# Patient Record
Sex: Female | Born: 1980 | Race: White | Hispanic: No | Smoking: Current every day smoker
Health system: Southern US, Community
[De-identification: ages and names within clinical notes are randomized; demographics above are authoritative.]

## PROBLEM LIST (undated history)

## (undated) DIAGNOSIS — IMO0002 Reserved for concepts with insufficient information to code with codable children: Secondary | ICD-10-CM

## (undated) DIAGNOSIS — F410 Panic disorder [episodic paroxysmal anxiety] without agoraphobia: Secondary | ICD-10-CM

## (undated) DIAGNOSIS — N200 Calculus of kidney: Secondary | ICD-10-CM

## (undated) DIAGNOSIS — G8929 Other chronic pain: Secondary | ICD-10-CM

## (undated) DIAGNOSIS — R102 Pelvic and perineal pain unspecified side: Secondary | ICD-10-CM

## (undated) DIAGNOSIS — S0990XA Unspecified injury of head, initial encounter: Secondary | ICD-10-CM

## (undated) DIAGNOSIS — N289 Disorder of kidney and ureter, unspecified: Secondary | ICD-10-CM

## (undated) DIAGNOSIS — F32A Depression, unspecified: Secondary | ICD-10-CM

## (undated) DIAGNOSIS — F419 Anxiety disorder, unspecified: Secondary | ICD-10-CM

## (undated) DIAGNOSIS — F319 Bipolar disorder, unspecified: Secondary | ICD-10-CM

## (undated) DIAGNOSIS — F4310 Post-traumatic stress disorder, unspecified: Secondary | ICD-10-CM

## (undated) DIAGNOSIS — F1910 Other psychoactive substance abuse, uncomplicated: Secondary | ICD-10-CM

## (undated) DIAGNOSIS — F329 Major depressive disorder, single episode, unspecified: Secondary | ICD-10-CM

## (undated) HISTORY — PX: KIDNEY STONE SURGERY: SHX686

## (undated) HISTORY — PX: LEEP: SHX91

---

## 2001-01-08 ENCOUNTER — Emergency Department (HOSPITAL_COMMUNITY): Admission: EM | Admit: 2001-01-08 | Discharge: 2001-01-08 | Payer: Self-pay | Admitting: Internal Medicine

## 2001-07-11 ENCOUNTER — Emergency Department (HOSPITAL_COMMUNITY): Admission: EM | Admit: 2001-07-11 | Discharge: 2001-07-11 | Payer: Self-pay | Admitting: Emergency Medicine

## 2001-08-25 ENCOUNTER — Emergency Department (HOSPITAL_COMMUNITY): Admission: EM | Admit: 2001-08-25 | Discharge: 2001-08-25 | Payer: Self-pay | Admitting: *Deleted

## 2001-09-06 ENCOUNTER — Emergency Department (HOSPITAL_COMMUNITY): Admission: EM | Admit: 2001-09-06 | Discharge: 2001-09-07 | Payer: Self-pay | Admitting: Emergency Medicine

## 2001-09-07 ENCOUNTER — Emergency Department (HOSPITAL_COMMUNITY): Admission: EM | Admit: 2001-09-07 | Discharge: 2001-09-07 | Payer: Self-pay | Admitting: Emergency Medicine

## 2001-10-10 ENCOUNTER — Emergency Department (HOSPITAL_COMMUNITY): Admission: EM | Admit: 2001-10-10 | Discharge: 2001-10-10 | Payer: Self-pay | Admitting: Emergency Medicine

## 2001-12-26 ENCOUNTER — Emergency Department (HOSPITAL_COMMUNITY): Admission: EM | Admit: 2001-12-26 | Discharge: 2001-12-26 | Payer: Self-pay | Admitting: *Deleted

## 2002-01-02 ENCOUNTER — Emergency Department (HOSPITAL_COMMUNITY): Admission: EM | Admit: 2002-01-02 | Discharge: 2002-01-02 | Payer: Self-pay | Admitting: Emergency Medicine

## 2002-03-11 ENCOUNTER — Emergency Department (HOSPITAL_COMMUNITY): Admission: EM | Admit: 2002-03-11 | Discharge: 2002-03-11 | Payer: Self-pay | Admitting: Emergency Medicine

## 2002-09-30 ENCOUNTER — Emergency Department (HOSPITAL_COMMUNITY): Admission: EM | Admit: 2002-09-30 | Discharge: 2002-10-01 | Payer: Self-pay | Admitting: Emergency Medicine

## 2003-01-05 ENCOUNTER — Encounter: Payer: Self-pay | Admitting: Emergency Medicine

## 2003-01-05 ENCOUNTER — Emergency Department (HOSPITAL_COMMUNITY): Admission: EM | Admit: 2003-01-05 | Discharge: 2003-01-05 | Payer: Self-pay | Admitting: Emergency Medicine

## 2003-03-13 ENCOUNTER — Encounter: Payer: Self-pay | Admitting: Emergency Medicine

## 2003-03-13 ENCOUNTER — Emergency Department (HOSPITAL_COMMUNITY): Admission: EM | Admit: 2003-03-13 | Discharge: 2003-03-13 | Payer: Self-pay | Admitting: Emergency Medicine

## 2003-06-30 ENCOUNTER — Emergency Department (HOSPITAL_COMMUNITY): Admission: EM | Admit: 2003-06-30 | Discharge: 2003-06-30 | Payer: Self-pay | Admitting: *Deleted

## 2003-12-18 ENCOUNTER — Emergency Department (HOSPITAL_COMMUNITY): Admission: EM | Admit: 2003-12-18 | Discharge: 2003-12-18 | Payer: Self-pay | Admitting: Emergency Medicine

## 2004-02-21 ENCOUNTER — Emergency Department (HOSPITAL_COMMUNITY): Admission: EM | Admit: 2004-02-21 | Discharge: 2004-02-22 | Payer: Self-pay | Admitting: Emergency Medicine

## 2004-02-27 ENCOUNTER — Emergency Department (HOSPITAL_COMMUNITY): Admission: EM | Admit: 2004-02-27 | Discharge: 2004-02-27 | Payer: Self-pay | Admitting: *Deleted

## 2004-03-05 ENCOUNTER — Emergency Department (HOSPITAL_COMMUNITY): Admission: EM | Admit: 2004-03-05 | Discharge: 2004-03-05 | Payer: Self-pay | Admitting: *Deleted

## 2005-06-26 ENCOUNTER — Emergency Department (HOSPITAL_COMMUNITY): Admission: EM | Admit: 2005-06-26 | Discharge: 2005-06-26 | Payer: Self-pay | Admitting: Emergency Medicine

## 2005-11-20 ENCOUNTER — Ambulatory Visit: Payer: Self-pay | Admitting: Orthopedic Surgery

## 2006-04-25 ENCOUNTER — Emergency Department (HOSPITAL_COMMUNITY): Admission: EM | Admit: 2006-04-25 | Discharge: 2006-04-25 | Payer: Self-pay | Admitting: Emergency Medicine

## 2006-06-23 ENCOUNTER — Emergency Department (HOSPITAL_COMMUNITY): Admission: EM | Admit: 2006-06-23 | Discharge: 2006-06-23 | Payer: Self-pay | Admitting: Emergency Medicine

## 2006-09-14 ENCOUNTER — Emergency Department (HOSPITAL_COMMUNITY): Admission: EM | Admit: 2006-09-14 | Discharge: 2006-09-14 | Payer: Self-pay | Admitting: Emergency Medicine

## 2006-11-06 ENCOUNTER — Emergency Department (HOSPITAL_COMMUNITY): Admission: EM | Admit: 2006-11-06 | Discharge: 2006-11-06 | Payer: Self-pay | Admitting: Emergency Medicine

## 2006-11-08 ENCOUNTER — Emergency Department (HOSPITAL_COMMUNITY): Admission: EM | Admit: 2006-11-08 | Discharge: 2006-11-08 | Payer: Self-pay | Admitting: Emergency Medicine

## 2006-11-10 ENCOUNTER — Other Ambulatory Visit: Admission: RE | Admit: 2006-11-10 | Discharge: 2006-11-10 | Payer: Self-pay | Admitting: Unknown Physician Specialty

## 2006-11-10 ENCOUNTER — Encounter (INDEPENDENT_AMBULATORY_CARE_PROVIDER_SITE_OTHER): Payer: Self-pay | Admitting: Specialist

## 2007-01-19 ENCOUNTER — Emergency Department (HOSPITAL_COMMUNITY): Admission: EM | Admit: 2007-01-19 | Discharge: 2007-01-19 | Payer: Self-pay | Admitting: Emergency Medicine

## 2007-03-10 ENCOUNTER — Emergency Department (HOSPITAL_COMMUNITY): Admission: EM | Admit: 2007-03-10 | Discharge: 2007-03-10 | Payer: Self-pay | Admitting: Emergency Medicine

## 2007-04-17 ENCOUNTER — Emergency Department (HOSPITAL_COMMUNITY): Admission: EM | Admit: 2007-04-17 | Discharge: 2007-04-17 | Payer: Self-pay | Admitting: Emergency Medicine

## 2007-07-12 ENCOUNTER — Emergency Department (HOSPITAL_COMMUNITY): Admission: EM | Admit: 2007-07-12 | Discharge: 2007-07-13 | Payer: Self-pay | Admitting: Emergency Medicine

## 2007-07-12 ENCOUNTER — Emergency Department (HOSPITAL_COMMUNITY): Admission: EM | Admit: 2007-07-12 | Discharge: 2007-07-12 | Payer: Self-pay | Admitting: Emergency Medicine

## 2007-08-02 ENCOUNTER — Emergency Department (HOSPITAL_COMMUNITY): Admission: EM | Admit: 2007-08-02 | Discharge: 2007-08-02 | Payer: Self-pay | Admitting: Emergency Medicine

## 2007-10-03 ENCOUNTER — Emergency Department (HOSPITAL_COMMUNITY): Admission: EM | Admit: 2007-10-03 | Discharge: 2007-10-03 | Payer: Self-pay | Admitting: Emergency Medicine

## 2007-10-19 ENCOUNTER — Emergency Department (HOSPITAL_COMMUNITY): Admission: EM | Admit: 2007-10-19 | Discharge: 2007-10-19 | Payer: Self-pay | Admitting: Emergency Medicine

## 2007-10-21 ENCOUNTER — Emergency Department (HOSPITAL_COMMUNITY): Admission: EM | Admit: 2007-10-21 | Discharge: 2007-10-21 | Payer: Self-pay | Admitting: Emergency Medicine

## 2008-01-03 ENCOUNTER — Emergency Department (HOSPITAL_COMMUNITY): Admission: EM | Admit: 2008-01-03 | Discharge: 2008-01-04 | Payer: Self-pay | Admitting: Emergency Medicine

## 2008-05-01 ENCOUNTER — Emergency Department (HOSPITAL_COMMUNITY): Admission: EM | Admit: 2008-05-01 | Discharge: 2008-05-01 | Payer: Self-pay | Admitting: Emergency Medicine

## 2008-05-31 ENCOUNTER — Emergency Department (HOSPITAL_COMMUNITY): Admission: EM | Admit: 2008-05-31 | Discharge: 2008-05-31 | Payer: Self-pay | Admitting: Emergency Medicine

## 2008-06-15 ENCOUNTER — Emergency Department (HOSPITAL_COMMUNITY): Admission: EM | Admit: 2008-06-15 | Discharge: 2008-06-15 | Payer: Self-pay | Admitting: Emergency Medicine

## 2008-06-20 ENCOUNTER — Emergency Department (HOSPITAL_COMMUNITY): Admission: EM | Admit: 2008-06-20 | Discharge: 2008-06-20 | Payer: Self-pay | Admitting: Emergency Medicine

## 2008-07-10 ENCOUNTER — Emergency Department (HOSPITAL_COMMUNITY): Admission: EM | Admit: 2008-07-10 | Discharge: 2008-07-10 | Payer: Self-pay | Admitting: Emergency Medicine

## 2008-09-25 ENCOUNTER — Emergency Department (HOSPITAL_COMMUNITY): Admission: EM | Admit: 2008-09-25 | Discharge: 2008-09-26 | Payer: Self-pay | Admitting: Emergency Medicine

## 2009-04-30 ENCOUNTER — Emergency Department (HOSPITAL_COMMUNITY): Admission: EM | Admit: 2009-04-30 | Discharge: 2009-04-30 | Payer: Self-pay | Admitting: Emergency Medicine

## 2009-05-28 ENCOUNTER — Emergency Department (HOSPITAL_COMMUNITY): Admission: EM | Admit: 2009-05-28 | Discharge: 2009-05-28 | Payer: Self-pay | Admitting: Emergency Medicine

## 2009-08-20 ENCOUNTER — Emergency Department (HOSPITAL_COMMUNITY): Admission: EM | Admit: 2009-08-20 | Discharge: 2009-08-20 | Payer: Self-pay | Admitting: Emergency Medicine

## 2009-09-03 ENCOUNTER — Emergency Department (HOSPITAL_COMMUNITY): Admission: EM | Admit: 2009-09-03 | Discharge: 2009-09-03 | Payer: Self-pay | Admitting: Emergency Medicine

## 2009-09-19 ENCOUNTER — Emergency Department (HOSPITAL_COMMUNITY): Admission: EM | Admit: 2009-09-19 | Discharge: 2009-09-20 | Payer: Self-pay | Admitting: Emergency Medicine

## 2009-10-11 ENCOUNTER — Emergency Department (HOSPITAL_COMMUNITY): Admission: EM | Admit: 2009-10-11 | Discharge: 2009-10-11 | Payer: Self-pay | Admitting: Emergency Medicine

## 2009-11-15 ENCOUNTER — Emergency Department (HOSPITAL_COMMUNITY): Admission: EM | Admit: 2009-11-15 | Discharge: 2009-11-15 | Payer: Self-pay | Admitting: Emergency Medicine

## 2009-11-16 ENCOUNTER — Ambulatory Visit (HOSPITAL_COMMUNITY): Admission: RE | Admit: 2009-11-16 | Discharge: 2009-11-16 | Payer: Self-pay | Admitting: Emergency Medicine

## 2010-01-07 ENCOUNTER — Emergency Department (HOSPITAL_COMMUNITY): Admission: EM | Admit: 2010-01-07 | Discharge: 2010-01-07 | Payer: Self-pay | Admitting: Emergency Medicine

## 2010-01-21 ENCOUNTER — Emergency Department (HOSPITAL_COMMUNITY): Admission: EM | Admit: 2010-01-21 | Discharge: 2010-01-22 | Payer: Self-pay | Admitting: Emergency Medicine

## 2010-02-05 ENCOUNTER — Other Ambulatory Visit: Admission: RE | Admit: 2010-02-05 | Discharge: 2010-02-05 | Payer: Self-pay | Admitting: Unknown Physician Specialty

## 2010-05-09 ENCOUNTER — Emergency Department (HOSPITAL_COMMUNITY): Admission: EM | Admit: 2010-05-09 | Discharge: 2010-05-09 | Payer: Self-pay | Admitting: Internal Medicine

## 2010-05-12 ENCOUNTER — Emergency Department (HOSPITAL_COMMUNITY): Admission: EM | Admit: 2010-05-12 | Discharge: 2010-05-13 | Payer: Self-pay | Admitting: Emergency Medicine

## 2010-05-28 ENCOUNTER — Emergency Department (HOSPITAL_COMMUNITY): Admission: EM | Admit: 2010-05-28 | Discharge: 2010-05-28 | Payer: Self-pay | Admitting: Emergency Medicine

## 2010-09-24 LAB — URINALYSIS, ROUTINE W REFLEX MICROSCOPIC
Bilirubin Urine: NEGATIVE
Nitrite: NEGATIVE
Specific Gravity, Urine: 1.015 (ref 1.005–1.030)
Urobilinogen, UA: 0.2 mg/dL (ref 0.0–1.0)

## 2010-09-25 LAB — WET PREP, GENITAL
Trich, Wet Prep: NONE SEEN
WBC, Wet Prep HPF POC: NONE SEEN
Yeast Wet Prep HPF POC: NONE SEEN

## 2010-09-25 LAB — URINALYSIS, ROUTINE W REFLEX MICROSCOPIC
Bilirubin Urine: NEGATIVE
Glucose, UA: NEGATIVE mg/dL
Hgb urine dipstick: NEGATIVE
Ketones, ur: NEGATIVE mg/dL
Ketones, ur: NEGATIVE mg/dL
Nitrite: NEGATIVE
Nitrite: NEGATIVE
Protein, ur: NEGATIVE mg/dL
Protein, ur: NEGATIVE mg/dL
Specific Gravity, Urine: >1.03 — ABNORMAL HIGH (ref 1.005–1.030)
Urobilinogen, UA: 0.2 mg/dL (ref 0.0–1.0)
pH: 6 (ref 5.0–8.0)

## 2010-09-25 LAB — GC/CHLAMYDIA PROBE AMP, GENITAL
Chlamydia, DNA Probe: NEGATIVE
GC Probe Amp, Genital: NEGATIVE

## 2010-09-29 LAB — WET PREP, GENITAL: Trich, Wet Prep: NONE SEEN

## 2010-09-29 LAB — URINALYSIS, ROUTINE W REFLEX MICROSCOPIC
Bilirubin Urine: NEGATIVE
Glucose, UA: NEGATIVE mg/dL
Hgb urine dipstick: NEGATIVE
Ketones, ur: NEGATIVE mg/dL
Protein, ur: NEGATIVE mg/dL

## 2010-10-01 LAB — URINALYSIS, ROUTINE W REFLEX MICROSCOPIC
Glucose, UA: NEGATIVE mg/dL
Ketones, ur: NEGATIVE mg/dL
Nitrite: NEGATIVE
Protein, ur: NEGATIVE mg/dL

## 2010-10-01 LAB — WET PREP, GENITAL

## 2010-10-01 LAB — POCT PREGNANCY, URINE: Preg Test, Ur: NEGATIVE

## 2010-10-02 LAB — URINALYSIS, ROUTINE W REFLEX MICROSCOPIC
Bilirubin Urine: NEGATIVE
Hgb urine dipstick: NEGATIVE
Specific Gravity, Urine: 1.02 (ref 1.005–1.030)
Urobilinogen, UA: 1 mg/dL (ref 0.0–1.0)

## 2010-10-02 LAB — DIFFERENTIAL
Basophils Absolute: 0 10*3/uL (ref 0.0–0.1)
Basophils Relative: 1 % (ref 0–1)
Eosinophils Relative: 3 % (ref 0–5)
Monocytes Absolute: 0.5 10*3/uL (ref 0.1–1.0)
Monocytes Relative: 7 % (ref 3–12)

## 2010-10-02 LAB — CBC
HCT: 36 % (ref 36.0–46.0)
Hemoglobin: 12.2 g/dL (ref 12.0–15.0)
MCHC: 34 g/dL (ref 30.0–36.0)
RDW: 13 % (ref 11.5–15.5)

## 2010-10-02 LAB — BASIC METABOLIC PANEL
CO2: 27 mEq/L (ref 19–32)
Chloride: 106 mEq/L (ref 96–112)
Glucose, Bld: 87 mg/dL (ref 70–99)
Potassium: 3.5 mEq/L (ref 3.5–5.1)
Sodium: 139 mEq/L (ref 135–145)

## 2010-10-02 LAB — URINE CULTURE
Colony Count: NO GROWTH
Culture: NO GROWTH

## 2010-10-02 LAB — WET PREP, GENITAL: Yeast Wet Prep HPF POC: NONE SEEN

## 2010-10-02 LAB — PREGNANCY, URINE: Preg Test, Ur: NEGATIVE

## 2010-10-04 LAB — CBC
MCV: 94.5 fL (ref 78.0–100.0)
Platelets: 256 10*3/uL (ref 150–400)
RDW: 13.5 % (ref 11.5–15.5)
WBC: 6.4 10*3/uL (ref 4.0–10.5)

## 2010-10-04 LAB — URINALYSIS, ROUTINE W REFLEX MICROSCOPIC
Bilirubin Urine: NEGATIVE
Ketones, ur: NEGATIVE mg/dL
Leukocytes, UA: NEGATIVE
Nitrite: NEGATIVE
Protein, ur: NEGATIVE mg/dL
Urobilinogen, UA: 0.2 mg/dL (ref 0.0–1.0)
pH: 5.5 (ref 5.0–8.0)

## 2010-10-04 LAB — WET PREP, GENITAL
Trich, Wet Prep: NONE SEEN
WBC, Wet Prep HPF POC: NONE SEEN
Yeast Wet Prep HPF POC: NONE SEEN

## 2010-10-04 LAB — BASIC METABOLIC PANEL
BUN: 14 mg/dL (ref 6–23)
Chloride: 107 mEq/L (ref 96–112)
Creatinine, Ser: 0.76 mg/dL (ref 0.4–1.2)
GFR calc non Af Amer: >60 mL/min (ref >60)
Glucose, Bld: 96 mg/dL (ref 70–99)

## 2010-10-04 LAB — DIFFERENTIAL
Basophils Absolute: 0 10*3/uL (ref 0.0–0.1)
Eosinophils Absolute: 0.2 10*3/uL (ref 0.0–0.7)
Eosinophils Relative: 2 % (ref 0–5)
Lymphs Abs: 2.7 10*3/uL (ref 0.7–4.0)
Neutrophils Relative %: 48 % (ref 43–77)

## 2010-10-04 LAB — PREGNANCY, URINE: Preg Test, Ur: NEGATIVE

## 2010-10-04 LAB — URINE MICROSCOPIC-ADD ON

## 2010-10-04 LAB — RAPID STREP SCREEN (MED CTR MEBANE ONLY): Streptococcus, Group A Screen (Direct): NEGATIVE

## 2010-10-17 ENCOUNTER — Emergency Department (HOSPITAL_COMMUNITY)
Admission: EM | Admit: 2010-10-17 | Discharge: 2010-10-18 | Disposition: A | Discharge status: Home / Self Care | Payer: Self-pay | Attending: Emergency Medicine | Admitting: Emergency Medicine

## 2010-10-17 DIAGNOSIS — M549 Dorsalgia, unspecified: Secondary | ICD-10-CM | POA: Insufficient documentation

## 2010-10-17 DIAGNOSIS — R109 Unspecified abdominal pain: Secondary | ICD-10-CM | POA: Insufficient documentation

## 2010-10-17 DIAGNOSIS — N739 Female pelvic inflammatory disease, unspecified: Secondary | ICD-10-CM | POA: Insufficient documentation

## 2010-10-17 DIAGNOSIS — F17200 Nicotine dependence, unspecified, uncomplicated: Secondary | ICD-10-CM | POA: Insufficient documentation

## 2010-10-17 LAB — URINALYSIS, ROUTINE W REFLEX MICROSCOPIC
Bilirubin Urine: NEGATIVE
Glucose, UA: NEGATIVE mg/dL
Hgb urine dipstick: NEGATIVE
Hgb urine dipstick: NEGATIVE
Ketones, ur: NEGATIVE mg/dL
Ketones, ur: NEGATIVE mg/dL
Nitrite: NEGATIVE
Protein, ur: NEGATIVE mg/dL
Specific Gravity, Urine: >1.03 — ABNORMAL HIGH (ref 1.005–1.030)
Urobilinogen, UA: 1 mg/dL (ref 0.0–1.0)
Urobilinogen, UA: 0.2 mg/dL (ref 0.0–1.0)
pH: 6 (ref 5.0–8.0)

## 2010-10-17 LAB — BASIC METABOLIC PANEL
BUN: 14 mg/dL (ref 6–23)
Chloride: 104 mEq/L (ref 96–112)
Creatinine, Ser: 0.67 mg/dL (ref 0.4–1.2)
Glucose, Bld: 97 mg/dL (ref 70–99)
Potassium: 3.9 mEq/L (ref 3.5–5.1)

## 2010-10-17 LAB — CBC
HCT: 37.2 % (ref 36.0–46.0)
MCH: 32.5 pg (ref 26.0–34.0)
MCV: 96.6 fL (ref 78.0–100.0)
RBC: 3.85 MIL/uL — ABNORMAL LOW (ref 3.87–5.11)
WBC: 7.1 10*3/uL (ref 4.0–10.5)

## 2010-10-17 LAB — DIFFERENTIAL
Eosinophils Absolute: 0.2 10*3/uL (ref 0.0–0.7)
Eosinophils Relative: 3 % (ref 0–5)
Lymphocytes Relative: 45 % (ref 12–46)
Lymphs Abs: 3.2 10*3/uL (ref 0.7–4.0)
Monocytes Relative: 5 % (ref 3–12)
Neutrophils Relative %: 47 % (ref 43–77)

## 2010-10-17 LAB — PREGNANCY, URINE: Preg Test, Ur: NEGATIVE

## 2010-10-17 LAB — WET PREP, GENITAL

## 2010-10-18 ENCOUNTER — Emergency Department (HOSPITAL_COMMUNITY)
Admission: EM | Admit: 2010-10-18 | Discharge: 2010-10-19 | Disposition: A | Discharge status: Home / Self Care | Payer: Self-pay | Attending: Emergency Medicine | Admitting: Emergency Medicine

## 2010-10-18 DIAGNOSIS — F341 Dysthymic disorder: Secondary | ICD-10-CM | POA: Insufficient documentation

## 2010-10-18 DIAGNOSIS — R109 Unspecified abdominal pain: Secondary | ICD-10-CM | POA: Insufficient documentation

## 2010-10-18 DIAGNOSIS — J45909 Unspecified asthma, uncomplicated: Secondary | ICD-10-CM | POA: Insufficient documentation

## 2010-10-18 DIAGNOSIS — N739 Female pelvic inflammatory disease, unspecified: Secondary | ICD-10-CM | POA: Insufficient documentation

## 2010-10-18 DIAGNOSIS — R10819 Abdominal tenderness, unspecified site: Secondary | ICD-10-CM | POA: Insufficient documentation

## 2010-10-18 DIAGNOSIS — F319 Bipolar disorder, unspecified: Secondary | ICD-10-CM | POA: Insufficient documentation

## 2010-10-18 DIAGNOSIS — Z79899 Other long term (current) drug therapy: Secondary | ICD-10-CM | POA: Insufficient documentation

## 2010-10-21 LAB — GC/CHLAMYDIA PROBE AMP, GENITAL
Chlamydia, DNA Probe: NEGATIVE
GC Probe Amp, Genital: NEGATIVE

## 2010-10-24 LAB — URINE CULTURE
Colony Count: NO GROWTH
Culture: NO GROWTH

## 2010-10-24 LAB — URINE MICROSCOPIC-ADD ON

## 2010-10-24 LAB — WET PREP, GENITAL
Trich, Wet Prep: NONE SEEN
Yeast Wet Prep HPF POC: NONE SEEN

## 2010-10-24 LAB — URINALYSIS, ROUTINE W REFLEX MICROSCOPIC
Bilirubin Urine: NEGATIVE
Glucose, UA: NEGATIVE mg/dL
Ketones, ur: NEGATIVE mg/dL
Leukocytes, UA: NEGATIVE
Nitrite: NEGATIVE
Protein, ur: NEGATIVE mg/dL
Specific Gravity, Urine: >1.03 — ABNORMAL HIGH (ref 1.005–1.030)
Urobilinogen, UA: 0.2 mg/dL (ref 0.0–1.0)
pH: 6 (ref 5.0–8.0)

## 2010-10-24 LAB — GC/CHLAMYDIA PROBE AMP, GENITAL
Chlamydia, DNA Probe: NEGATIVE
GC Probe Amp, Genital: NEGATIVE

## 2010-10-24 LAB — HEMOGLOBIN AND HEMATOCRIT, BLOOD
HCT: 37.5 % (ref 36.0–46.0)
Hemoglobin: 13 g/dL (ref 12.0–15.0)

## 2010-10-24 LAB — PREGNANCY, URINE: Preg Test, Ur: NEGATIVE

## 2010-10-26 ENCOUNTER — Emergency Department (HOSPITAL_COMMUNITY): Payer: Self-pay

## 2010-10-26 ENCOUNTER — Emergency Department (HOSPITAL_COMMUNITY)
Admission: EM | Admit: 2010-10-26 | Discharge: 2010-10-26 | Disposition: A | Discharge status: Home / Self Care | Payer: Self-pay | Attending: Emergency Medicine | Admitting: Emergency Medicine

## 2010-10-26 DIAGNOSIS — J45909 Unspecified asthma, uncomplicated: Secondary | ICD-10-CM | POA: Insufficient documentation

## 2010-10-26 DIAGNOSIS — K644 Residual hemorrhoidal skin tags: Secondary | ICD-10-CM | POA: Insufficient documentation

## 2010-10-26 DIAGNOSIS — R109 Unspecified abdominal pain: Secondary | ICD-10-CM | POA: Insufficient documentation

## 2010-10-26 DIAGNOSIS — F341 Dysthymic disorder: Secondary | ICD-10-CM | POA: Insufficient documentation

## 2010-10-26 DIAGNOSIS — L02419 Cutaneous abscess of limb, unspecified: Secondary | ICD-10-CM | POA: Insufficient documentation

## 2010-10-26 DIAGNOSIS — F319 Bipolar disorder, unspecified: Secondary | ICD-10-CM | POA: Insufficient documentation

## 2010-10-26 DIAGNOSIS — G8929 Other chronic pain: Secondary | ICD-10-CM | POA: Insufficient documentation

## 2010-10-26 DIAGNOSIS — L03119 Cellulitis of unspecified part of limb: Secondary | ICD-10-CM | POA: Insufficient documentation

## 2010-10-26 DIAGNOSIS — Z79899 Other long term (current) drug therapy: Secondary | ICD-10-CM | POA: Insufficient documentation

## 2010-10-26 DIAGNOSIS — R110 Nausea: Secondary | ICD-10-CM | POA: Insufficient documentation

## 2010-10-26 DIAGNOSIS — N949 Unspecified condition associated with female genital organs and menstrual cycle: Secondary | ICD-10-CM | POA: Insufficient documentation

## 2010-10-26 LAB — CBC
HCT: 37.5 % (ref 36.0–46.0)
Hemoglobin: 12.4 g/dL (ref 12.0–15.0)
MCHC: 33.1 g/dL (ref 30.0–36.0)
MCV: 96.9 fL (ref 78.0–100.0)
RDW: 13.8 % (ref 11.5–15.5)

## 2010-10-26 LAB — COMPREHENSIVE METABOLIC PANEL
Alkaline Phosphatase: 65 U/L (ref 39–117)
BUN: 15 mg/dL (ref 6–23)
CO2: 28 mEq/L (ref 19–32)
GFR calc non Af Amer: >60 mL/min (ref >60)
Glucose, Bld: 84 mg/dL (ref 70–99)
Potassium: 3.9 mEq/L (ref 3.5–5.1)
Total Protein: 6.3 g/dL (ref 6.0–8.3)

## 2010-10-26 LAB — URINALYSIS, ROUTINE W REFLEX MICROSCOPIC
Bilirubin Urine: NEGATIVE
Glucose, UA: NEGATIVE mg/dL
Hgb urine dipstick: NEGATIVE
pH: 6 (ref 5.0–8.0)

## 2010-10-26 LAB — LIPASE, BLOOD: Lipase: 34 U/L (ref 11–59)

## 2010-10-26 LAB — DIFFERENTIAL
Eosinophils Relative: 2 % (ref 0–5)
Lymphocytes Relative: 33 % (ref 12–46)
Lymphs Abs: 2.7 10*3/uL (ref 0.7–4.0)
Monocytes Absolute: 0.6 10*3/uL (ref 0.1–1.0)

## 2010-10-26 MED ORDER — IOHEXOL 300 MG/ML  SOLN
100.0000 mL | Freq: Once | INTRAMUSCULAR | Status: AC | PRN
  Administered 2010-10-26: 100 mL via INTRAVENOUS

## 2010-10-29 ENCOUNTER — Emergency Department (HOSPITAL_COMMUNITY)
Admission: EM | Admit: 2010-10-29 | Discharge: 2010-10-29 | Disposition: A | Discharge status: Home / Self Care | Payer: Self-pay | Attending: Emergency Medicine | Admitting: Emergency Medicine

## 2010-10-29 DIAGNOSIS — G8929 Other chronic pain: Secondary | ICD-10-CM | POA: Insufficient documentation

## 2010-10-29 DIAGNOSIS — R109 Unspecified abdominal pain: Secondary | ICD-10-CM | POA: Insufficient documentation

## 2010-10-29 LAB — PREGNANCY, URINE: Preg Test, Ur: NEGATIVE

## 2010-10-29 LAB — URINALYSIS, ROUTINE W REFLEX MICROSCOPIC
Glucose, UA: NEGATIVE mg/dL
Ketones, ur: NEGATIVE mg/dL
Nitrite: NEGATIVE
Specific Gravity, Urine: 1.025 (ref 1.005–1.030)
pH: 6 (ref 5.0–8.0)

## 2010-10-29 LAB — RAPID URINE DRUG SCREEN, HOSP PERFORMED
Barbiturates: NOT DETECTED
Benzodiazepines: POSITIVE — AB

## 2010-11-13 ENCOUNTER — Emergency Department (HOSPITAL_COMMUNITY)
Admission: EM | Admit: 2010-11-13 | Discharge: 2010-11-13 | Disposition: A | Discharge status: Home / Self Care | Payer: Self-pay | Attending: Emergency Medicine | Admitting: Emergency Medicine

## 2010-11-13 DIAGNOSIS — X58XXXA Exposure to other specified factors, initial encounter: Secondary | ICD-10-CM | POA: Insufficient documentation

## 2010-11-13 DIAGNOSIS — F3130 Bipolar disorder, current episode depressed, mild or moderate severity, unspecified: Secondary | ICD-10-CM | POA: Insufficient documentation

## 2010-11-13 DIAGNOSIS — IMO0002 Reserved for concepts with insufficient information to code with codable children: Secondary | ICD-10-CM | POA: Insufficient documentation

## 2010-11-13 DIAGNOSIS — Z79899 Other long term (current) drug therapy: Secondary | ICD-10-CM | POA: Insufficient documentation

## 2010-11-13 DIAGNOSIS — F411 Generalized anxiety disorder: Secondary | ICD-10-CM | POA: Insufficient documentation

## 2010-11-13 DIAGNOSIS — J45909 Unspecified asthma, uncomplicated: Secondary | ICD-10-CM | POA: Insufficient documentation

## 2010-11-13 LAB — URINALYSIS, ROUTINE W REFLEX MICROSCOPIC
Bilirubin Urine: NEGATIVE
Glucose, UA: NEGATIVE mg/dL
Ketones, ur: NEGATIVE mg/dL
Protein, ur: NEGATIVE mg/dL
Urobilinogen, UA: 0.2 mg/dL (ref 0.0–1.0)

## 2010-11-13 LAB — PREGNANCY, URINE: Preg Test, Ur: NEGATIVE

## 2010-11-13 LAB — WET PREP, GENITAL
Trich, Wet Prep: NONE SEEN
Yeast Wet Prep HPF POC: NONE SEEN

## 2010-11-13 LAB — URINE MICROSCOPIC-ADD ON

## 2010-11-14 LAB — GC/CHLAMYDIA PROBE AMP, GENITAL: GC Probe Amp, Genital: NEGATIVE

## 2010-11-14 LAB — RPR: RPR Ser Ql: NONREACTIVE

## 2010-12-17 ENCOUNTER — Emergency Department (HOSPITAL_COMMUNITY)
Admission: EM | Admit: 2010-12-17 | Discharge: 2010-12-17 | Disposition: A | Discharge status: Home / Self Care | Payer: Self-pay | Attending: Emergency Medicine | Admitting: Emergency Medicine

## 2010-12-17 DIAGNOSIS — F17200 Nicotine dependence, unspecified, uncomplicated: Secondary | ICD-10-CM | POA: Insufficient documentation

## 2010-12-17 DIAGNOSIS — K089 Disorder of teeth and supporting structures, unspecified: Secondary | ICD-10-CM | POA: Insufficient documentation

## 2010-12-18 ENCOUNTER — Emergency Department (HOSPITAL_COMMUNITY)
Admission: EM | Admit: 2010-12-18 | Discharge: 2010-12-18 | Disposition: A | Discharge status: Home / Self Care | Payer: Self-pay | Attending: Emergency Medicine | Admitting: Emergency Medicine

## 2010-12-18 DIAGNOSIS — F17200 Nicotine dependence, unspecified, uncomplicated: Secondary | ICD-10-CM | POA: Insufficient documentation

## 2010-12-18 DIAGNOSIS — K089 Disorder of teeth and supporting structures, unspecified: Secondary | ICD-10-CM | POA: Insufficient documentation

## 2010-12-18 DIAGNOSIS — K029 Dental caries, unspecified: Secondary | ICD-10-CM | POA: Insufficient documentation

## 2010-12-21 ENCOUNTER — Emergency Department (HOSPITAL_COMMUNITY)
Admission: EM | Admit: 2010-12-21 | Discharge: 2010-12-21 | Disposition: A | Discharge status: Home / Self Care | Payer: Self-pay | Attending: Emergency Medicine | Admitting: Emergency Medicine

## 2010-12-21 DIAGNOSIS — R51 Headache: Secondary | ICD-10-CM | POA: Insufficient documentation

## 2010-12-21 DIAGNOSIS — F319 Bipolar disorder, unspecified: Secondary | ICD-10-CM | POA: Insufficient documentation

## 2010-12-21 DIAGNOSIS — F341 Dysthymic disorder: Secondary | ICD-10-CM | POA: Insufficient documentation

## 2010-12-21 DIAGNOSIS — K089 Disorder of teeth and supporting structures, unspecified: Secondary | ICD-10-CM | POA: Insufficient documentation

## 2011-01-01 ENCOUNTER — Emergency Department (HOSPITAL_COMMUNITY)
Admission: EM | Admit: 2011-01-01 | Discharge: 2011-01-01 | Disposition: A | Discharge status: Home / Self Care | Payer: Self-pay | Attending: Emergency Medicine | Admitting: Emergency Medicine

## 2011-01-01 DIAGNOSIS — K089 Disorder of teeth and supporting structures, unspecified: Secondary | ICD-10-CM | POA: Insufficient documentation

## 2011-01-01 DIAGNOSIS — N898 Other specified noninflammatory disorders of vagina: Secondary | ICD-10-CM | POA: Insufficient documentation

## 2011-01-01 DIAGNOSIS — R350 Frequency of micturition: Secondary | ICD-10-CM | POA: Insufficient documentation

## 2011-01-01 LAB — URINALYSIS, ROUTINE W REFLEX MICROSCOPIC
Leukocytes, UA: NEGATIVE
Nitrite: NEGATIVE
Specific Gravity, Urine: 1.025 (ref 1.005–1.030)
Urobilinogen, UA: 0.2 mg/dL (ref 0.0–1.0)

## 2011-01-01 LAB — WET PREP, GENITAL
Clue Cells Wet Prep HPF POC: NONE SEEN
Trich, Wet Prep: NONE SEEN
WBC, Wet Prep HPF POC: NONE SEEN
Yeast Wet Prep HPF POC: NONE SEEN

## 2011-01-01 LAB — URINE MICROSCOPIC-ADD ON

## 2011-01-03 LAB — URINE CULTURE
Colony Count: NO GROWTH
Culture  Setup Time: 201206212202
Culture: NO GROWTH

## 2011-01-03 LAB — GC/CHLAMYDIA PROBE AMP, GENITAL
Chlamydia, DNA Probe: NEGATIVE
GC Probe Amp, Genital: NEGATIVE

## 2011-01-16 ENCOUNTER — Emergency Department (HOSPITAL_COMMUNITY)
Admission: EM | Admit: 2011-01-16 | Discharge: 2011-01-16 | Disposition: A | Discharge status: Home / Self Care | Payer: Self-pay | Attending: Emergency Medicine | Admitting: Emergency Medicine

## 2011-01-16 DIAGNOSIS — J45909 Unspecified asthma, uncomplicated: Secondary | ICD-10-CM | POA: Insufficient documentation

## 2011-01-16 DIAGNOSIS — M549 Dorsalgia, unspecified: Secondary | ICD-10-CM | POA: Insufficient documentation

## 2011-01-16 DIAGNOSIS — F17200 Nicotine dependence, unspecified, uncomplicated: Secondary | ICD-10-CM | POA: Insufficient documentation

## 2011-01-16 DIAGNOSIS — F319 Bipolar disorder, unspecified: Secondary | ICD-10-CM | POA: Insufficient documentation

## 2011-01-16 DIAGNOSIS — F410 Panic disorder [episodic paroxysmal anxiety] without agoraphobia: Secondary | ICD-10-CM | POA: Insufficient documentation

## 2011-01-21 ENCOUNTER — Emergency Department (HOSPITAL_COMMUNITY)
Admission: EM | Admit: 2011-01-21 | Discharge: 2011-01-21 | Discharge status: Against Medical Advice | Payer: Self-pay | Attending: *Deleted | Admitting: *Deleted

## 2011-01-21 DIAGNOSIS — Z5309 Procedure and treatment not carried out because of other contraindication: Secondary | ICD-10-CM | POA: Insufficient documentation

## 2011-01-21 NOTE — ED Notes (Signed)
Informed by ED registration that pt left 9 minutes after signing into be seen; pt never presented to triage and did not notify this RN or staff prior to leaving department.

## 2011-01-22 ENCOUNTER — Emergency Department (HOSPITAL_COMMUNITY)
Admission: EM | Admit: 2011-01-22 | Discharge: 2011-01-22 | Disposition: A | Discharge status: Home / Self Care | Payer: Self-pay | Attending: Emergency Medicine | Admitting: Emergency Medicine

## 2011-01-22 ENCOUNTER — Encounter: Payer: Self-pay | Admitting: *Deleted

## 2011-01-22 DIAGNOSIS — F17200 Nicotine dependence, unspecified, uncomplicated: Secondary | ICD-10-CM | POA: Insufficient documentation

## 2011-01-22 DIAGNOSIS — F319 Bipolar disorder, unspecified: Secondary | ICD-10-CM | POA: Insufficient documentation

## 2011-01-22 DIAGNOSIS — J45909 Unspecified asthma, uncomplicated: Secondary | ICD-10-CM | POA: Insufficient documentation

## 2011-01-22 DIAGNOSIS — K029 Dental caries, unspecified: Secondary | ICD-10-CM | POA: Insufficient documentation

## 2011-01-22 DIAGNOSIS — N739 Female pelvic inflammatory disease, unspecified: Secondary | ICD-10-CM | POA: Insufficient documentation

## 2011-01-22 DIAGNOSIS — N730 Acute parametritis and pelvic cellulitis: Secondary | ICD-10-CM

## 2011-01-22 HISTORY — DX: Bipolar disorder, unspecified: F31.9

## 2011-01-22 HISTORY — DX: Depression, unspecified: F32.A

## 2011-01-22 HISTORY — DX: Anxiety disorder, unspecified: F41.9

## 2011-01-22 HISTORY — DX: Major depressive disorder, single episode, unspecified: F32.9

## 2011-01-22 HISTORY — DX: Post-traumatic stress disorder, unspecified: F43.10

## 2011-01-22 LAB — CBC
MCH: 32.5 pg (ref 26.0–34.0)
MCV: 96.1 fL (ref 78.0–100.0)
Platelets: 338 10*3/uL (ref 150–400)
RBC: 4.15 MIL/uL (ref 3.87–5.11)
RDW: 13 % (ref 11.5–15.5)
WBC: 7.7 10*3/uL (ref 4.0–10.5)

## 2011-01-22 LAB — URINALYSIS, ROUTINE W REFLEX MICROSCOPIC
Bilirubin Urine: NEGATIVE
Glucose, UA: NEGATIVE mg/dL
Hgb urine dipstick: NEGATIVE
Ketones, ur: NEGATIVE mg/dL
Specific Gravity, Urine: 1.025 (ref 1.005–1.030)
pH: 6.5 (ref 5.0–8.0)

## 2011-01-22 LAB — BASIC METABOLIC PANEL
Calcium: 9.5 mg/dL (ref 8.4–10.5)
Creatinine, Ser: 0.69 mg/dL (ref 0.50–1.10)
GFR calc non Af Amer: >60 mL/min (ref >60)
Sodium: 140 mEq/L (ref 135–145)

## 2011-01-22 LAB — WET PREP, GENITAL: Trich, Wet Prep: NONE SEEN

## 2011-01-22 LAB — PREGNANCY, URINE: Preg Test, Ur: NEGATIVE

## 2011-01-22 MED ORDER — CEFTRIAXONE SODIUM 1 G IJ SOLR
INTRAMUSCULAR | Status: AC
  Filled 2011-01-22: qty 1

## 2011-01-22 MED ORDER — HYDROMORPHONE HCL 2 MG/ML IJ SOLN
1.0000 mg | Freq: Once | INTRAMUSCULAR | Status: AC
  Administered 2011-01-22: 1 mg via INTRAVENOUS
  Filled 2011-01-22: qty 1

## 2011-01-22 MED ORDER — SODIUM CHLORIDE 0.9 % IV BOLUS (SEPSIS)
250.0000 mL | Freq: Once | INTRAVENOUS | Status: AC
  Administered 2011-01-22: 1000 mL via INTRAVENOUS

## 2011-01-22 MED ORDER — HYDROMORPHONE HCL 1 MG/ML IJ SOLN
INTRAMUSCULAR | Status: AC
  Filled 2011-01-22: qty 1

## 2011-01-22 MED ORDER — ONDANSETRON HCL 4 MG/2ML IJ SOLN
4.0000 mg | Freq: Once | INTRAMUSCULAR | Status: AC
  Administered 2011-01-22: 4 mg via INTRAVENOUS
  Filled 2011-01-22: qty 2

## 2011-01-22 MED ORDER — ONDANSETRON HCL 4 MG PO TABS: 4.0000 mg | ORAL_TABLET | Freq: Four times a day (QID) | ORAL | Status: AC

## 2011-01-22 MED ORDER — HYDROCODONE-ACETAMINOPHEN 5-325 MG PO TABS: 2.0000 | ORAL_TABLET | ORAL | Status: AC | PRN

## 2011-01-22 MED ORDER — SODIUM CHLORIDE 0.9 % IV SOLN: INTRAVENOUS | Status: DC

## 2011-01-22 MED ORDER — DEXTROSE 5 % IV SOLN
INTRAVENOUS | Status: AC
  Filled 2011-01-22: qty 1

## 2011-01-22 MED ORDER — DEXTROSE 5 % IV SOLN
1.0000 g | Freq: Once | INTRAVENOUS | Status: AC
  Administered 2011-01-22: 1 g via INTRAVENOUS
  Filled 2011-01-22: qty 1

## 2011-01-22 MED ORDER — HYDROMORPHONE HCL 2 MG/ML IJ SOLN
1.0000 mg | Freq: Once | INTRAMUSCULAR | Status: AC
  Administered 2011-01-22: 1 mg via INTRAVENOUS

## 2011-01-22 MED ORDER — DOXYCYCLINE HYCLATE 100 MG PO CAPS: 100.0000 mg | ORAL_CAPSULE | Freq: Two times a day (BID) | ORAL | Status: AC

## 2011-01-22 NOTE — ED Notes (Signed)
Bedside report given to oncoming shift. Assuming care of pt. Pt resting on left side with eyes open. Family at [facility]. States she is having 10\10 abd pain. Would like something else for pain, something to eat and drink as well. Denies any other needs. Call bell at bs. MD made aware.

## 2011-01-22 NOTE — ED Notes (Signed)
Pt up to bathroom. Ambulates well with steady gait.

## 2011-01-22 NOTE — ED Notes (Signed)
Pt cont to wait to be eval by edp. Pt constantly asking for something to eat and drink

## 2011-01-22 NOTE — ED Notes (Signed)
Remains resting in bed on left side. Nad. Equal chest rise and fall. Ns complete; iv saline locked with no signs of infiltration. Friend at bs. Denies needs. Will cont to monitor.

## 2011-01-22 NOTE — ED Notes (Signed)
Resting quietly in bed on left side. Equal chest rise and fall. No facial grimaces. Nad. Friend at bs. Denies any needs. Will cont to monitor.

## 2011-01-22 NOTE — ED Notes (Signed)
Pt waiting to be eval by edp. Delay explained

## 2011-01-22 NOTE — ED Notes (Signed)
Into room to see pt. Resting sitting up in bed. Pain 5/10. Stats she is ready to go home. Call bell at bs. Friend at bs. Denies any needs. md made aware.

## 2011-01-22 NOTE — ED Provider Notes (Signed)
History     Chief Complaint  Patient presents with  . Abdominal Pain   HPI Comments: Pt also c/o toothache in right upper molar, not new for pt, but flared up 3 days ago and has been constant since.  Patient is a 30 y.o. female presenting with abdominal pain. The history is provided by the patient.  Abdominal Pain The primary symptoms of the illness include abdominal pain and vaginal discharge. The primary symptoms of the illness do not include fever, shortness of breath, nausea, vomiting or diarrhea. The current episode started more than 2 days ago (3-4 days ago). The onset of the illness was gradual. The problem has been gradually worsening.  The abdominal pain is located in the LLQ and RLQ. The abdominal pain radiates to the left flank. The severity of the abdominal pain is 10/10. The abdominal pain is relieved by nothing.  Onset: 3 days. Vaginal discharge is a new problem. The patient believes that the vaginal discharge is unchanged since it began.  Additional symptoms associated with the illness include back pain. Symptoms associated with the illness do not include chills, constipation, urgency, hematuria or frequency. Associated symptoms comments: +hesitancy.    Past Medical History  Diagnosis Date  . Depression   . Asthma   . Anxiety   . Bipolar 1 disorder   . PTSD (post-traumatic stress disorder)     Past Surgical History  Procedure Date  . Kidney stone surgery     Family History  Problem Relation Age of Onset  . Thyroid disease Father   . Heart failure Father   . Diabetes Father   . Hypertension Father     History  Substance Use Topics  . Smoking status: Current Everyday Smoker -- 2.0 packs/day    Types: Cigarettes  . Smokeless tobacco: Not on file  . Alcohol Use: Yes     beer occasionally     OB History    Grav Para Term Preterm Abortions TAB SAB Ect Mult Living                  Review of Systems  Constitutional: Negative for fever and chills.  HENT:  Positive for dental problem. Negative for ear pain, congestion, sore throat, rhinorrhea, trouble swallowing and neck pain.   Eyes: Negative for redness.  Respiratory: Negative for cough and shortness of breath.   Cardiovascular: Negative for chest pain and leg swelling.  Gastrointestinal: Positive for abdominal pain. Negative for nausea, vomiting, diarrhea and constipation.  Genitourinary: Positive for flank pain and vaginal discharge. Negative for urgency, frequency and hematuria.  Musculoskeletal: Positive for back pain.  Skin: Negative for rash.  Neurological: Negative for headaches.    Physical Exam  BP 114/51  Pulse 60  Temp(Src) 98.8 F (37.1 C) (Oral)  Resp 20  Ht 5' (1.524 m)  Wt 161 lb (73.029 kg)  BMI 31.44 kg/m2  SpO2 100%  LMP 01/07/2011  Physical Exam  Nursing note and vitals reviewed. Constitutional: She is oriented to person, place, and time. She appears well-developed and well-nourished. No distress.  HENT:  Head: Normocephalic and atraumatic.       Widespread dental decay with tenderness right upper molar  Eyes: Conjunctivae and EOM are normal. Pupils are equal, round, and reactive to light.  Neck: Normal range of motion. Neck supple.  Cardiovascular: Normal rate, regular rhythm and normal heart sounds.   Pulmonary/Chest: Effort normal and breath sounds normal. She has no wheezes. She exhibits no tenderness.  Abdominal: Soft.  Bowel sounds are normal. There is tenderness.       LLQ, RLQ and left CVA tenderness  Genitourinary: Uterus is tender. Cervix exhibits motion tenderness. Right adnexum displays no tenderness. Left adnexum displays no tenderness. No bleeding around the vagina. Vaginal discharge found.       Purulent discharge  Musculoskeletal: Normal range of motion. She exhibits no edema and no tenderness.  Lymphadenopathy:    She has no cervical adenopathy.  Neurological: She is alert and oriented to person, place, and time. She has normal strength. No  cranial nerve deficit or sensory deficit.  Skin: Skin is warm and dry. No rash noted.  Psychiatric: She has a normal mood and affect.    ED Course  I personally performed the services described in this documentation, which was scribed in my presence. The recorded information has been reviewed and considered.  Procedures Written by Enos Fling acting as scribe for Dr. Deretha Emory.  MDM EXAM CW PID NO TRICH. RX IN ED WITH ROCEPHIN, NOT PREGNANT. CAN ONLY BE RX WITH DOXY AND NOT FLAGYL DUE TO ALLERGY.       Shelda Jakes, MD 01/22/11 2249

## 2011-01-22 NOTE — ED Notes (Signed)
Pt waiting to have pelvic exam by edp

## 2011-01-22 NOTE — ED Notes (Signed)
Rocephin infusion complete. Iv saline locked. No signs of infiltration. No adverse reaction. Pt up to bathroom. Steady gait.

## 2011-01-22 NOTE — ED Notes (Signed)
Pt c/o left lower quadrant pain, urinary frequency, burning on urination. Also c/o left jaw swelling and toothache left upper jaw.

## 2011-01-22 NOTE — ED Notes (Signed)
Pt state she is having pain in the left side of her jaw. Also, her belly hurts and she thinks she has a STD

## 2011-01-22 NOTE — ED Notes (Signed)
Into room to see pt. Resting sitting up in bed. Pain 5\10. Given Sprite per request. Denies any other needs. Eating chips and tolerating well. Family at bs. Call bell at bs.

## 2011-01-22 NOTE — ED Notes (Signed)
Resting sitting up in bed. Nad. Equal chest rise and fall. Denies any needs. Pain 5\10. Call bell and friend at bs. Will continue to monitor.

## 2011-01-22 NOTE — ED Notes (Signed)
Upon d/c, iv dc with cathlon intact. No signs of infiltration and bleeding controlled.

## 2011-01-24 LAB — GC/CHLAMYDIA PROBE AMP, GENITAL: Chlamydia, DNA Probe: NEGATIVE

## 2011-02-05 ENCOUNTER — Other Ambulatory Visit: Payer: Self-pay

## 2011-02-05 ENCOUNTER — Encounter (HOSPITAL_COMMUNITY): Payer: Self-pay | Admitting: Emergency Medicine

## 2011-02-05 ENCOUNTER — Emergency Department (HOSPITAL_COMMUNITY)
Admission: EM | Admit: 2011-02-05 | Discharge: 2011-02-05 | Disposition: A | Discharge status: Home / Self Care | Payer: Self-pay | Attending: Emergency Medicine | Admitting: Emergency Medicine

## 2011-02-05 DIAGNOSIS — F411 Generalized anxiety disorder: Secondary | ICD-10-CM

## 2011-02-05 DIAGNOSIS — J45909 Unspecified asthma, uncomplicated: Secondary | ICD-10-CM | POA: Insufficient documentation

## 2011-02-05 DIAGNOSIS — R071 Chest pain on breathing: Secondary | ICD-10-CM | POA: Insufficient documentation

## 2011-02-05 DIAGNOSIS — R0789 Other chest pain: Secondary | ICD-10-CM

## 2011-02-05 DIAGNOSIS — F17200 Nicotine dependence, unspecified, uncomplicated: Secondary | ICD-10-CM | POA: Insufficient documentation

## 2011-02-05 DIAGNOSIS — F341 Dysthymic disorder: Secondary | ICD-10-CM | POA: Insufficient documentation

## 2011-02-05 MED ORDER — ONDANSETRON 8 MG PO TBDP
8.0000 mg | ORAL_TABLET | Freq: Once | ORAL | Status: AC
  Administered 2011-02-05: 8 mg via ORAL
  Filled 2011-02-05: qty 1

## 2011-02-05 MED ORDER — ALPRAZOLAM 0.5 MG PO TABS
1.0000 mg | ORAL_TABLET | Freq: Once | ORAL | Status: AC
  Administered 2011-02-05: 1 mg via ORAL
  Filled 2011-02-05: qty 2

## 2011-02-05 NOTE — ED Notes (Signed)
Pt c/o waking up feeling anxious with chest hurting and feels like she can not breathe good. States has hx of panic attacks and currently has people living with her that she doesn't want there. Nondiaphoretic. Pain to L side of chest and into both arms that is sharp/stabbing/pressure and aching. nad at this time. No resp distress noted.
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
## 2011-02-05 NOTE — ED Notes (Signed)
Pt a/ox4. Resp even and unlabored. NAD at this time. D/C instructions reviewed with pt. Pt verbalized understanding. Pt escorted to d/c desk. Pt ambulated with steady gate. 

## 2011-02-06 ENCOUNTER — Emergency Department (HOSPITAL_COMMUNITY)
Admission: EM | Admit: 2011-02-06 | Discharge: 2011-02-06 | Disposition: A | Discharge status: Home / Self Care | Payer: Self-pay | Attending: Emergency Medicine | Admitting: Emergency Medicine

## 2011-02-06 ENCOUNTER — Other Ambulatory Visit: Payer: Self-pay

## 2011-02-06 ENCOUNTER — Encounter (HOSPITAL_COMMUNITY): Payer: Self-pay

## 2011-02-06 DIAGNOSIS — R079 Chest pain, unspecified: Secondary | ICD-10-CM | POA: Insufficient documentation

## 2011-02-06 DIAGNOSIS — F17200 Nicotine dependence, unspecified, uncomplicated: Secondary | ICD-10-CM | POA: Insufficient documentation

## 2011-02-06 DIAGNOSIS — F341 Dysthymic disorder: Secondary | ICD-10-CM | POA: Insufficient documentation

## 2011-02-06 DIAGNOSIS — Z87442 Personal history of urinary calculi: Secondary | ICD-10-CM | POA: Insufficient documentation

## 2011-02-06 DIAGNOSIS — J45909 Unspecified asthma, uncomplicated: Secondary | ICD-10-CM | POA: Insufficient documentation

## 2011-02-06 DIAGNOSIS — F4310 Post-traumatic stress disorder, unspecified: Secondary | ICD-10-CM | POA: Insufficient documentation

## 2011-02-06 DIAGNOSIS — F319 Bipolar disorder, unspecified: Secondary | ICD-10-CM | POA: Insufficient documentation

## 2011-02-06 MED ORDER — KETOROLAC TROMETHAMINE 60 MG/2ML IM SOLN
60.0000 mg | Freq: Once | INTRAMUSCULAR | Status: AC
  Administered 2011-02-06: 60 mg via INTRAMUSCULAR
  Filled 2011-02-06: qty 2

## 2011-02-06 NOTE — ED Notes (Signed)
Received report from [facility] [employer]. Pt rating chest pain 6 at this time. Had ekg done on arrival. Pt calm at this time and does not appears uncomfortable. NAD. Family at bedside.

## 2011-02-06 NOTE — ED Provider Notes (Addendum)
History     Chief Complaint  Patient presents with  . Chest Pain   Patient is a 30 y.o. female presenting with chest pain.  Chest Pain The chest pain began 2 days ago. Chest pain occurs intermittently. The chest pain is unchanged. Associated with: nothing. The severity of the pain is mild. The quality of the pain is described as sharp. The pain does not radiate. Chest pain is worsened by stress. Pertinent negatives for primary symptoms include no shortness of breath, no palpitations and no nausea.  Pertinent negatives for associated symptoms include no diaphoresis. She tried nothing for the symptoms. Risk factors include no known risk factors.   pt ran out of her psych meds.  Feeling back to normal now  Past Medical History  Diagnosis Date  . Depression   . Asthma   . Anxiety   . Bipolar 1 disorder   . PTSD (post-traumatic stress disorder)     Past Surgical History  Procedure Date  . Kidney stone surgery     Family History  Problem Relation Age of Onset  . Thyroid disease Father   . Heart failure Father   . Diabetes Father   . Hypertension Father     History  Substance Use Topics  . Smoking status: Current Everyday Smoker -- 2.0 packs/day    Types: Cigarettes  . Smokeless tobacco: Not on file  . Alcohol Use: Yes     beer occasionally     OB History    Grav Para Term Preterm Abortions TAB SAB Ect Mult Living                  Review of Systems  Constitutional: Negative for diaphoresis.  Respiratory: Negative for shortness of breath.   Cardiovascular: Positive for chest pain. Negative for palpitations.  Gastrointestinal: Negative for nausea.  All other systems reviewed and are negative.    Physical Exam  BP 101/56  Pulse 71  Temp 99.1 F (37.3 C)  Resp 18  Ht 5\' 6"  (1.676 m)  Wt 160 lb (72.576 kg)  BMI 25.82 kg/m2  SpO2 100%  LMP 01/07/2011  Physical Exam  Nursing note and vitals reviewed. Constitutional: She is oriented to person, place, and  time. She appears well-developed and well-nourished.  HENT:  Head: Normocephalic and atraumatic.       Low bp  Eyes: Conjunctivae and EOM are normal. Pupils are equal, round, and reactive to light.  Neck: Normal range of motion. Neck supple.  Cardiovascular: Normal rate and regular rhythm.   Pulmonary/Chest: Effort normal and breath sounds normal.  Abdominal: Soft. Bowel sounds are normal.  Musculoskeletal: Normal range of motion.  Neurological: She is alert and oriented to person, place, and time.  Skin: Skin is warm and dry.  Psychiatric: She has a normal mood and affect.    ED Course  Procedures  MDM Patient low risk for acute coronary syndrome or pulmonary embolus. She has run out of her psychiatric medications. Gave patient Toradol IM in the emergency department. Will followup as [place].     Date: 02/06/2011  Rate: 60  Rhythm: normal sinus rhythm  QRS Axis: normal  Intervals: normal  ST/T Wave abnormalities: normal  Conduction Disutrbances:none  Narrative Interpretation:   Old EKG Reviewed: none available   Donnetta Hutching, MD 02/06/11 1158  Donnetta Hutching, MD 03/12/11 2243

## 2011-02-06 NOTE — ED Notes (Signed)
CCM showing NSR. Pt states that she is scared to testify at court.

## 2011-02-06 NOTE — ED Notes (Signed)
Pt brought in by EMS for Chest pain. Pt [place] up [place] court house parking lot.
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
## 2011-02-06 NOTE — ED Provider Notes (Signed)
Medical screening examination/treatment/procedure(s) were performed by non-physician practitioner and as supervising physician I was immediately available for consultation/collaboration.   Lyanne Co, MD 02/06/11 2249

## 2011-03-19 ENCOUNTER — Encounter (HOSPITAL_COMMUNITY): Payer: Self-pay | Admitting: Emergency Medicine

## 2011-03-19 ENCOUNTER — Emergency Department (HOSPITAL_COMMUNITY): Payer: Self-pay

## 2011-03-19 ENCOUNTER — Emergency Department (HOSPITAL_COMMUNITY)
Admission: EM | Admit: 2011-03-19 | Discharge: 2011-03-20 | Disposition: A | Discharge status: Home / Self Care | Payer: Self-pay | Attending: Emergency Medicine | Admitting: Emergency Medicine

## 2011-03-19 DIAGNOSIS — F17200 Nicotine dependence, unspecified, uncomplicated: Secondary | ICD-10-CM | POA: Insufficient documentation

## 2011-03-19 DIAGNOSIS — F319 Bipolar disorder, unspecified: Secondary | ICD-10-CM | POA: Insufficient documentation

## 2011-03-19 DIAGNOSIS — M549 Dorsalgia, unspecified: Secondary | ICD-10-CM | POA: Insufficient documentation

## 2011-03-19 DIAGNOSIS — F341 Dysthymic disorder: Secondary | ICD-10-CM | POA: Insufficient documentation

## 2011-03-19 DIAGNOSIS — J45909 Unspecified asthma, uncomplicated: Secondary | ICD-10-CM | POA: Insufficient documentation

## 2011-03-19 DIAGNOSIS — F4310 Post-traumatic stress disorder, unspecified: Secondary | ICD-10-CM | POA: Insufficient documentation

## 2011-03-19 LAB — URINALYSIS, ROUTINE W REFLEX MICROSCOPIC
Ketones, ur: NEGATIVE mg/dL
Leukocytes, UA: NEGATIVE
Nitrite: NEGATIVE
Specific Gravity, Urine: 1.025 (ref 1.005–1.030)
pH: 6 (ref 5.0–8.0)

## 2011-03-19 LAB — PREGNANCY, URINE: Preg Test, Ur: NEGATIVE

## 2011-03-19 MED ORDER — ONDANSETRON HCL 4 MG PO TABS
4.0000 mg | ORAL_TABLET | Freq: Once | ORAL | Status: AC
  Administered 2011-03-19: 4 mg via ORAL
  Filled 2011-03-19: qty 1

## 2011-03-19 MED ORDER — DIAZEPAM 5 MG PO TABS
5.0000 mg | ORAL_TABLET | Freq: Once | ORAL | Status: AC
  Administered 2011-03-19: 5 mg via ORAL
  Filled 2011-03-19: qty 1

## 2011-03-19 MED ORDER — HYDROCODONE-ACETAMINOPHEN 5-325 MG PO TABS
2.0000 | ORAL_TABLET | Freq: Once | ORAL | Status: AC
  Administered 2011-03-19: 2 via ORAL
  Filled 2011-03-19: qty 2

## 2011-03-19 NOTE — ED Notes (Signed)
Patient c/o middle back pain and pelvic pain that started earlier today.  Patient also c/o hives x 3 weeks; states has been using Benadryl.

## 2011-03-19 NOTE — ED Provider Notes (Signed)
History     CSN: [id number] Arrival date & time: 03/19/2011  9:37 PM  Chief Complaint  Patient presents with  . Back Pain  . Pelvic Pain  . Urticaria   HPI Comments: Pt reports pain in the mid back and pelvis area. No injury or trauma. Similar episode 2 months ago. Pt request evaluation and treatment.  Patient is a 30 y.o. female presenting with back pain, pelvic pain, and urticaria. The history is provided by the patient.  Back Pain  This is a new problem. The current episode started 6 to 12 hours ago. The problem occurs constantly. The pain is associated with no known injury. The pain is present in the thoracic spine. The quality of the pain is described as aching. The pain is at a severity of 8/10. The pain is moderate. Exacerbated by: standing, movement. The pain is the same all the time. Associated symptoms include pelvic pain. She has tried nothing for the symptoms. The treatment provided no relief.  Pelvic Pain  Urticaria    Past Medical History  Diagnosis Date  . Depression   . Asthma   . Anxiety   . Bipolar 1 disorder   . PTSD (post-traumatic stress disorder)     Past Surgical History  Procedure Date  . Kidney stone surgery     Family History  Problem Relation Age of Onset  . Thyroid disease Father   . Heart failure Father   . Diabetes Father   . Hypertension Father     History  Substance Use Topics  . Smoking status: Current Everyday Smoker -- 2.0 packs/day    Types: Cigarettes  . Smokeless tobacco: Not on file  . Alcohol Use: Yes     beer occasionally     OB History    Grav Para Term Preterm Abortions TAB SAB Ect Mult Living                  Review of Systems  Genitourinary: Positive for pelvic pain.  Musculoskeletal: Positive for back pain.    Physical Exam  BP 109/63  Pulse 73  Temp(Src) 98.9 F (37.2 C) (Oral)  Resp 20  Ht 5' (1.524 m)  Wt 167 lb 6.4 oz (75.932 kg)  BMI 32.69 kg/m2  SpO2 97%  LMP 02/12/2011  Physical Exam    Nursing note and vitals reviewed. Constitutional: She is oriented to person, place, and time. She appears well-developed and well-nourished.  Non-toxic appearance.  HENT:  Head: Normocephalic.  Right Ear: Tympanic membrane and external ear normal.  Left Ear: Tympanic membrane and external ear normal.  Eyes: EOM and lids are normal. Pupils are equal, round, and reactive to light.  Neck: Normal range of motion. Neck supple. Carotid bruit is not present.  Cardiovascular: Normal rate, regular rhythm, normal heart sounds, intact distal pulses and normal pulses.   Pulmonary/Chest: Breath sounds normal. No respiratory distress.  Abdominal: Soft. Bowel sounds are normal. There is no tenderness. There is no guarding.  Musculoskeletal: Normal range of motion.       Lower thoracic and lumbar area pain to palpation and attempted ROM.  Lymphadenopathy:       Head (right side): No submandibular adenopathy present.       Head (left side): No submandibular adenopathy present.    She has no cervical adenopathy.  Neurological: She is alert and oriented to person, place, and time. She has normal strength. No cranial nerve deficit or sensory deficit. She exhibits normal muscle tone.  No sensory deficits.  Skin: Skin is warm and dry.  Psychiatric: She has a normal mood and affect. Her speech is normal.    ED Course Test results reviewed with pt. Pt to see her PCP for recheck and management.  Procedures  MDM I have reviewed nursing notes, vital signs, and all appropriate lab and imaging results for this patient.    Results for orders placed during the hospital encounter of 03/19/11  PREGNANCY, URINE      Component Value Range   Preg Test, Ur NEGATIVE    URINALYSIS, ROUTINE W REFLEX MICROSCOPIC      Component Value Range   Color, Urine AMBER (*) YELLOW    Appearance CLOUDY (*) CLEAR    Specific Gravity, Urine 1.025  1.005 - 1.030    pH 6.0  5.0 - 8.0    Glucose, UA NEGATIVE  NEGATIVE (mg/dL)    Hgb urine dipstick NEGATIVE  NEGATIVE    Bilirubin Urine NEGATIVE  NEGATIVE    Ketones, ur NEGATIVE  NEGATIVE (mg/dL)   Protein, ur NEGATIVE  NEGATIVE (mg/dL)   Urobilinogen, UA 0.2  0.0 - 1.0 (mg/dL)   Nitrite NEGATIVE  NEGATIVE    Leukocytes, UA NEGATIVE  NEGATIVE    Dg Thoracic Spine W/swimmers  03/19/2011  *RADIOLOGY REPORT*  Clinical Data: Back pain  THORACIC SPINE - 2 VIEW + SWIMMERS  Comparison: 05/28/2010  Findings: Mild thoracic curvature, likely positional.  Normal alignment of the thoracic spine.  No abnormal anterior subluxation. No vertebral compression deformities.  Intervertebral disc space heights are preserved.  No paraspinal soft tissue swelling. No bone lesion or bone destruction suggested.  IMPRESSION: No displaced fractures identified.  Original Report Authenticated By: Marlon Pel, M.D.   Dg Lumbar Spine Complete  03/19/2011  *RADIOLOGY REPORT*  Clinical Data: Mid low back pain began today.  LUMBAR SPINE - COMPLETE 4+ VIEW  Comparison: 05/13/2010  Findings: Five lumbar type vertebra.  Lumbar vertebra and facet joints demonstrate normal alignment.  No vertebral compression deformities.  Intervertebral disc space heights are preserved.  No focal bone lesion or bone destruction demonstrated.  No significant changes since previous study.  IMPRESSION: No displaced fractures identified.  Original Report Authenticated By: Marlon Pel, M.D.     Kathie Dike, [place] 03/20/11 [phone]

## 2011-03-19 NOTE — ED Notes (Signed)
  Prior to administering medication pt states she has "hives" to her back. Back is red in spots and small bumps noted.

## 2011-03-20 MED ORDER — METHOCARBAMOL 500 MG PO TABS: ORAL_TABLET | ORAL | Status: DC

## 2011-03-20 MED ORDER — HYDROCODONE-ACETAMINOPHEN 5-325 MG PO TABS: ORAL_TABLET | ORAL | Status: DC

## 2011-03-20 NOTE — ED Notes (Signed)
Discharge instructions given and reviewed with patient.  Prescriptions given for Robaxin and Norco; effects and use explained.  Patient verbalized understanding of sedating effects of medications and to f/u with her regular doctor.  Patient ambulatory with steady gait at discharge.  Friend accompanied discharge to drive home.

## 2011-03-25 NOTE — ED Provider Notes (Signed)
Medical screening examination/treatment/procedure(s) were performed by non-physician practitioner and as supervising physician I was immediately available for consultation/collaboration.   Lyanne Co, MD 03/25/11 [phone]

## 2011-04-07 LAB — URINALYSIS, ROUTINE W REFLEX MICROSCOPIC
Bilirubin Urine: NEGATIVE
Hgb urine dipstick: NEGATIVE
Nitrite: NEGATIVE
Protein, ur: NEGATIVE
Urobilinogen, UA: 0.2

## 2011-04-07 LAB — GC/CHLAMYDIA PROBE AMP, GENITAL
Chlamydia, DNA Probe: NEGATIVE
GC Probe Amp, Genital: NEGATIVE

## 2011-04-07 LAB — PREGNANCY, URINE: Preg Test, Ur: NEGATIVE

## 2011-04-07 LAB — WET PREP, GENITAL: Yeast Wet Prep HPF POC: NONE SEEN

## 2011-04-08 LAB — CULTURE, ROUTINE-ABSCESS

## 2011-04-10 ENCOUNTER — Emergency Department (HOSPITAL_COMMUNITY)
Admission: EM | Admit: 2011-04-10 | Discharge: 2011-04-10 | Disposition: A | Discharge status: Home / Self Care | Payer: Self-pay | Attending: Emergency Medicine | Admitting: Emergency Medicine

## 2011-04-10 ENCOUNTER — Encounter (HOSPITAL_COMMUNITY): Payer: Self-pay | Admitting: *Deleted

## 2011-04-10 DIAGNOSIS — B9689 Other specified bacterial agents as the cause of diseases classified elsewhere: Secondary | ICD-10-CM

## 2011-04-10 DIAGNOSIS — R110 Nausea: Secondary | ICD-10-CM | POA: Insufficient documentation

## 2011-04-10 DIAGNOSIS — N72 Inflammatory disease of cervix uteri: Secondary | ICD-10-CM

## 2011-04-10 DIAGNOSIS — A499 Bacterial infection, unspecified: Secondary | ICD-10-CM | POA: Insufficient documentation

## 2011-04-10 DIAGNOSIS — M549 Dorsalgia, unspecified: Secondary | ICD-10-CM | POA: Insufficient documentation

## 2011-04-10 DIAGNOSIS — K644 Residual hemorrhoidal skin tags: Secondary | ICD-10-CM

## 2011-04-10 DIAGNOSIS — N760 Acute vaginitis: Secondary | ICD-10-CM | POA: Insufficient documentation

## 2011-04-10 DIAGNOSIS — R109 Unspecified abdominal pain: Secondary | ICD-10-CM | POA: Insufficient documentation

## 2011-04-10 DIAGNOSIS — F17200 Nicotine dependence, unspecified, uncomplicated: Secondary | ICD-10-CM | POA: Insufficient documentation

## 2011-04-10 DIAGNOSIS — Z79899 Other long term (current) drug therapy: Secondary | ICD-10-CM | POA: Insufficient documentation

## 2011-04-10 LAB — URINALYSIS, ROUTINE W REFLEX MICROSCOPIC
Bilirubin Urine: NEGATIVE
Bilirubin Urine: NEGATIVE
Glucose, UA: NEGATIVE
Glucose, UA: NEGATIVE mg/dL
Hgb urine dipstick: NEGATIVE
Ketones, ur: NEGATIVE
Ketones, ur: NEGATIVE mg/dL
Leukocytes, UA: NEGATIVE
Nitrite: NEGATIVE
Protein, ur: NEGATIVE mg/dL
Specific Gravity, Urine: 1.02 (ref 1.005–1.030)
Urobilinogen, UA: 0.2 mg/dL (ref 0.0–1.0)
pH: 6
pH: 6.5 (ref 5.0–8.0)

## 2011-04-10 LAB — WET PREP, GENITAL
Trich, Wet Prep: NONE SEEN
Yeast Wet Prep HPF POC: NONE SEEN

## 2011-04-10 LAB — POCT PREGNANCY, URINE: Preg Test, Ur: NEGATIVE

## 2011-04-10 LAB — COMPREHENSIVE METABOLIC PANEL
AST: 18
Albumin: 3.5
Calcium: 8.9
Creatinine, Ser: 0.78
GFR calc Af Amer: >60
Total Protein: 6.5

## 2011-04-10 LAB — CBC
MCHC: 35.1
MCV: 94.6
Platelets: 281

## 2011-04-10 LAB — URINE MICROSCOPIC-ADD ON

## 2011-04-10 LAB — DIFFERENTIAL
Eosinophils Relative: 1
Lymphocytes Relative: 39
Lymphs Abs: 3.3
Monocytes Absolute: 0.5

## 2011-04-10 MED ORDER — KETOROLAC TROMETHAMINE 60 MG/2ML IM SOLN
30.0000 mg | Freq: Once | INTRAMUSCULAR | Status: AC
  Administered 2011-04-10: 30 mg via INTRAMUSCULAR
  Filled 2011-04-10: qty 2

## 2011-04-10 MED ORDER — HYDROMORPHONE HCL 1 MG/ML IJ SOLN
1.0000 mg | Freq: Once | INTRAMUSCULAR | Status: AC
  Administered 2011-04-10: 1 mg via INTRAMUSCULAR
  Filled 2011-04-10: qty 1

## 2011-04-10 MED ORDER — CLINDAMYCIN HCL 300 MG PO CAPS: ORAL_CAPSULE | ORAL | Status: DC

## 2011-04-10 MED ORDER — CEFTRIAXONE SODIUM 250 MG IJ SOLR
250.0000 mg | Freq: Once | INTRAMUSCULAR | Status: AC
  Administered 2011-04-10: 250 mg via INTRAMUSCULAR
  Filled 2011-04-10: qty 250

## 2011-04-10 MED ORDER — AZITHROMYCIN 250 MG PO TABS
1000.0000 mg | ORAL_TABLET | Freq: Once | ORAL | Status: AC
  Administered 2011-04-10: 1000 mg via ORAL
  Filled 2011-04-10: qty 4

## 2011-04-10 MED ORDER — DOCUSATE SODIUM 100 MG PO CAPS: 100.0000 mg | ORAL_CAPSULE | Freq: Two times a day (BID) | ORAL | Status: AC

## 2011-04-10 MED ORDER — LIDOCAINE HCL (PF) 1 % IJ SOLN
INTRAMUSCULAR | Status: AC
  Administered 2011-04-10: 20:00:00
  Filled 2011-04-10: qty 5

## 2011-04-10 MED ORDER — HYDROCODONE-ACETAMINOPHEN 5-325 MG PO TABS: ORAL_TABLET | ORAL | Status: DC

## 2011-04-10 NOTE — ED Notes (Signed)
Pt. Changed to pelvic OB/GYN bed and prepared for pelvic.  Reports minimal relief with Toradol injection.

## 2011-04-10 NOTE — Discharge Instructions (Signed)
 Bacterial Vaginosis Bacterial vaginosis is an infection in the vagina. It is the most common vaginal infection in women of childbearing age. Symptoms of BV include:  Discharge.  Odor or smell.   Pain.   Itching.  Burning.   HOME CARE  Take medicine until it is all gone, even if you feel better.   Keep taking your medicine even if your period starts.   Do not have sex until you finish your medicine.   Ask about eating yogurt or taking pills with antibiotics.   Always wipe from front to back after going to the bathroom.   You may use a squirt bottle with warm water  to clean the area.   Do not use tampons or wear a pad.   Do not douche or use cream from the drug store.   Wash your hands often.   Practice safe sex. Use condoms. Only have one sex partner.   Tell your sexual partner that you have a vaginal infection. They should see their doctor and be treated if they have problems, such as a mild rash or itching.   Only take medicine as directed by your doctor.  GET HELP RIGHT AWAY IF YOU:  Develop pain in your belly.   Have to urinate (pee) more often or it burns when you urinate.   Develop a temperature of 102 F (38.9 C) or higher.   See blood in your urine and it is not time for your period.   Are not getting better after 3 days of treatment.  MAKE SURE YOU:   Understand these instructions.   Will watch your condition.   Will get help right away if you are not doing well or get worse.  Easy-to-Read style based on content from [facility], [place], [place]  Document Released: 04/08/2008 Document Re-Released: 04/27/2009 [facility] Patient Information 2011 [place], [place].  Cervicitis Cervicitis is pain and puffiness (swelling) of the cervix. The cervix is the bottom part of the womb (uterus).  HOME CARE  Do not have sex until your doctor says it is okay.   Do not have sex until your partner has been treated.   Tell your partner(s) to get  checked.   Take medicine (antibiotics) the way your doctor told you to. Take them until they are gone.   See your doctor for a follow-up visit as told.   Get your yearly physical and pelvic exams. This includes a Pap test.  CAUSES  An infection from sex (sexually transmitted disease).   Using a device to rinse the vagina (chemical douches).   Leaving a tampon in too long.   Using scented tampons.   Different forms of birth control (pills, sponges, foam, or cervical caps).   Putting things in the vagina.  GET HELP IF:  You have abnormal discharge from your vagina.   You have bleeding from your vagina after sex.   You are spotting or bleeding between periods.   You find out your sex partner has an infection that could have been passed to you.   You have a temperature by mouth above 102.   You have pain with sex.  MAKE SURE YOU:   Understand these instructions.   Will watch your condition.   Will get help right away if you are not doing well or get worse.  Easy-to-Read style based on content from [facility], [place], [place] . Document Released: 04/08/2008 Document Re-Released: 09/24/2009 [facility] Patient Information 2011 [place], [place].  Narcotic and benzodiazepine use may cause drowsiness, slowed breathing or dependence.  Please use with caution and do not drive, operate machinery or watch young children alone while taking them.  Taking combinations of these medications or drinking alcohol will potentiate these effects.

## 2011-04-10 NOTE — ED Notes (Signed)
Pt. Advises in confidence ---  She has had unprotected sex and thinks she may have a STD---reports odorous yellowish discharge--also her last menstrual period was in August and there is the possibility of pregnancy.

## 2011-04-10 NOTE — ED Provider Notes (Signed)
History   Chart scribed for Katherine Burgess. Katherine Lamas, MD by Enos Fling; the patient was seen in room APA06/APA06; this patient's care was started at 4:24 PM.    CSN: [id number] Arrival date & time: 04/10/2011  3:38 PM  Chief Complaint  Patient presents with  . Pelvic Pain  . Abdominal Pain  . Back Pain    HPI Katherine Burgess is a 30 y.o. female who presents to the Emergency Department complaining of abdominal pain. Pt states periumbilical and lower abd pain has been worsening since onset 3-4 days ago with associated nausea, urinary frequency, and vaginal discharge (yellow in color, foul smelling). Pain is radiating to her lower back. Denies vomiting, fever, chill, dysuria, hematuria, or vaginal bleeding. Pt states she is not taking birth control; LNMP 02/12/11, admits it is unusual for her to be irregular. Pt G1P0Ab1. Pt reports h/o STD, kidney infection, and UTI; admits to unprotected intercourse with new partner approx 10 days ago. Also c/o breast soreness and constipation recently. Last BM 4 days ago after dulcolax which produced a large amount of stool.  Past Medical History  Diagnosis Date  . Depression   . Asthma   . Anxiety   . Bipolar 1 disorder   . PTSD (post-traumatic stress disorder)     Past Surgical History  Procedure Date  . Kidney stone surgery     Family History  Problem Relation Age of Onset  . Thyroid disease Father   . Heart failure Father   . Diabetes Father   . Hypertension Father     History  Substance Use Topics  . Smoking status: Current Everyday Smoker -- 2.0 packs/day    Types: Cigarettes  . Smokeless tobacco: Not on file  . Alcohol Use: Yes     beer occasionally     OB History    Grav Para Term Preterm Abortions TAB SAB Ect Mult Living                  Review of Systems 10 Systems reviewed and are negative for acute change except as noted in the HPI.  Allergies  Bactrim; Flagyl; Onion; and Peanuts  Home Medications   Current Outpatient  Rx  Name Route Sig Dispense Refill  . ALPRAZOLAM 2 MG PO TABS Oral Take 2 mg by mouth 4 (four) times daily as needed. For anxiety     . DIPHENHYDRAMINE HCL 25 MG PO CAPS Oral Take 25 mg by mouth daily as needed. For allergies     . FAMOTIDINE 20 MG PO TABS Oral Take 20 mg by mouth 2 (two) times daily.      Marland Kitchen ALPRAZOLAM 1 MG PO TABS Oral Take 1 mg by mouth every 6 (six) hours as needed. For anxiety    . ARIPIPRAZOLE 5 MG PO TABS Oral Take 5 mg by mouth daily.      Marland Kitchen CLINDAMYCIN HCL 300 MG PO CAPS  1 tablet PO BID for 7 days 14 capsule 0  . DOCUSATE SODIUM 100 MG PO CAPS Oral Take 1 capsule (100 mg total) by mouth every 12 (twelve) hours. 20 capsule 0  . ESCITALOPRAM OXALATE 20 MG PO TABS Oral Take 20 mg by mouth daily.      Marland Kitchen HYDROCODONE-ACETAMINOPHEN 5-325 MG PO TABS  1 or 2 po q4h prn pain 15 tablet 0  . HYDROCODONE-ACETAMINOPHEN 5-325 MG PO TABS  1-2 tablets PO Q 4-6 hours prn pain 15 tablet 0  . METHOCARBAMOL 500 MG PO TABS  2 po tid for spasm 30 tablet 0  . ZOLPIDEM TARTRATE 10 MG PO TABS Oral Take 10 mg by mouth at bedtime.       BP 110/58  Pulse 76  Temp(Src) 98.5 F (36.9 C) (Oral)  Resp 17  Ht 5' (1.524 m)  Wt 169 lb 6.4 oz (76.839 kg)  BMI 33.08 kg/m2  SpO2 100%  LMP 02/12/2011  Physical Exam  Nursing note and vitals reviewed. Constitutional: She is oriented to person, place, and time. She appears well-developed and well-nourished. No distress.  HENT:  Head: Normocephalic and atraumatic.  Right Ear: External ear normal.  Left Ear: External ear normal.  Nose: Nose normal.  Mouth/Throat: Oropharynx is clear and moist.  Eyes:       Normal appearance.  Neck: Neck supple.  Cardiovascular: Normal rate and regular rhythm.   Pulmonary/Chest: Effort normal and breath sounds normal.  Abdominal: Soft. Bowel sounds are normal. There is tenderness (mild lower abd). There is no rebound and no guarding.       No CVA tenderness  Genitourinary: Rectal exam shows external hemorrhoid  (small). Uterus is not tender. Cervix exhibits motion tenderness. Right adnexum displays no mass and no tenderness. Left adnexum displays no mass and no tenderness. No tenderness or bleeding around the vagina. Vaginal discharge found.  Musculoskeletal: Normal range of motion.       Normal pulses  Neurological: She is alert and oriented to person, place, and time.       Motor intact in all extremities  Skin: Skin is warm and dry.       Color normal  Psychiatric: She has a normal mood and affect.    ED Course  Procedures (including critical care time)  Labs Reviewed  WET PREP, GENITAL - Abnormal; Notable for the following:    Clue Cells, Wet Prep FEW (*)    WBC, Wet Prep HPF POC FEW (*)    All other components within normal limits  URINALYSIS, ROUTINE W REFLEX MICROSCOPIC  POCT PREGNANCY, URINE  POCT PREGNANCY, URINE  GC/CHLAMYDIA PROBE AMP, GENITAL   All results reviewed and discussed, questions answered, pt agreeable with plan.  OTHER DATA REVIEWED: Nursing notes and vital signs reviewed. Prior records reviewed.  MEDS GIVEN IN ED: HYDROmorphone (DILAUDID) injection 1 mg   azithromycin (ZITHROMAX) tablet 1,000 mg   cefTRIAXone (ROCEPHIN) injection 250 mg   ketorolac (TORADOL) injection 30 mg (30 mg Intramuscular Given 04/10/11 1721)      MDM   Pt with h/o STD, UTI.  Not toxic appearing . Treated with IM toradol. Narcotics would make constipation worse.  Pelvic exam is pending, will test for STD's and wet prep.  No suspicion for appendicitis, low for diverticulitis at this time.  No fevers, RA sats are 100% and normal.    IMPRESSION: 1. Cervicitis   2. BV (bacterial vaginosis)     DISCHARGE MEDICATIONS: New Prescriptions   CLINDAMYCIN (CLEOCIN) 300 MG CAPSULE    1 tablet PO BID for 7 days   DOCUSATE SODIUM (COLACE) 100 MG CAPSULE    Take 1 capsule (100 mg total) by mouth every 12 (twelve) hours.   HYDROCODONE-ACETAMINOPHEN (NORCO) 5-325 MG PER TABLET    1-2 tablets PO  Q 4-6 hours prn pain    SCRIBE ATTESTATION: I personally performed the services described in this documentation, which was scribed in my presence. The recorded information has been reviewed and considered. Benjiman Sedgwick Y.      Due to cervical pain and tenderness,  lower abd pain, will treat as probably cervicitis, cultures ent, pt understands results wil be available later.  Some clue cells so will treat for BV as well, pt instructed to follow up with GYN next week.    Katherine Burgess. Katherine Lamas, MD 04/10/11 1941

## 2011-04-10 NOTE — ED Notes (Signed)
C/o low back and low abdominal pain, increased urinary urgency and frequency onset 3 days ago---rates pain 10 on 1-10 scale

## 2011-04-10 NOTE — ED Notes (Signed)
Pt c/o pelvic, abd and back pain x 3 days; pt c/o urinary frequency and trouble with having BM's' pt's last BM was x 3 days ago

## 2011-04-10 NOTE — ED Notes (Signed)
Assisted with pelvic--specimens taken to lab--Pt. Tolerated well.

## 2011-04-11 LAB — GC/CHLAMYDIA PROBE AMP, GENITAL
Chlamydia, DNA Probe: NEGATIVE
GC Probe Amp, Genital: NEGATIVE

## 2011-04-15 LAB — URINE CULTURE: Colony Count: NO GROWTH

## 2011-04-15 LAB — URINALYSIS, ROUTINE W REFLEX MICROSCOPIC
Glucose, UA: NEGATIVE
Ketones, ur: NEGATIVE
Nitrite: NEGATIVE
Protein, ur: NEGATIVE
pH: 6

## 2011-04-15 LAB — URINE MICROSCOPIC-ADD ON

## 2011-04-15 LAB — PREGNANCY, URINE: Preg Test, Ur: NEGATIVE

## 2011-04-17 LAB — URINALYSIS, ROUTINE W REFLEX MICROSCOPIC
Bilirubin Urine: NEGATIVE
Ketones, ur: NEGATIVE mg/dL
Nitrite: NEGATIVE
Protein, ur: NEGATIVE mg/dL
Specific Gravity, Urine: >1.03 — ABNORMAL HIGH (ref 1.005–1.030)
Urobilinogen, UA: 0.2 mg/dL (ref 0.0–1.0)
pH: 5.5 (ref 5.0–8.0)

## 2011-04-17 LAB — PREGNANCY, URINE
Preg Test, Ur: NEGATIVE
Preg Test, Ur: NEGATIVE

## 2011-04-17 LAB — URINE CULTURE: Colony Count: NO GROWTH

## 2011-04-17 LAB — GC/CHLAMYDIA PROBE AMP, GENITAL
Chlamydia, DNA Probe: NEGATIVE
GC Probe Amp, Genital: NEGATIVE

## 2011-04-17 LAB — WET PREP, GENITAL

## 2011-04-18 LAB — WET PREP, GENITAL
Trich, Wet Prep: NONE SEEN
WBC, Wet Prep HPF POC: NONE SEEN

## 2011-04-18 LAB — PREGNANCY, URINE: Preg Test, Ur: NEGATIVE

## 2011-04-18 LAB — GC/CHLAMYDIA PROBE AMP, GENITAL: GC Probe Amp, Genital: NEGATIVE

## 2011-04-24 LAB — STREP A DNA PROBE: Group A Strep Probe: NEGATIVE

## 2011-04-24 LAB — RAPID STREP SCREEN (MED CTR MEBANE ONLY): Streptococcus, Group A Screen (Direct): NEGATIVE

## 2011-04-30 ENCOUNTER — Encounter (HOSPITAL_COMMUNITY): Payer: Self-pay | Admitting: *Deleted

## 2011-04-30 ENCOUNTER — Emergency Department (HOSPITAL_COMMUNITY)
Admission: EM | Admit: 2011-04-30 | Discharge: 2011-04-30 | Disposition: A | Discharge status: Home / Self Care | Payer: Self-pay | Attending: Emergency Medicine | Admitting: Emergency Medicine

## 2011-04-30 DIAGNOSIS — K029 Dental caries, unspecified: Secondary | ICD-10-CM | POA: Insufficient documentation

## 2011-04-30 DIAGNOSIS — K089 Disorder of teeth and supporting structures, unspecified: Secondary | ICD-10-CM | POA: Insufficient documentation

## 2011-04-30 DIAGNOSIS — F17200 Nicotine dependence, unspecified, uncomplicated: Secondary | ICD-10-CM | POA: Insufficient documentation

## 2011-04-30 DIAGNOSIS — F411 Generalized anxiety disorder: Secondary | ICD-10-CM | POA: Insufficient documentation

## 2011-04-30 DIAGNOSIS — F4310 Post-traumatic stress disorder, unspecified: Secondary | ICD-10-CM | POA: Insufficient documentation

## 2011-04-30 DIAGNOSIS — K0381 Cracked tooth: Secondary | ICD-10-CM | POA: Insufficient documentation

## 2011-04-30 DIAGNOSIS — F319 Bipolar disorder, unspecified: Secondary | ICD-10-CM | POA: Insufficient documentation

## 2011-04-30 DIAGNOSIS — J45909 Unspecified asthma, uncomplicated: Secondary | ICD-10-CM | POA: Insufficient documentation

## 2011-04-30 DIAGNOSIS — K0889 Other specified disorders of teeth and supporting structures: Secondary | ICD-10-CM

## 2011-04-30 MED ORDER — PENICILLIN V POTASSIUM 250 MG PO TABS
500.0000 mg | ORAL_TABLET | Freq: Once | ORAL | Status: AC
  Administered 2011-04-30: 500 mg via ORAL
  Filled 2011-04-30: qty 1

## 2011-04-30 MED ORDER — IBUPROFEN 800 MG PO TABS
800.0000 mg | ORAL_TABLET | Freq: Once | ORAL | Status: AC
  Administered 2011-04-30: 800 mg via ORAL
  Filled 2011-04-30: qty 1

## 2011-04-30 MED ORDER — PENICILLIN V POTASSIUM 500 MG PO TABS: 500.0000 mg | ORAL_TABLET | Freq: Four times a day (QID) | ORAL | Status: AC

## 2011-04-30 MED ORDER — HYDROCODONE-ACETAMINOPHEN 5-325 MG PO TABS
1.0000 | ORAL_TABLET | Freq: Once | ORAL | Status: AC
  Administered 2011-04-30: 1 via ORAL
  Filled 2011-04-30: qty 1

## 2011-04-30 MED ORDER — HYDROCODONE-ACETAMINOPHEN 5-325 MG PO TABS: ORAL_TABLET | ORAL | Status: DC

## 2011-04-30 NOTE — ED Provider Notes (Signed)
History     CSN: [id number] Arrival date & time: 04/30/2011  7:15 PM   None     Chief Complaint  Patient presents with  . Dental Pain    (Consider location/radiation/quality/duration/timing/severity/associated sxs/prior treatment) HPI Comments: Pt has no dentist and has not taken anything for pain.  Patient is a 30 y.o. female presenting with tooth pain. The history is provided by the patient. No language interpreter was used.  Dental PainThe primary symptoms include mouth pain and dental injury. Episode onset: 3 days. The symptoms are unchanged. The symptoms occur constantly.    Past Medical History  Diagnosis Date  . Depression   . Asthma   . Anxiety   . Bipolar 1 disorder   . PTSD (post-traumatic stress disorder)     Past Surgical History  Procedure Date  . Kidney stone surgery     Family History  Problem Relation Age of Onset  . Thyroid disease Father   . Heart failure Father   . Diabetes Father   . Hypertension Father     History  Substance Use Topics  . Smoking status: Current Everyday Smoker -- 2.0 packs/day    Types: Cigarettes  . Smokeless tobacco: Not on file  . Alcohol Use: Yes     beer occasionally     OB History    Grav Para Term Preterm Abortions TAB SAB Ect Mult Living                  Review of Systems  HENT: Positive for dental problem.   All other systems reviewed and are negative.    Allergies  Bactrim; Flagyl; Onion; and Peanuts  Home Medications   Current Outpatient Rx  Name Route Sig Dispense Refill  . ALPRAZOLAM 2 MG PO TABS Oral Take 2 mg by mouth 4 (four) times daily as needed. For anxiety     . ARIPIPRAZOLE 5 MG PO TABS Oral Take 5 mg by mouth daily.      Marland Kitchen DIPHENHYDRAMINE HCL 25 MG PO CAPS Oral Take 25 mg by mouth daily as needed. For allergies     . ESCITALOPRAM OXALATE 20 MG PO TABS Oral Take 20 mg by mouth daily.      Marland Kitchen FAMOTIDINE 20 MG PO TABS Oral Take 20 mg by mouth 2 (two) times daily.      Marland Kitchen ALPRAZOLAM 1 MG  PO TABS Oral Take 1 mg by mouth every 6 (six) hours as needed. For anxiety    . CLINDAMYCIN HCL 300 MG PO CAPS  1 tablet PO BID for 7 days 14 capsule 0  . HYDROCODONE-ACETAMINOPHEN 5-325 MG PO TABS  1 or 2 po q4h prn pain 15 tablet 0  . HYDROCODONE-ACETAMINOPHEN 5-325 MG PO TABS  1-2 tablets PO Q 4-6 hours prn pain 15 tablet 0  . METHOCARBAMOL 500 MG PO TABS  2 po tid for spasm 30 tablet 0  . ZOLPIDEM TARTRATE 10 MG PO TABS Oral Take 10 mg by mouth at bedtime.       BP 117/64  Pulse 76  Temp(Src) 99 F (37.2 C) (Oral)  Resp 20  Ht 5' (1.524 m)  Wt 160 lb (72.576 kg)  BMI 31.25 kg/m2  SpO2 99%  LMP 02/12/2011  Physical Exam  Nursing note and vitals reviewed. Constitutional: She is oriented to person, place, and time. She appears well-developed and well-nourished. No distress.  HENT:  Head: Normocephalic and atraumatic.  Mouth/Throat: Uvula is midline. Abnormal dentition. Dental caries present. No  dental abscesses or uvula swelling. No oropharyngeal exudate, posterior oropharyngeal edema, posterior oropharyngeal erythema or tonsillar abscesses.    Eyes: EOM are normal.  Neck: Normal range of motion.  Cardiovascular: Normal rate, regular rhythm and normal heart sounds.   Pulmonary/Chest: Effort normal and breath sounds normal.  Abdominal: Soft. She exhibits no distension. There is no tenderness.  Musculoskeletal: Normal range of motion.  Lymphadenopathy:    She has no cervical adenopathy.  Neurological: She is alert and oriented to person, place, and time.  Skin: Skin is warm and dry.  Psychiatric: She has a normal mood and affect. Judgment normal.    ED Course  Procedures (including critical care time)  Labs Reviewed - No data to display No results found.   No diagnosis found.    MDM          Worthy Rancher, PA 04/30/11 2104  Worthy Rancher, PA 05/01/11 8119  Worthy Rancher, PA 05/01/11 0033  Worthy Rancher, PA 05/05/11 1030

## 2011-04-30 NOTE — ED Notes (Signed)
Pt reports pain to upper and lower teeth rt side, multiple broken teeth noted

## 2011-05-05 NOTE — ED Provider Notes (Signed)
Medical screening examination/treatment/procedure(s) were performed by non-physician practitioner and as supervising physician I was immediately available for consultation/collaboration.   Benny Lennert, MD 05/05/11 [phone]

## 2011-07-31 ENCOUNTER — Encounter (HOSPITAL_COMMUNITY): Payer: Self-pay

## 2011-07-31 ENCOUNTER — Emergency Department (HOSPITAL_COMMUNITY)
Admission: EM | Admit: 2011-07-31 | Discharge: 2011-07-31 | Disposition: A | Discharge status: Home / Self Care | Payer: Self-pay | Attending: Emergency Medicine | Admitting: Emergency Medicine

## 2011-07-31 DIAGNOSIS — F319 Bipolar disorder, unspecified: Secondary | ICD-10-CM | POA: Insufficient documentation

## 2011-07-31 DIAGNOSIS — Z79899 Other long term (current) drug therapy: Secondary | ICD-10-CM | POA: Insufficient documentation

## 2011-07-31 DIAGNOSIS — K089 Disorder of teeth and supporting structures, unspecified: Secondary | ICD-10-CM | POA: Insufficient documentation

## 2011-07-31 DIAGNOSIS — K0889 Other specified disorders of teeth and supporting structures: Secondary | ICD-10-CM

## 2011-07-31 DIAGNOSIS — J45909 Unspecified asthma, uncomplicated: Secondary | ICD-10-CM | POA: Insufficient documentation

## 2011-07-31 DIAGNOSIS — F341 Dysthymic disorder: Secondary | ICD-10-CM | POA: Insufficient documentation

## 2011-07-31 DIAGNOSIS — K029 Dental caries, unspecified: Secondary | ICD-10-CM | POA: Insufficient documentation

## 2011-07-31 MED ORDER — PENICILLIN V POTASSIUM 500 MG PO TABS: 500.0000 mg | ORAL_TABLET | Freq: Four times a day (QID) | ORAL | Status: AC

## 2011-07-31 MED ORDER — PENICILLIN V POTASSIUM 250 MG PO TABS
500.0000 mg | ORAL_TABLET | Freq: Once | ORAL | Status: AC
  Administered 2011-07-31: 500 mg via ORAL
  Filled 2011-07-31: qty 2

## 2011-07-31 MED ORDER — TRAMADOL HCL 50 MG PO TABS: 50.0000 mg | ORAL_TABLET | Freq: Four times a day (QID) | ORAL | Status: AC | PRN

## 2011-07-31 MED ORDER — OXYCODONE-ACETAMINOPHEN 5-325 MG PO TABS
2.0000 | ORAL_TABLET | Freq: Once | ORAL | Status: AC
  Administered 2011-07-31: 2 via ORAL
  Filled 2011-07-31: qty 2

## 2011-07-31 NOTE — ED Provider Notes (Signed)
History     CSN: [id number]  Arrival date & time 07/31/11  0145   First MD Initiated Contact with Patient 07/31/11 0226      Chief Complaint  Patient presents with  . Dental Pain    (Consider location/radiation/quality/duration/timing/severity/associated sxs/prior treatment) Patient is a 31 y.o. female presenting with tooth pain. The history is provided by the patient. No language interpreter was used.  Dental PainPrimary symptoms do not include mouth pain, dental injury, oral bleeding, oral lesions, headaches, fever, shortness of breath, sore throat or cough. The symptoms began more than 1 week ago. The symptoms are unchanged. The symptoms are new. The symptoms occur constantly.  Additional symptoms include: dental sensitivity to temperature. Additional symptoms do not include: gum swelling, gum tenderness, trismus, jaw pain, trouble swallowing, pain with swallowing, dry mouth, taste disturbance, smell disturbance, ear pain, swollen glands and fatigue.    Past Medical History  Diagnosis Date  . Depression   . Asthma   . Anxiety   . Bipolar 1 disorder   . PTSD (post-traumatic stress disorder)     Past Surgical History  Procedure Date  . Kidney stone surgery     Family History  Problem Relation Age of Onset  . Thyroid disease Father   . Heart failure Father   . Diabetes Father   . Hypertension Father     History  Substance Use Topics  . Smoking status: Current Everyday Smoker -- 2.0 packs/day    Types: Cigarettes  . Smokeless tobacco: Not on file  . Alcohol Use: Yes     beer occasionally     OB History    Grav Para Term Preterm Abortions TAB SAB Ect Mult Living                  Review of Systems  Constitutional: Negative for fever, activity change, appetite change and fatigue.  HENT: Positive for dental problem. Negative for ear pain, congestion, sore throat, rhinorrhea, trouble swallowing, neck pain and neck stiffness.   Respiratory: Negative for cough and  shortness of breath.   Cardiovascular: Negative for chest pain and palpitations.  Gastrointestinal: Negative for nausea, vomiting and abdominal pain.  Genitourinary: Negative for dysuria, urgency, frequency and flank pain.  Neurological: Negative for dizziness, weakness, light-headedness, numbness and headaches.  All other systems reviewed and are negative.    Allergies  Bactrim; Flagyl; Onion; and Peanuts  Home Medications   Current Outpatient Rx  Name Route Sig Dispense Refill  . ALPRAZOLAM 1 MG PO TABS Oral Take 1 mg by mouth every 6 (six) hours as needed. For anxiety    . CITALOPRAM HYDROBROMIDE 20 MG PO TABS Oral Take 20 mg by mouth daily.    Marland Kitchen FAMOTIDINE 20 MG PO TABS Oral Take 20 mg by mouth 2 (two) times daily.      Marland Kitchen ALPRAZOLAM 2 MG PO TABS Oral Take 2 mg by mouth 4 (four) times daily as needed. For anxiety     . ARIPIPRAZOLE 5 MG PO TABS Oral Take 5 mg by mouth daily.      Marland Kitchen CLINDAMYCIN HCL 300 MG PO CAPS  1 tablet PO BID for 7 days 14 capsule 0  . DIPHENHYDRAMINE HCL 25 MG PO CAPS Oral Take 25 mg by mouth daily as needed. For allergies     . ESCITALOPRAM OXALATE 20 MG PO TABS Oral Take 20 mg by mouth daily.      Marland Kitchen HYDROCODONE-ACETAMINOPHEN 5-325 MG PO TABS  1 or 2 po  q4h prn pain 15 tablet 0  . HYDROCODONE-ACETAMINOPHEN 5-325 MG PO TABS  1-2 tablets PO Q 4-6 hours prn pain 15 tablet 0  . HYDROCODONE-ACETAMINOPHEN 5-325 MG PO TABS  One tab po q 4-6 hrs prn pain 20 tablet 0  . METHOCARBAMOL 500 MG PO TABS  2 po tid for spasm 30 tablet 0  . PENICILLIN V POTASSIUM 500 MG PO TABS Oral Take 1 tablet (500 mg total) by mouth 4 (four) times daily. 40 tablet 0  . TRAMADOL HCL 50 MG PO TABS Oral Take 1 tablet (50 mg total) by mouth every 6 (six) hours as needed for pain. 15 tablet 0  . ZOLPIDEM TARTRATE 10 MG PO TABS Oral Take 10 mg by mouth at bedtime.       BP 113/59  Pulse 57  Temp(Src) 98.4 F (36.9 C) (Oral)  Resp 16  Ht 5' (1.524 m)  Wt 167 lb (75.751 kg)  BMI 32.62  kg/m2  SpO2 100%  LMP 06/18/2011  Physical Exam  Nursing note and vitals reviewed. Constitutional: She is oriented to person, place, and time. She appears well-developed and well-nourished. No distress.  HENT:  Head: Normocephalic and atraumatic.  Right Ear: External ear normal.  Left Ear: External ear normal.  Mouth/Throat: Oropharynx is clear and moist. Abnormal dentition. Dental caries present. No dental abscesses.    Eyes: Conjunctivae and EOM are normal. Pupils are equal, round, and reactive to light.  Neck: Normal range of motion. Neck supple.  Cardiovascular: Normal rate, regular rhythm, normal heart sounds and intact distal pulses.  Exam reveals no gallop and no friction rub.   No murmur heard. Pulmonary/Chest: Effort normal and breath sounds normal. No respiratory distress.  Abdominal: Soft. Bowel sounds are normal. There is no tenderness.  Musculoskeletal: Normal range of motion. She exhibits no tenderness.  Neurological: She is alert and oriented to person, place, and time. No cranial nerve deficit.  Skin: Skin is warm and dry. No rash noted.    ED Course  Procedures (including critical care time)  Labs Reviewed - No data to display No results found.   1. Dentalgia   2. Dental caries       MDM  Dental pain secondary to dental caries. There is no evidence of abscess. There is no evidence of Ludwig's angina. She'll be discharged home with penicillin and pain medication. Should you to followup with the dentist. I explained that the emergency department is not appropriate place to treat dental pain from decayed teeth        Dayton Bailiff, MD 07/31/11 [phone]

## 2011-07-31 NOTE — ED Notes (Signed)
Toothache that started a couple of weeks ago, at the point I can't stand it anymore per pt.

## 2012-01-11 ENCOUNTER — Emergency Department (HOSPITAL_COMMUNITY)
Admission: EM | Admit: 2012-01-11 | Discharge: 2012-01-11 | Disposition: A | Discharge status: Home / Self Care | Payer: Self-pay | Attending: Emergency Medicine | Admitting: Emergency Medicine

## 2012-01-11 ENCOUNTER — Emergency Department (HOSPITAL_COMMUNITY): Payer: Self-pay

## 2012-01-11 ENCOUNTER — Encounter (HOSPITAL_COMMUNITY): Payer: Self-pay | Admitting: *Deleted

## 2012-01-11 DIAGNOSIS — S39012A Strain of muscle, fascia and tendon of lower back, initial encounter: Secondary | ICD-10-CM

## 2012-01-11 DIAGNOSIS — R55 Syncope and collapse: Secondary | ICD-10-CM | POA: Insufficient documentation

## 2012-01-11 DIAGNOSIS — Y999 Unspecified external cause status: Secondary | ICD-10-CM | POA: Insufficient documentation

## 2012-01-11 DIAGNOSIS — T07XXXA Unspecified multiple injuries, initial encounter: Secondary | ICD-10-CM | POA: Insufficient documentation

## 2012-01-11 DIAGNOSIS — S335XXA Sprain of ligaments of lumbar spine, initial encounter: Secondary | ICD-10-CM | POA: Insufficient documentation

## 2012-01-11 DIAGNOSIS — S0181XA Laceration without foreign body of other part of head, initial encounter: Secondary | ICD-10-CM

## 2012-01-11 DIAGNOSIS — Z79899 Other long term (current) drug therapy: Secondary | ICD-10-CM | POA: Insufficient documentation

## 2012-01-11 DIAGNOSIS — S0180XA Unspecified open wound of other part of head, initial encounter: Secondary | ICD-10-CM | POA: Insufficient documentation

## 2012-01-11 DIAGNOSIS — M549 Dorsalgia, unspecified: Secondary | ICD-10-CM | POA: Insufficient documentation

## 2012-01-11 DIAGNOSIS — R51 Headache: Secondary | ICD-10-CM | POA: Insufficient documentation

## 2012-01-11 DIAGNOSIS — M542 Cervicalgia: Secondary | ICD-10-CM | POA: Insufficient documentation

## 2012-01-11 DIAGNOSIS — F17200 Nicotine dependence, unspecified, uncomplicated: Secondary | ICD-10-CM | POA: Insufficient documentation

## 2012-01-11 MED ORDER — TETANUS-DIPHTH-ACELL PERTUSSIS 5-2.5-18.5 LF-MCG/0.5 IM SUSP
0.5000 mL | Freq: Once | INTRAMUSCULAR | Status: AC
  Administered 2012-01-11: 0.5 mL via INTRAMUSCULAR
  Filled 2012-01-11: qty 0.5

## 2012-01-11 MED ORDER — HYDROCODONE-ACETAMINOPHEN 5-325 MG PO TABS
1.0000 | ORAL_TABLET | Freq: Once | ORAL | Status: AC
  Administered 2012-01-11: 1 via ORAL
  Filled 2012-01-11: qty 1

## 2012-01-11 MED ORDER — CEPHALEXIN 500 MG PO CAPS
500.0000 mg | ORAL_CAPSULE | Freq: Once | ORAL | Status: AC
  Administered 2012-01-11: 500 mg via ORAL
  Filled 2012-01-11: qty 1

## 2012-01-11 MED ORDER — HYDROCODONE-ACETAMINOPHEN 5-325 MG PO TABS: 1.0000 | ORAL_TABLET | Freq: Four times a day (QID) | ORAL | Status: DC | PRN

## 2012-01-11 MED ORDER — KETOROLAC TROMETHAMINE 60 MG/2ML IM SOLN
60.0000 mg | Freq: Once | INTRAMUSCULAR | Status: AC
  Administered 2012-01-11: 60 mg via INTRAMUSCULAR
  Filled 2012-01-11: qty 2

## 2012-01-11 MED ORDER — CEPHALEXIN 500 MG PO CAPS: 500.0000 mg | ORAL_CAPSULE | Freq: Four times a day (QID) | ORAL | Status: DC

## 2012-01-11 MED ORDER — DIAZEPAM 5 MG/ML IJ SOLN
5.0000 mg | Freq: Once | INTRAMUSCULAR | Status: AC
  Administered 2012-01-11: 5 mg via INTRAVENOUS
  Filled 2012-01-11: qty 2

## 2012-01-11 MED ORDER — IBUPROFEN 800 MG PO TABS
800.0000 mg | ORAL_TABLET | Freq: Once | ORAL | Status: AC
  Administered 2012-01-11: 800 mg via ORAL
  Filled 2012-01-11: qty 1
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
## 2012-01-11 NOTE — ED Notes (Signed)
Patient returned from radiology
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
## 2012-01-11 NOTE — ED Provider Notes (Signed)
Medical screening examination/treatment/procedure(s) were performed by non-physician practitioner and as supervising physician I was immediately available for consultation/collaboration.   Benny Lennert, MD 01/11/12 [phone]
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
## 2012-01-15 ENCOUNTER — Encounter (HOSPITAL_COMMUNITY): Payer: Self-pay | Admitting: Emergency Medicine

## 2012-01-15 ENCOUNTER — Emergency Department (HOSPITAL_COMMUNITY): Payer: Medicaid Other

## 2012-01-15 ENCOUNTER — Emergency Department (HOSPITAL_COMMUNITY)
Admission: EM | Admit: 2012-01-15 | Discharge: 2012-01-16 | Disposition: A | Discharge status: Home / Self Care | Payer: Medicaid Other | Attending: Emergency Medicine | Admitting: Emergency Medicine

## 2012-01-15 DIAGNOSIS — F4310 Post-traumatic stress disorder, unspecified: Secondary | ICD-10-CM | POA: Insufficient documentation

## 2012-01-15 DIAGNOSIS — N949 Unspecified condition associated with female genital organs and menstrual cycle: Secondary | ICD-10-CM | POA: Insufficient documentation

## 2012-01-15 DIAGNOSIS — F411 Generalized anxiety disorder: Secondary | ICD-10-CM | POA: Insufficient documentation

## 2012-01-15 DIAGNOSIS — H539 Unspecified visual disturbance: Secondary | ICD-10-CM | POA: Insufficient documentation

## 2012-01-15 DIAGNOSIS — R070 Pain in throat: Secondary | ICD-10-CM | POA: Insufficient documentation

## 2012-01-15 DIAGNOSIS — R51 Headache: Secondary | ICD-10-CM | POA: Insufficient documentation

## 2012-01-15 DIAGNOSIS — M542 Cervicalgia: Secondary | ICD-10-CM | POA: Insufficient documentation

## 2012-01-15 DIAGNOSIS — M549 Dorsalgia, unspecified: Secondary | ICD-10-CM | POA: Insufficient documentation

## 2012-01-15 DIAGNOSIS — R509 Fever, unspecified: Secondary | ICD-10-CM | POA: Insufficient documentation

## 2012-01-15 DIAGNOSIS — Z79899 Other long term (current) drug therapy: Secondary | ICD-10-CM | POA: Insufficient documentation

## 2012-01-15 DIAGNOSIS — F319 Bipolar disorder, unspecified: Secondary | ICD-10-CM | POA: Insufficient documentation

## 2012-01-15 DIAGNOSIS — F17200 Nicotine dependence, unspecified, uncomplicated: Secondary | ICD-10-CM | POA: Insufficient documentation

## 2012-01-15 DIAGNOSIS — J45909 Unspecified asthma, uncomplicated: Secondary | ICD-10-CM | POA: Insufficient documentation

## 2012-01-15 DIAGNOSIS — R300 Dysuria: Secondary | ICD-10-CM | POA: Insufficient documentation

## 2012-01-15 DIAGNOSIS — N739 Female pelvic inflammatory disease, unspecified: Secondary | ICD-10-CM

## 2012-01-15 DIAGNOSIS — R079 Chest pain, unspecified: Secondary | ICD-10-CM | POA: Insufficient documentation

## 2012-01-15 LAB — CBC WITH DIFFERENTIAL/PLATELET
Basophils Absolute: 0 10*3/uL (ref 0.0–0.1)
Basophils Relative: 0 % (ref 0–1)
Lymphocytes Relative: 23 % (ref 12–46)
MCHC: 33.3 g/dL (ref 30.0–36.0)
Neutro Abs: 4.6 10*3/uL (ref 1.7–7.7)
Platelets: 297 10*3/uL (ref 150–400)
RDW: 13.3 % (ref 11.5–15.5)
WBC: 6.9 10*3/uL (ref 4.0–10.5)

## 2012-01-15 LAB — BASIC METABOLIC PANEL
CO2: 25 mEq/L (ref 19–32)
Calcium: 9.5 mg/dL (ref 8.4–10.5)
Chloride: 104 mEq/L (ref 96–112)
Creatinine, Ser: 0.89 mg/dL (ref 0.50–1.10)
GFR calc Af Amer: >90 mL/min (ref >90)
Sodium: 139 mEq/L (ref 135–145)

## 2012-01-15 LAB — URINALYSIS, ROUTINE W REFLEX MICROSCOPIC
Ketones, ur: NEGATIVE mg/dL
Leukocytes, UA: NEGATIVE
Nitrite: NEGATIVE
Protein, ur: NEGATIVE mg/dL
Urobilinogen, UA: 0.2 mg/dL (ref 0.0–1.0)

## 2012-01-15 LAB — WET PREP, GENITAL
Trich, Wet Prep: NONE SEEN
Yeast Wet Prep HPF POC: NONE SEEN

## 2012-01-15 MED ORDER — DOXYCYCLINE HYCLATE 100 MG PO TABS
100.0000 mg | ORAL_TABLET | Freq: Once | ORAL | Status: AC
  Administered 2012-01-15: 100 mg via ORAL
  Filled 2012-01-15: qty 1

## 2012-01-15 MED ORDER — IOHEXOL 300 MG/ML  SOLN
100.0000 mL | Freq: Once | INTRAMUSCULAR | Status: AC | PRN
  Administered 2012-01-15: 100 mL via INTRAVENOUS

## 2012-01-15 MED ORDER — HYDROMORPHONE HCL PF 1 MG/ML IJ SOLN
1.0000 mg | Freq: Once | INTRAMUSCULAR | Status: AC
  Administered 2012-01-15: 1 mg via INTRAVENOUS
  Filled 2012-01-15: qty 1

## 2012-01-15 MED ORDER — DEXTROSE 5 % IV SOLN
1.0000 g | Freq: Once | INTRAVENOUS | Status: AC
  Administered 2012-01-15: 1 g via INTRAVENOUS
  Filled 2012-01-15: qty 10

## 2012-01-15 MED ORDER — IOHEXOL 300 MG/ML  SOLN: 40.0000 mL | Freq: Once | INTRAMUSCULAR | Status: AC | PRN

## 2012-01-15 MED ORDER — SODIUM CHLORIDE 0.9 % IV SOLN
INTRAVENOUS | Status: DC
  Administered 2012-01-15: 22:00:00 via INTRAVENOUS

## 2012-01-15 MED ORDER — SODIUM CHLORIDE 0.9 % IV BOLUS (SEPSIS)
250.0000 mL | Freq: Once | INTRAVENOUS | Status: AC
  Administered 2012-01-15: 250 mL via INTRAVENOUS

## 2012-01-15 MED ORDER — ONDANSETRON HCL 4 MG/2ML IJ SOLN
4.0000 mg | Freq: Once | INTRAMUSCULAR | Status: AC
  Administered 2012-01-15: 4 mg via INTRAVENOUS
  Filled 2012-01-15: qty 2

## 2012-01-15 NOTE — ED Notes (Signed)
Patient c/o pelvic pain with associated lower back pain x 3 days.  Patient states she has had vaginal discharge.
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
## 2012-01-15 NOTE — ED Notes (Signed)
Pt reports that she continues to have pain and is requesting second dose of dilaudid. Explained to pt that she's had the first dose within last 20 minutes and it has not had time to fully take effect.  No nausea reported.  Pt drinking CT prep.

## 2012-01-16 MED ORDER — HYDROCODONE-ACETAMINOPHEN 5-325 MG PO TABS: 1.0000 | ORAL_TABLET | Freq: Four times a day (QID) | ORAL | Status: AC | PRN

## 2012-01-16 MED ORDER — DOXYCYCLINE HYCLATE 100 MG PO CAPS: 100.0000 mg | ORAL_CAPSULE | Freq: Two times a day (BID) | ORAL | Status: AC

## 2012-01-16 NOTE — ED Notes (Signed)
Pt alert & oriented x4, stable gait. Pt given discharge instructions, paperwork & prescription(s). Patient instructed to stop at the registration desk to finish any additional paperwork. pt verbalized understanding. Pt left department w/ no further questions.  

## 2012-01-19 DIAGNOSIS — F319 Bipolar disorder, unspecified: Secondary | ICD-10-CM | POA: Insufficient documentation

## 2012-01-19 DIAGNOSIS — F4310 Post-traumatic stress disorder, unspecified: Secondary | ICD-10-CM | POA: Insufficient documentation

## 2012-01-19 DIAGNOSIS — T07XXXA Unspecified multiple injuries, initial encounter: Secondary | ICD-10-CM | POA: Insufficient documentation

## 2012-01-19 DIAGNOSIS — IMO0002 Reserved for concepts with insufficient information to code with codable children: Secondary | ICD-10-CM | POA: Insufficient documentation

## 2012-01-19 DIAGNOSIS — M542 Cervicalgia: Secondary | ICD-10-CM | POA: Insufficient documentation

## 2012-01-19 DIAGNOSIS — F411 Generalized anxiety disorder: Secondary | ICD-10-CM | POA: Insufficient documentation

## 2012-01-19 DIAGNOSIS — F17200 Nicotine dependence, unspecified, uncomplicated: Secondary | ICD-10-CM | POA: Insufficient documentation

## 2012-01-19 DIAGNOSIS — J45909 Unspecified asthma, uncomplicated: Secondary | ICD-10-CM | POA: Insufficient documentation

## 2012-01-19 LAB — GC/CHLAMYDIA PROBE AMP, GENITAL: GC Probe Amp, Genital: NEGATIVE

## 2012-01-20 ENCOUNTER — Emergency Department (HOSPITAL_COMMUNITY)
Admission: EM | Admit: 2012-01-20 | Discharge: 2012-01-20 | Disposition: A | Discharge status: Home / Self Care | Payer: Medicaid Other | Attending: Emergency Medicine | Admitting: Emergency Medicine

## 2012-01-20 ENCOUNTER — Emergency Department (HOSPITAL_COMMUNITY): Payer: Medicaid Other

## 2012-01-20 ENCOUNTER — Encounter (HOSPITAL_COMMUNITY): Payer: Self-pay

## 2012-01-20 DIAGNOSIS — T07XXXA Unspecified multiple injuries, initial encounter: Secondary | ICD-10-CM

## 2012-01-20 MED ORDER — TETANUS-DIPHTH-ACELL PERTUSSIS 5-2.5-18.5 LF-MCG/0.5 IM SUSP
0.5000 mL | Freq: Once | INTRAMUSCULAR | Status: DC
  Filled 2012-01-20: qty 0.5

## 2012-01-20 MED ORDER — CYCLOBENZAPRINE HCL 10 MG PO TABS
10.0000 mg | ORAL_TABLET | Freq: Once | ORAL | Status: AC
  Administered 2012-01-20: 10 mg via ORAL
  Filled 2012-01-20: qty 1

## 2012-01-20 MED ORDER — CYCLOBENZAPRINE HCL 10 MG PO TABS: 10.0000 mg | ORAL_TABLET | Freq: Two times a day (BID) | ORAL | Status: AC | PRN

## 2012-01-20 MED ORDER — IBUPROFEN 800 MG PO TABS: 800.0000 mg | ORAL_TABLET | Freq: Three times a day (TID) | ORAL | Status: AC

## 2012-01-20 MED ORDER — IBUPROFEN 800 MG PO TABS
800.0000 mg | ORAL_TABLET | Freq: Once | ORAL | Status: AC
  Administered 2012-01-20: 800 mg via ORAL
  Filled 2012-01-20: qty 1
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
## 2012-01-20 NOTE — ED Notes (Signed)
Discharge instructions reviewed with pt, questions answered. Pt verbalized understanding.  

## 2012-01-20 NOTE — ED Notes (Signed)
TDAP not given, pt had one in June of this year.

## 2012-02-18 ENCOUNTER — Encounter (HOSPITAL_COMMUNITY): Payer: Self-pay | Admitting: *Deleted

## 2012-02-18 ENCOUNTER — Emergency Department (HOSPITAL_COMMUNITY)
Admission: EM | Admit: 2012-02-18 | Discharge: 2012-02-19 | Disposition: A | Discharge status: Home / Self Care | Payer: Medicaid Other | Attending: Emergency Medicine | Admitting: Emergency Medicine

## 2012-02-18 DIAGNOSIS — F17200 Nicotine dependence, unspecified, uncomplicated: Secondary | ICD-10-CM | POA: Insufficient documentation

## 2012-02-18 DIAGNOSIS — F4310 Post-traumatic stress disorder, unspecified: Secondary | ICD-10-CM | POA: Insufficient documentation

## 2012-02-18 DIAGNOSIS — F319 Bipolar disorder, unspecified: Secondary | ICD-10-CM | POA: Insufficient documentation

## 2012-02-18 DIAGNOSIS — N739 Female pelvic inflammatory disease, unspecified: Secondary | ICD-10-CM | POA: Insufficient documentation

## 2012-02-18 DIAGNOSIS — N730 Acute parametritis and pelvic cellulitis: Secondary | ICD-10-CM

## 2012-02-18 HISTORY — DX: Disorder of kidney and ureter, unspecified: N28.9

## 2012-02-18 LAB — URINALYSIS, ROUTINE W REFLEX MICROSCOPIC
Glucose, UA: NEGATIVE mg/dL
Hgb urine dipstick: NEGATIVE
Leukocytes, UA: NEGATIVE
Specific Gravity, Urine: 1.025 (ref 1.005–1.030)
pH: 6.5 (ref 5.0–8.0)

## 2012-02-18 LAB — BASIC METABOLIC PANEL
BUN: 12 mg/dL (ref 6–23)
CO2: 25 mEq/L (ref 19–32)
Chloride: 107 mEq/L (ref 96–112)
GFR calc Af Amer: >90 mL/min (ref >90)
Potassium: 4 mEq/L (ref 3.5–5.1)

## 2012-02-18 LAB — CBC WITH DIFFERENTIAL/PLATELET
Basophils Relative: 0 % (ref 0–1)
HCT: 35.3 % — ABNORMAL LOW (ref 36.0–46.0)
Hemoglobin: 12 g/dL (ref 12.0–15.0)
Lymphocytes Relative: 43 % (ref 12–46)
Lymphs Abs: 3 10*3/uL (ref 0.7–4.0)
MCHC: 34 g/dL (ref 30.0–36.0)
Monocytes Absolute: 0.5 10*3/uL (ref 0.1–1.0)
Monocytes Relative: 6 % (ref 3–12)
Neutro Abs: 3.4 10*3/uL (ref 1.7–7.7)
Neutrophils Relative %: 49 % (ref 43–77)
RBC: 3.63 MIL/uL — ABNORMAL LOW (ref 3.87–5.11)
WBC: 7 10*3/uL (ref 4.0–10.5)

## 2012-02-18 LAB — PREGNANCY, URINE: Preg Test, Ur: NEGATIVE

## 2012-02-18 MED ORDER — SODIUM CHLORIDE 0.9 % IV SOLN
INTRAVENOUS | Status: DC
  Administered 2012-02-18: 100 mL/h via INTRAVENOUS

## 2012-02-18 MED ORDER — SODIUM CHLORIDE 0.9 % IV BOLUS (SEPSIS)
250.0000 mL | Freq: Once | INTRAVENOUS | Status: AC
  Administered 2012-02-18: 250 mL via INTRAVENOUS

## 2012-02-18 MED ORDER — HYDROMORPHONE HCL PF 1 MG/ML IJ SOLN
1.0000 mg | Freq: Once | INTRAMUSCULAR | Status: AC
  Administered 2012-02-18: 1 mg via INTRAVENOUS
  Filled 2012-02-18: qty 1

## 2012-02-18 MED ORDER — ONDANSETRON HCL 4 MG/2ML IJ SOLN
4.0000 mg | Freq: Once | INTRAMUSCULAR | Status: AC
  Administered 2012-02-18: 4 mg via INTRAVENOUS
  Filled 2012-02-18: qty 2

## 2012-02-18 NOTE — ED Notes (Signed)
Lower abdominal pain and lower back pain x 3-4 days. Denies N/V/ D. States she has only urinated twice today. Stats "nasty, funky" d/c. White in color. D/c x 1 week.

## 2012-02-18 NOTE — ED Provider Notes (Signed)
History    This chart was scribed for Shelda Jakes, MD, MD by Smitty Pluck. The patient was seen in room APA08 and the patient's care was started at 9:06PM.   CSN: [id number]  Arrival date & time 02/18/12  7829   First MD Initiated Contact with Patient 02/18/12 2047      Chief Complaint  Patient presents with  . Abdominal Pain    (Consider location/radiation/quality/duration/timing/severity/associated sxs/prior treatment) Patient is a 31 y.o. female presenting with abdominal pain. The history is provided by the patient.  Abdominal Pain The primary symptoms of the illness include abdominal pain, fever, shortness of breath, nausea, vomiting and vaginal discharge. The primary symptoms of the illness do not include dysuria. The current episode started more than 2 days ago. The onset of the illness was sudden. The problem has not changed since onset. The abdominal pain began more than 2 days ago. The pain came on suddenly. The abdominal pain has been unchanged since its onset. The abdominal pain is located in the LLQ and RLQ. The abdominal pain does not radiate.  Nausea began 3 to 5 days ago.  The vomiting began more than 2 days ago.  The vaginal discharge was first noticed more than 2 days ago. Vaginal discharge is a new problem. The patient believes that the vaginal discharge is unchanged since it began. The color of the discharge is white. The vaginal discharge is not associated with dysuria.    Katherine Burgess is a 31 y.o. female who presents to the Emergency Department complaining of constant, moderate, bilateral, lower quadrant abdominal pain and lower back pain onset 4 days ago. Pt reports having nausea, vomiting and fever. Denies diarrhea. Pt reports similar pain when she had kidney stones. LMP was 7/22-7/26. Pt reports vaginal discharge onset 4 days ago. She reports the discharge is white. Denies vaginal itching. She reports having decreased urination. Denies dysuria. Reports mild  SOB and rash on left neck. Denies radiation.   Past Medical History  Diagnosis Date  . Depression   . Asthma   . Anxiety   . Bipolar 1 disorder   . PTSD (post-traumatic stress disorder)   . Renal disorder     Past Surgical History  Procedure Date  . Kidney stone surgery     Family History  Problem Relation Age of Onset  . Thyroid disease Father   . Heart failure Father   . Diabetes Father   . Hypertension Father     History  Substance Use Topics  . Smoking status: Current Everyday Smoker -- 2.0 packs/day    Types: Cigarettes  . Smokeless tobacco: Not on file  . Alcohol Use: Yes     beer occasionally     OB History    Grav Para Term Preterm Abortions TAB SAB Ect Mult Living                  Review of Systems  Constitutional: Positive for fever.  Respiratory: Positive for shortness of breath.   Cardiovascular: Negative for chest pain.  Gastrointestinal: Positive for nausea, vomiting and abdominal pain.  Genitourinary: Positive for vaginal discharge. Negative for dysuria.  Skin: Positive for rash.  Neurological: Negative for headaches.    Allergies  Bactrim; Flagyl; Onion; and Peanuts  Home Medications   Current Outpatient Rx  Name Route Sig Dispense Refill  . ALPRAZOLAM 1 MG PO TABS Oral Take 1 mg by mouth 3 (three) times daily.    Marland Kitchen CITALOPRAM HYDROBROMIDE 40 MG  PO TABS Oral Take 40 mg by mouth daily.    Marland Kitchen LITHIUM CARBONATE 300 MG PO CAPS Oral Take 300 mg by mouth 3 (three) times daily with meals.    . TRAZODONE HCL 100 MG PO TABS Oral Take 200 mg by mouth at bedtime.    Marland Kitchen DOXYCYCLINE HYCLATE 100 MG PO CAPS Oral Take 1 capsule (100 mg total) by mouth 2 (two) times daily. 28 capsule 0  . HYDROCODONE-ACETAMINOPHEN 5-325 MG PO TABS Oral Take 1-2 tablets by mouth every 6 (six) hours as needed for pain. 14 tablet 0    BP 111/59  Pulse 54  Temp 98.6 F (37 C) (Oral)  Resp 20  Ht 5' (1.524 m)  Wt 165 lb (74.844 kg)  BMI 32.22 kg/m2  SpO2 96%  LMP  02/02/2012  Physical Exam  Nursing note and vitals reviewed. Constitutional: She is oriented to person, place, and time. She appears well-developed and well-nourished.  HENT:  Head: Normocephalic and atraumatic.  Cardiovascular: Normal rate, regular rhythm and normal heart sounds.   Pulmonary/Chest: Effort normal and breath sounds normal.  Abdominal: Bowel sounds are normal. She exhibits no distension. There is tenderness (lower quadrants ).  Genitourinary: Vaginal discharge found.       External genitalia is normal. Vaginal vault with a purulent discharge. Mark did cervical motion tenderness uterine tenderness and adnexal tenderness.  Musculoskeletal: Normal range of motion. She exhibits no edema.  Neurological: She is alert and oriented to person, place, and time.  Skin: Skin is warm and dry.  Psychiatric: She has a normal mood and affect. Her behavior is normal.    ED Course  Procedures (including critical care time) DIAGNOSTIC STUDIES: Oxygen Saturation is 99% on room air, normal by my interpretation.    COORDINATION OF CARE: 9:10PM EDP discusses pt ED treatment with pt  9:45PM EDP ordered medication:  Scheduled Meds:    . HYDROmorphone  1 mg Intravenous Once  . HYDROmorphone  1 mg Intravenous Once  . ondansetron  4 mg Intravenous Once  . sodium chloride  250 mL Intravenous Once   Continuous Infusions:    . sodium chloride 100 mL/hr (02/18/12 2148)  . cefTRIAXone (ROCEPHIN) IVPB 1 gram/50 mL D5W     PRN Meds:.    Labs Reviewed  URINALYSIS, ROUTINE W REFLEX MICROSCOPIC - Abnormal; Notable for the following:    Color, Urine AMBER (*)  BIOCHEMICALS MAY BE AFFECTED BY COLOR   APPearance CLOUDY (*)     Ketones, ur TRACE (*)     All other components within normal limits  CBC WITH DIFFERENTIAL - Abnormal; Notable for the following:    RBC 3.63 (*)     HCT 35.3 (*)     All other components within normal limits  BASIC METABOLIC PANEL - Abnormal; Notable for the  following:    GFR calc non Af Amer 90 (*)     All other components within normal limits  PREGNANCY, URINE  GC/CHLAMYDIA PROBE AMP, GENITAL  WET PREP, GENITAL   No results found.  Results for orders placed during the hospital encounter of 02/18/12  URINALYSIS, ROUTINE W REFLEX MICROSCOPIC      Component Value Range   Color, Urine AMBER (*) YELLOW   APPearance CLOUDY (*) CLEAR   Specific Gravity, Urine 1.025  1.005 - 1.030   pH 6.5  5.0 - 8.0   Glucose, UA NEGATIVE  NEGATIVE mg/dL   Hgb urine dipstick NEGATIVE  NEGATIVE   Bilirubin Urine NEGATIVE  NEGATIVE   Ketones, ur TRACE (*) NEGATIVE mg/dL   Protein, ur NEGATIVE  NEGATIVE mg/dL   Urobilinogen, UA 0.2  0.0 - 1.0 mg/dL   Nitrite NEGATIVE  NEGATIVE   Leukocytes, UA NEGATIVE  NEGATIVE  PREGNANCY, URINE      Component Value Range   Preg Test, Ur NEGATIVE  NEGATIVE  CBC WITH DIFFERENTIAL      Component Value Range   WBC 7.0  4.0 - 10.5 K/uL   RBC 3.63 (*) 3.87 - 5.11 MIL/uL   Hemoglobin 12.0  12.0 - 15.0 g/dL   HCT 16.1 (*) 09.6 - 04.5 %   MCV 97.2  78.0 - 100.0 fL   MCH 33.1  26.0 - 34.0 pg   MCHC 34.0  30.0 - 36.0 g/dL   RDW 40.9  81.1 - 91.4 %   Platelets 282  150 - 400 K/uL   Neutrophils Relative 49  43 - 77 %   Neutro Abs 3.4  1.7 - 7.7 K/uL   Lymphocytes Relative 43  12 - 46 %   Lymphs Abs 3.0  0.7 - 4.0 K/uL   Monocytes Relative 6  3 - 12 %   Monocytes Absolute 0.5  0.1 - 1.0 K/uL   Eosinophils Relative 2  0 - 5 %   Eosinophils Absolute 0.1  0.0 - 0.7 K/uL   Basophils Relative 0  0 - 1 %   Basophils Absolute 0.0  0.0 - 0.1 K/uL  BASIC METABOLIC PANEL      Component Value Range   Sodium 139  135 - 145 mEq/L   Potassium 4.0  3.5 - 5.1 mEq/L   Chloride 107  96 - 112 mEq/L   CO2 25  19 - 32 mEq/L   Glucose, Bld 86  70 - 99 mg/dL   BUN 12  6 - 23 mg/dL   Creatinine, Ser 7.82  0.50 - 1.10 mg/dL   Calcium 8.8  8.4 - 95.6 mg/dL   GFR calc non Af Amer 90 (*) >90 mL/min   GFR calc Af Amer >90  >90 mL/min    1.  PID (acute pelvic inflammatory disease)       MDM  Pelvic examination consistent clinically with pelvic inflammatory disease. Patient treated with IV Rocephin in the emergency department will be sent home with 14 day supply of doxycycline. Will not put patient on Flagyl 2 to the cost of the doxycycline patients can have trouble affording that. Also prescribed hydrocodone for pain. Referral to OB/GYN provided if needed. Patient will return if not improved in 2-4 days.    I personally performed the services described in this documentation, which was scribed in my presence. The recorded information has been reviewed and considered.        Shelda Jakes, MD 02/19/12 [phone]

## 2012-02-19 LAB — WET PREP, GENITAL
Trich, Wet Prep: NONE SEEN
Yeast Wet Prep HPF POC: NONE SEEN

## 2012-02-19 MED ORDER — AZITHROMYCIN 250 MG PO TABS: 250.0000 mg | ORAL_TABLET | Freq: Every day | ORAL | Status: AC

## 2012-02-19 MED ORDER — DOXYCYCLINE HYCLATE 100 MG PO CAPS: 100.0000 mg | ORAL_CAPSULE | Freq: Two times a day (BID) | ORAL | Status: AC

## 2012-02-19 MED ORDER — HYDROCODONE-ACETAMINOPHEN 5-325 MG PO TABS: 1.0000 | ORAL_TABLET | Freq: Four times a day (QID) | ORAL | Status: AC | PRN

## 2012-02-19 MED ORDER — DEXTROSE 5 % IV SOLN
1.0000 g | Freq: Once | INTRAVENOUS | Status: AC
  Administered 2012-02-19: 1 g via INTRAVENOUS
  Filled 2012-02-19: qty 10

## 2012-02-19 MED ORDER — HYDROMORPHONE HCL PF 1 MG/ML IJ SOLN
1.0000 mg | Freq: Once | INTRAMUSCULAR | Status: AC
  Administered 2012-02-19: 1 mg via INTRAVENOUS
  Filled 2012-02-19: qty 1

## 2012-03-17 ENCOUNTER — Emergency Department (HOSPITAL_COMMUNITY): Payer: Medicaid Other

## 2012-03-17 ENCOUNTER — Encounter (HOSPITAL_COMMUNITY): Payer: Self-pay

## 2012-03-17 ENCOUNTER — Emergency Department (HOSPITAL_COMMUNITY)
Admission: EM | Admit: 2012-03-17 | Discharge: 2012-03-17 | Disposition: A | Discharge status: Home / Self Care | Payer: Medicaid Other | Attending: Emergency Medicine | Admitting: Emergency Medicine

## 2012-03-17 DIAGNOSIS — F319 Bipolar disorder, unspecified: Secondary | ICD-10-CM | POA: Insufficient documentation

## 2012-03-17 DIAGNOSIS — F341 Dysthymic disorder: Secondary | ICD-10-CM | POA: Insufficient documentation

## 2012-03-17 DIAGNOSIS — J45901 Unspecified asthma with (acute) exacerbation: Secondary | ICD-10-CM | POA: Insufficient documentation

## 2012-03-17 DIAGNOSIS — F17200 Nicotine dependence, unspecified, uncomplicated: Secondary | ICD-10-CM | POA: Insufficient documentation

## 2012-03-17 DIAGNOSIS — Z720 Tobacco use: Secondary | ICD-10-CM

## 2012-03-17 LAB — POCT PREGNANCY, URINE: Preg Test, Ur: NEGATIVE

## 2012-03-17 MED ORDER — PREDNISONE 20 MG PO TABS
60.0000 mg | ORAL_TABLET | Freq: Once | ORAL | Status: AC
  Administered 2012-03-17: 60 mg via ORAL
  Filled 2012-03-17: qty 3

## 2012-03-17 MED ORDER — IPRATROPIUM BROMIDE 0.02 % IN SOLN
1.0000 mg | Freq: Once | RESPIRATORY_TRACT | Status: AC
  Administered 2012-03-17: 1 mg via RESPIRATORY_TRACT
  Filled 2012-03-17: qty 2.5

## 2012-03-17 MED ORDER — ALBUTEROL (5 MG/ML) CONTINUOUS INHALATION SOLN
10.0000 mg/h | INHALATION_SOLUTION | RESPIRATORY_TRACT | Status: AC
  Administered 2012-03-17: 10 mg/h via RESPIRATORY_TRACT
  Filled 2012-03-17: qty 20

## 2012-03-17 MED ORDER — PREDNISONE 20 MG PO TABS: 40.0000 mg | ORAL_TABLET | Freq: Every day | ORAL | Status: AC

## 2012-03-17 MED ORDER — ALBUTEROL SULFATE HFA 108 (90 BASE) MCG/ACT IN AERS
2.0000 | INHALATION_SPRAY | Freq: Once | RESPIRATORY_TRACT | Status: DC
  Filled 2012-03-17: qty 6.7

## 2012-03-17 NOTE — ED Notes (Signed)
Pt reports asthma "acting up" for last 2 months, has been living in a home w/ mold and thinks may be causing her problems, having difficulty breathing and cough

## 2012-03-17 NOTE — ED Provider Notes (Signed)
History    This chart was scribed for Katherine Chad, MD, MD by Smitty Pluck. The patient was seen in room APA18 and the patient's care was started at 6:53PM.   CSN: [id number]  Arrival date & time 03/17/12  5621   First MD Initiated Contact with Patient 03/17/12 1853      Chief Complaint  Patient presents with  . Asthma    (Consider location/radiation/quality/duration/timing/severity/associated sxs/prior treatment) Patient is a 31 y.o. female presenting with asthma. The history is provided by the patient. No language interpreter was used.  Asthma   Katherine Burgess is a 31 y.o. female who presents to the Emergency Department complaining of moderate asthma for past 2 month with symptoms worsening today at [facility]. Pt reports living in a house with severe mold and that she recently moved out of the house. Reports non productive cough at night. Pt denies fevers, nausea and vomiting. Pt denies taking albuterol inhaler. Pt takes lithium 3x/day, trazodone and xanax. Pt reports drinking occasionally. She smokes cigarettes. Pt has hx of depression, panic attacks and  Bipolar disorder.   Past Medical History  Diagnosis Date  . Depression   . Asthma   . Anxiety   . Bipolar 1 disorder   . PTSD (post-traumatic stress disorder)   . Renal disorder     Past Surgical History  Procedure Date  . Kidney stone surgery     Family History  Problem Relation Age of Onset  . Thyroid disease Father   . Heart failure Father   . Diabetes Father   . Hypertension Father     History  Substance Use Topics  . Smoking status: Current Everyday Smoker -- 2.0 packs/day    Types: Cigarettes  . Smokeless tobacco: Not on file  . Alcohol Use: Yes     beer occasionally     OB History    Grav Para Term Preterm Abortions TAB SAB Ect Mult Living                  Review of Systems  Respiratory: Positive for cough and wheezing.   All other systems reviewed and are negative.    Allergies  Bactrim;  Flagyl; Onion; and Peanuts  Home Medications   Current Outpatient Rx  Name Route Sig Dispense Refill  . ALPRAZOLAM 1 MG PO TABS Oral Take 1 mg by mouth 3 (three) times daily.    Marland Kitchen CITALOPRAM HYDROBROMIDE 40 MG PO TABS Oral Take 40 mg by mouth daily.    Marland Kitchen LITHIUM CARBONATE 300 MG PO CAPS Oral Take 300 mg by mouth 3 (three) times daily with meals.    . TRAZODONE HCL 100 MG PO TABS Oral Take 200 mg by mouth at bedtime.      BP 129/62  Pulse 67  Temp 98.8 F (37.1 C) (Oral)  Resp 18  Ht 5' (1.524 m)  SpO2 100%  LMP 03/06/2012  Physical Exam  Nursing note and vitals reviewed. Constitutional: She is oriented to person, place, and time. She appears well-developed and well-nourished.  HENT:  Head: Normocephalic and atraumatic.  Right Ear: External ear normal.  Left Ear: External ear normal.  Mouth/Throat: Oropharynx is clear and moist.  Eyes: Conjunctivae are normal. Pupils are equal, round, and reactive to light.  Neck: No thyromegaly present.  Cardiovascular: Normal rate, regular rhythm, normal heart sounds and intact distal pulses.  Exam reveals no gallop and no friction rub.   No murmur heard. Pulmonary/Chest: Effort normal. No respiratory distress.  She has wheezes (moderate bilaterally).  Abdominal: Soft. Bowel sounds are normal. She exhibits no distension. There is no tenderness. There is no rebound and no guarding.  Musculoskeletal: Normal range of motion. She exhibits no edema.       Intact extremities 4x   Lymphadenopathy:    She has no cervical adenopathy.  Neurological: She is alert and oriented to person, place, and time.  Skin: Skin is warm and dry.  Psychiatric: She has a normal mood and affect. Her behavior is normal.    ED Course  Procedures (including critical care time) DIAGNOSTIC STUDIES: Oxygen Saturation is 100% on room air, normal by my interpretation.    COORDINATION OF CARE: 6:57PM  Discussed pt ED treatment with pt  7:00 Ordered:   Medications    albuterol (PROVENTIL,VENTOLIN) solution continuous neb (10 mg/hr Nebulization New Bag/Given 03/17/12 1940)  ipratropium (ATROVENT) nebulizer solution 1 mg (1 mg Nebulization Given 03/17/12 1940)  predniSONE (DELTASONE) tablet 60 mg (60 mg Oral Given 03/17/12 1924)   8:43PM  Recheck: Pt is undergoing breathing treatment. Pt is feeling better.       Labs Reviewed  POCT PREGNANCY, URINE   Dg Chest Port 1 View  03/17/2012  *RADIOLOGY REPORT*  Clinical Data: Cough and shortness of breath.  PORTABLE CHEST - 1 VIEW  Comparison: 05/28/2010.  Findings: 2031 hours. The heart size and mediastinal contours are normal. The lungs are clear. There is no pleural effusion or pneumothorax. No acute osseous findings are identified.  Telemetry leads overlie the chest.  There are bilateral nipple rings.  IMPRESSION: No active cardiopulmonary process.   Original Report Authenticated By: Gerrianne Scale, M.D.      No diagnosis found.    MDM  Pt presents for evaluation of shortness of breath.  Note bilat wheezing with stable VS - NAD.  Plan CXR, continuous neb, prednisone, u preg, and serial exams.  Pt appears nontoxic.  2050.  Wheezing now resolved.  Cont cough. Plan prescribe medrol dose pak and albuterol MDI.  Encouraged smoking cessation also.  Pt states she has moved from the environment that she believes contributed to this event.      Katherine Chad, MD 03/17/12 2101

## 2012-05-24 ENCOUNTER — Encounter (HOSPITAL_COMMUNITY): Payer: Self-pay | Admitting: *Deleted

## 2012-05-24 ENCOUNTER — Emergency Department (HOSPITAL_COMMUNITY): Payer: Medicaid Other

## 2012-05-24 ENCOUNTER — Emergency Department (HOSPITAL_COMMUNITY)
Admission: EM | Admit: 2012-05-24 | Discharge: 2012-05-25 | Disposition: A | Discharge status: Home / Self Care | Payer: Medicaid Other | Attending: Emergency Medicine | Admitting: Emergency Medicine

## 2012-05-24 DIAGNOSIS — F4310 Post-traumatic stress disorder, unspecified: Secondary | ICD-10-CM | POA: Insufficient documentation

## 2012-05-24 DIAGNOSIS — F17200 Nicotine dependence, unspecified, uncomplicated: Secondary | ICD-10-CM | POA: Insufficient documentation

## 2012-05-24 DIAGNOSIS — J45909 Unspecified asthma, uncomplicated: Secondary | ICD-10-CM | POA: Insufficient documentation

## 2012-05-24 DIAGNOSIS — Z87448 Personal history of other diseases of urinary system: Secondary | ICD-10-CM | POA: Insufficient documentation

## 2012-05-24 DIAGNOSIS — M545 Low back pain, unspecified: Secondary | ICD-10-CM | POA: Insufficient documentation

## 2012-05-24 DIAGNOSIS — N739 Female pelvic inflammatory disease, unspecified: Secondary | ICD-10-CM | POA: Insufficient documentation

## 2012-05-24 DIAGNOSIS — F341 Dysthymic disorder: Secondary | ICD-10-CM | POA: Insufficient documentation

## 2012-05-24 DIAGNOSIS — N83209 Unspecified ovarian cyst, unspecified side: Secondary | ICD-10-CM | POA: Insufficient documentation

## 2012-05-24 DIAGNOSIS — N83201 Unspecified ovarian cyst, right side: Secondary | ICD-10-CM

## 2012-05-24 DIAGNOSIS — R109 Unspecified abdominal pain: Secondary | ICD-10-CM

## 2012-05-24 DIAGNOSIS — F319 Bipolar disorder, unspecified: Secondary | ICD-10-CM | POA: Insufficient documentation

## 2012-05-24 LAB — CBC WITH DIFFERENTIAL/PLATELET
Eosinophils Absolute: 0.1 10*3/uL (ref 0.0–0.7)
Eosinophils Relative: 1 % (ref 0–5)
HCT: 37.6 % (ref 36.0–46.0)
Hemoglobin: 12.9 g/dL (ref 12.0–15.0)
Lymphs Abs: 2.6 10*3/uL (ref 0.7–4.0)
MCH: 32.4 pg (ref 26.0–34.0)
MCV: 94.5 fL (ref 78.0–100.0)
Monocytes Relative: 5 % (ref 3–12)
RBC: 3.98 MIL/uL (ref 3.87–5.11)

## 2012-05-24 LAB — COMPREHENSIVE METABOLIC PANEL
Alkaline Phosphatase: 73 U/L (ref 39–117)
BUN: 12 mg/dL (ref 6–23)
Calcium: 9.3 mg/dL (ref 8.4–10.5)
GFR calc Af Amer: >90 mL/min (ref >90)
GFR calc non Af Amer: >90 mL/min (ref >90)
Glucose, Bld: 96 mg/dL (ref 70–99)
Total Protein: 7.1 g/dL (ref 6.0–8.3)

## 2012-05-24 LAB — URINALYSIS, ROUTINE W REFLEX MICROSCOPIC
Leukocytes, UA: NEGATIVE
Nitrite: NEGATIVE
Protein, ur: NEGATIVE mg/dL
pH: 7.5 (ref 5.0–8.0)

## 2012-05-24 MED ORDER — ONDANSETRON HCL 4 MG/2ML IJ SOLN
4.0000 mg | Freq: Once | INTRAMUSCULAR | Status: AC
  Administered 2012-05-24: 4 mg via INTRAVENOUS
  Filled 2012-05-24: qty 2

## 2012-05-24 MED ORDER — HYDROMORPHONE HCL PF 1 MG/ML IJ SOLN
1.0000 mg | Freq: Once | INTRAMUSCULAR | Status: AC
  Administered 2012-05-24: 1 mg via INTRAVENOUS
  Filled 2012-05-24: qty 1

## 2012-05-24 MED ORDER — SODIUM CHLORIDE 0.9 % IV BOLUS (SEPSIS)
1000.0000 mL | Freq: Once | INTRAVENOUS | Status: AC
  Administered 2012-05-24: 1000 mL via INTRAVENOUS

## 2012-05-24 NOTE — ED Notes (Signed)
Pt presents with c/o lower abdominal pain,low back with bilateral flank pain and dysuria. Urine sample obtained and sent to lab. Pt

## 2012-05-24 NOTE — ED Notes (Signed)
Pt with abd pain and back pain x 1 week, denies N/V/D, frequent urination, denies burning on urination, c/o odor to urine

## 2012-05-24 NOTE — ED Provider Notes (Signed)
History   This chart was scribed for Glynn Octave, MD, by Marcina Millard scribe. The patient was seen in room APA18/APA18 and the patient's care was started at 2240.    CSN: [id number]  Arrival date & time 05/24/12  2040   First MD Initiated Contact with Patient 05/24/12 2240      Chief Complaint  Patient presents with  . Abdominal Pain  . Back Pain    (Consider location/radiation/quality/duration/timing/severity/associated sxs/prior treatment) HPI Comments: Katherine Burgess is a 31 y.o. female who presents to the Emergency Department complaining of moderate constant non radiating central abdominal pain that began a week ago.  She reports associated lower back pain, flank pain, polyuria, dysuria, and vaginal discharge. She denies any associated nausea, vomiting, or diarrhea. She reports that she had a fever 2-3 days ago, which she treated with Tylenol, and has since resolved itself. She reports that her last period was in the middle of Oct. She has a h/o of asthma, anxiety and depression, but has no previous surgeries.  PCP is [facility].    Past Medical History  Diagnosis Date  . Depression   . Asthma   . Anxiety   . Bipolar 1 disorder   . PTSD (post-traumatic stress disorder)   . Renal disorder     Past Surgical History  Procedure Date  . Kidney stone surgery     Family History  Problem Relation Age of Onset  . Thyroid disease Father   . Heart failure Father   . Diabetes Father   . Hypertension Father     History  Substance Use Topics  . Smoking status: Current Every Day Smoker -- 2.0 packs/day    Types: Cigarettes  . Smokeless tobacco: Not on file  . Alcohol Use: Yes     Comment: beer occasionally     OB History    Grav Para Term Preterm Abortions TAB SAB Ect Mult Living                  Review of Systems A complete 10 system review of systems was obtained and all systems are negative except as noted in the HPI and PMH.    Allergies  Bactrim; Flagyl; Onion; and Peanuts  Home Medications   Current Outpatient Rx  Name  Route  Sig  Dispense  Refill  . ALPRAZOLAM 1 MG PO TABS   Oral   Take 1 mg by mouth 3 (three) times daily.         Marland Kitchen CITALOPRAM HYDROBROMIDE 40 MG PO TABS   Oral   Take 40 mg by mouth daily.         Marland Kitchen LITHIUM CARBONATE 300 MG PO TABS   Oral   Take 300 mg by mouth 3 (three) times daily.         . TRAZODONE HCL 100 MG PO TABS   Oral   Take 200 mg by mouth at bedtime.           BP 127/70  Pulse 86  Temp 98.9 F (37.2 C) (Oral)  Resp 20  Ht 5' (1.524 m)  Wt 165 lb 9 oz (75.099 kg)  BMI 32.33 kg/m2  SpO2 98%  LMP 04/28/2012  Physical Exam  Nursing note and vitals reviewed. Constitutional: She is oriented to person, place, and time. She appears well-developed and well-nourished. No distress.  HENT:  Head: Normocephalic and atraumatic.  Eyes: EOM are normal. Pupils are equal, round, and reactive to light.  Neck: Normal range of motion. Neck supple. No tracheal deviation present.  Cardiovascular: Normal rate.   Pulmonary/Chest: Effort normal. No respiratory distress.  Abdominal: Soft. She exhibits no distension.       She has diffused lower abdominal pain.  Genitourinary: Cervix exhibits motion tenderness and discharge. Right adnexum displays tenderness and fullness. Left adnexum displays no mass and no tenderness. Vaginal discharge found.  Musculoskeletal: Normal range of motion. She exhibits no edema.       She has bilateral CVA pain.  Neurological: She is alert and oriented to person, place, and time.  Skin: Skin is warm and dry.  Psychiatric: She has a normal mood and affect. Her behavior is normal.    ED Course  Procedures (including critical care time)  DIAGNOSTIC STUDIES: Oxygen Saturation is 98% on room air, normal by my interpretation.    COORDINATION OF CARE:  22:44- Discussed planned course of treatment with the patient, including a UA, who is  agreeable at this time.    Labs Reviewed  URINALYSIS, ROUTINE W REFLEX MICROSCOPIC - Abnormal; Notable for the following:    APPearance HAZY (*)     Ketones, ur TRACE (*)     All other components within normal limits  COMPREHENSIVE METABOLIC PANEL - Abnormal; Notable for the following:    Total Bilirubin 0.1 (*)     All other components within normal limits  PREGNANCY, URINE  CBC WITH DIFFERENTIAL  GC/CHLAMYDIA PROBE AMP  WET PREP, GENITAL   Ct Abdomen Pelvis W Contrast  05/25/2012  *RADIOLOGY REPORT*  Clinical Data: Abdominal pain, back pain.  CT ABDOMEN AND PELVIS WITH CONTRAST  Technique:  Multidetector CT imaging of the abdomen and pelvis was performed following the standard protocol during bolus administration of intravenous contrast.  Contrast: OMNIPAQUE IOHEXOL 300 MG/ML  SOLN  Comparison: CT 04/05/2012  Findings: Lung bases are clear.  No effusions.  Heart is normal size.  Liver, spleen, stomach, gallbladder, pancreas, adrenals and kidneys are normal.  Moderate to large stool burden throughout the colon, particularly right side of the colon.  Small bowel is normal course and caliber. No free fluid, free air or adenopathy.  Enlarging right adnexal cyst, now measuring up to 4.2 cm compared to 2.7 cm previously.  This measured 4.5 cm on more remote study from 01/15/2012.  Uterus and left adnexa unremarkable.  Large calcified phleboliths in the pelvis bilaterally.  Urinary bladder is unremarkable.  Aorta is normal caliber.  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  No acute bony abnormality.  Sclerosis around the SI joints bilaterally, stable.  This is greatest on the left.  IMPRESSION: Waxing and waning right adnexal cyst.  This currently measures 4.2 cm, enlarged since prior study.   Original Report Authenticated By: Charlett Nose, M.D.      1. Pelvic inflammatory disease   2. Abdominal  pain, other specified site       MDM  One week of lower abdominal pain, low  back pain associated with nausea, frequent urination, urgency. Complaining of vaginal discharge and odor. No fever, chills, diarrhea, chest pain or shortness of breath.  UA negative, hCG negative. Pelvic exam consistent with PID. CMT and R adnexal pain. CT scan shows enlarging right-sided ovarian cysts. Will obtain ultrasound to rule out tubo-ovarian abscess or ovarian torsion. Care signed out to Dr. Colon Branch at change of shift.  I personally performed the services described in this documentation, which was scribed in my presence. The recorded information has been  reviewed and is accurate.        Glynn Octave, MD 05/25/12 [phone]

## 2012-05-25 ENCOUNTER — Emergency Department (HOSPITAL_COMMUNITY): Payer: Medicaid Other

## 2012-05-25 MED ORDER — AZITHROMYCIN 250 MG PO TABS
1000.0000 mg | ORAL_TABLET | Freq: Once | ORAL | Status: AC
  Administered 2012-05-25: 1000 mg via ORAL
  Filled 2012-05-25: qty 4

## 2012-05-25 MED ORDER — CEFTRIAXONE SODIUM 250 MG IJ SOLR
250.0000 mg | Freq: Once | INTRAMUSCULAR | Status: AC
  Administered 2012-05-25: 250 mg via INTRAMUSCULAR
  Filled 2012-05-25: qty 250

## 2012-05-25 MED ORDER — LIDOCAINE HCL (PF) 1 % IJ SOLN
INTRAMUSCULAR | Status: AC
  Administered 2012-05-25: 5 mL
  Filled 2012-05-25: qty 5

## 2012-05-25 MED ORDER — HYDROMORPHONE HCL PF 1 MG/ML IJ SOLN
1.0000 mg | Freq: Once | INTRAMUSCULAR | Status: AC
  Administered 2012-05-25: 1 mg via INTRAVENOUS
  Filled 2012-05-25: qty 1

## 2012-05-25 MED ORDER — HYDROCODONE-ACETAMINOPHEN 5-325 MG PO TABS: 1.0000 | ORAL_TABLET | ORAL | Status: AC | PRN

## 2012-05-25 MED ORDER — ONDANSETRON HCL 4 MG PO TABS: 4.0000 mg | ORAL_TABLET | Freq: Four times a day (QID) | ORAL | Status: DC

## 2012-05-25 MED ORDER — IOHEXOL 300 MG/ML  SOLN
100.0000 mL | Freq: Once | INTRAMUSCULAR | Status: AC | PRN
  Administered 2012-05-25: 100 mL via INTRAVENOUS

## 2012-05-25 NOTE — ED Provider Notes (Signed)
0100 Assumed care/disposition of patient who presents with right lower abdominal pain with radiation to her back. UA negative, HCG negative. CT with enlarging right ovarian cysts. Right adnexal pain on physical exam performed by Dr. Manus Gunning. Treated for PID. Awaiting [place] to r/o torsion or TOA. 0330  [place] negative for torsion. Does confirm cystic lesions on both the right and left ovary. Reviewed results with patient. Referral to OB/GYN for follow up. Rx for analgesics and antiemetics.Discharged home.        Nicoletta Dress. Colon Branch, MD 05/25/12 [phone]

## 2012-05-25 NOTE — ED Notes (Signed)
Pt discharged. Pt stable at time of discharge. Medications reviewed pt has no questions regarding discharge at this time. Pt voiced understanding of discharge instructions.  

## 2012-05-26 LAB — GC/CHLAMYDIA PROBE AMP, GENITAL: GC Probe Amp, Genital: NEGATIVE

## 2012-06-03 ENCOUNTER — Encounter (HOSPITAL_COMMUNITY): Payer: Self-pay | Admitting: *Deleted

## 2012-06-03 ENCOUNTER — Emergency Department (HOSPITAL_COMMUNITY)
Admission: EM | Admit: 2012-06-03 | Discharge: 2012-06-03 | Disposition: A | Discharge status: Home / Self Care | Payer: Medicaid Other | Attending: Emergency Medicine | Admitting: Emergency Medicine

## 2012-06-03 DIAGNOSIS — F3289 Other specified depressive episodes: Secondary | ICD-10-CM | POA: Insufficient documentation

## 2012-06-03 DIAGNOSIS — R109 Unspecified abdominal pain: Secondary | ICD-10-CM | POA: Insufficient documentation

## 2012-06-03 DIAGNOSIS — J45909 Unspecified asthma, uncomplicated: Secondary | ICD-10-CM | POA: Insufficient documentation

## 2012-06-03 DIAGNOSIS — M549 Dorsalgia, unspecified: Secondary | ICD-10-CM | POA: Insufficient documentation

## 2012-06-03 DIAGNOSIS — F17200 Nicotine dependence, unspecified, uncomplicated: Secondary | ICD-10-CM | POA: Insufficient documentation

## 2012-06-03 DIAGNOSIS — Z87448 Personal history of other diseases of urinary system: Secondary | ICD-10-CM | POA: Insufficient documentation

## 2012-06-03 DIAGNOSIS — F329 Major depressive disorder, single episode, unspecified: Secondary | ICD-10-CM | POA: Insufficient documentation

## 2012-06-03 DIAGNOSIS — N83209 Unspecified ovarian cyst, unspecified side: Secondary | ICD-10-CM | POA: Insufficient documentation

## 2012-06-03 DIAGNOSIS — F411 Generalized anxiety disorder: Secondary | ICD-10-CM | POA: Insufficient documentation

## 2012-06-03 DIAGNOSIS — F4310 Post-traumatic stress disorder, unspecified: Secondary | ICD-10-CM | POA: Insufficient documentation

## 2012-06-03 DIAGNOSIS — F319 Bipolar disorder, unspecified: Secondary | ICD-10-CM | POA: Insufficient documentation

## 2012-06-03 DIAGNOSIS — Z79899 Other long term (current) drug therapy: Secondary | ICD-10-CM | POA: Insufficient documentation

## 2012-06-03 DIAGNOSIS — N83201 Unspecified ovarian cyst, right side: Secondary | ICD-10-CM

## 2012-06-03 DIAGNOSIS — R102 Pelvic and perineal pain: Secondary | ICD-10-CM

## 2012-06-03 DIAGNOSIS — N949 Unspecified condition associated with female genital organs and menstrual cycle: Secondary | ICD-10-CM | POA: Insufficient documentation

## 2012-06-03 LAB — URINALYSIS, ROUTINE W REFLEX MICROSCOPIC
Bilirubin Urine: NEGATIVE
Leukocytes, UA: NEGATIVE
Nitrite: NEGATIVE
Specific Gravity, Urine: 1.025 (ref 1.005–1.030)
pH: 6.5 (ref 5.0–8.0)

## 2012-06-03 MED ORDER — HYDROMORPHONE HCL PF 2 MG/ML IJ SOLN
INTRAMUSCULAR | Status: AC
  Administered 2012-06-03: 2 mg
  Filled 2012-06-03: qty 1

## 2012-06-03 MED ORDER — OXYCODONE-ACETAMINOPHEN 5-325 MG PO TABS: 1.0000 | ORAL_TABLET | ORAL | Status: DC | PRN

## 2012-06-03 NOTE — ED Provider Notes (Signed)
History     CSN: [id number]  Arrival date & time 06/03/12  0228   First MD Initiated Contact with Patient 06/03/12 0239      Chief Complaint  Patient presents with  . Abdominal Pain  . Back Pain  . Pelvic Pain    (Consider location/radiation/quality/duration/timing/severity/associated sxs/prior treatment) HPI Comments: 31-year-old female presents with a complaint of lower pelvic pain and back pain. She states that she has had this pain for greater than 2 weeks, it is persistent, waxes and wanes in intensity and is not associated with nausea or vomiting. She has been able to have bowel movements, she is passing urine without burning or dysuria and has no fevers. She was seen for this same complaint approximately 9 days ago when she had an ultrasound and a CT scan showing that she had ovarian cysts but no signs of appendicitis or other malignancy or abnormalities. She states that her pain is similar today than it was at that time.  Patient is a 31 y.o. female presenting with abdominal pain, back pain, and pelvic pain. The history is provided by the patient, a relative and medical records.  Abdominal Pain The primary symptoms of the illness include abdominal pain.  Additional symptoms associated with the illness include back pain.  Back Pain  Associated symptoms include abdominal pain and pelvic pain.  Pelvic Pain Associated symptoms include abdominal pain.    Past Medical History  Diagnosis Date  . Depression   . Asthma   . Anxiety   . Bipolar 1 disorder   . PTSD (post-traumatic stress disorder)   . Renal disorder     Past Surgical History  Procedure Date  . Kidney stone surgery     Family History  Problem Relation Age of Onset  . Thyroid disease Father   . Heart failure Father   . Diabetes Father   . Hypertension Father     History  Substance Use Topics  . Smoking status: Current Every Day Smoker -- 2.0 packs/day    Types: Cigarettes  . Smokeless tobacco: Not on  file  . Alcohol Use: Yes     Comment: beer occasionally     OB History    Grav Para Term Preterm Abortions TAB SAB Ect Mult Living                  Review of Systems  Gastrointestinal: Positive for abdominal pain.  Genitourinary: Positive for pelvic pain.  Musculoskeletal: Positive for back pain.  All other systems reviewed and are negative.    Allergies  Bactrim; Flagyl; Onion; and Peanuts  Home Medications   Current Outpatient Rx  Name  Route  Sig  Dispense  Refill  . ALPRAZOLAM 1 MG PO TABS   Oral   Take 1 mg by mouth 3 (three) times daily.         Marland Kitchen CITALOPRAM HYDROBROMIDE 40 MG PO TABS   Oral   Take 40 mg by mouth daily.         Marland Kitchen LITHIUM CARBONATE 300 MG PO TABS   Oral   Take 300 mg by mouth 3 (three) times daily.         Marland Kitchen ONDANSETRON HCL 4 MG PO TABS   Oral   Take 1 tablet (4 mg total) by mouth every 6 (six) hours.   12 tablet   0   . OXYCODONE-ACETAMINOPHEN 5-325 MG PO TABS   Oral   Take 1 tablet by mouth every 4 (four) hours  as needed for pain.   20 tablet   0   . TRAZODONE HCL 100 MG PO TABS   Oral   Take 200 mg by mouth at bedtime.           BP 120/70  Pulse 73  Temp 98.4 F (36.9 C) (Oral)  Resp 20  Ht 5' (1.524 m)  Wt 165 lb 9 oz (75.099 kg)  BMI 32.33 kg/m2  SpO2 97%  LMP 05/29/2012  Physical Exam  Nursing note and vitals reviewed. Constitutional: She appears well-developed and well-nourished. No distress.  HENT:  Head: Normocephalic and atraumatic.  Mouth/Throat: Oropharynx is clear and moist. No oropharyngeal exudate.  Eyes: Conjunctivae normal and EOM are normal. Pupils are equal, round, and reactive to light. Right eye exhibits no discharge. Left eye exhibits no discharge. No scleral icterus.  Neck: Normal range of motion. Neck supple. No JVD present. No thyromegaly present.  Cardiovascular: Normal rate, regular rhythm, normal heart sounds and intact distal pulses.  Exam reveals no gallop and no friction rub.   No  murmur heard. Pulmonary/Chest: Effort normal and breath sounds normal. No respiratory distress. She has no wheezes. She has no rales.  Abdominal: Soft. Bowel sounds are normal. She exhibits no distension and no mass. There is tenderness ( Local tenderness to palpation in the bilateral pelvis, no asymmetry, no guarding, no peritoneal signs).  Musculoskeletal: Normal range of motion. She exhibits no edema and no tenderness.  Lymphadenopathy:    She has no cervical adenopathy.  Neurological: She is alert. Coordination normal.  Skin: Skin is warm and dry. No rash noted. No erythema.  Psychiatric: She has a normal mood and affect. Her behavior is normal.    ED Course  Procedures (including critical care time)   Labs Reviewed  URINALYSIS, ROUTINE W REFLEX MICROSCOPIC  LAB REPORT - SCANNED   No results found.   1. Pelvic pain   2. Bilateral ovarian cysts       MDM  Overall the patient is well-appearing, she has normal vital signs, she is afebrile, she has no peritoneal signs. I suspect that she has ongoing symptoms related to ovarian cysts and will need to see an OB/GYN consultation. This can be done as an outpatient, she has been given intramuscular Dilaudid and will be discharged with a prescription for Percocet. She has taken some hydrocodone the last week and has not had much relief from this. At this time I do not believe that the patient has a emergent surgical abnormality.        Vida Roller, MD 06/07/12 [phone]

## 2012-06-13 ENCOUNTER — Emergency Department (HOSPITAL_COMMUNITY)
Admission: EM | Admit: 2012-06-13 | Discharge: 2012-06-13 | Disposition: A | Discharge status: Home / Self Care | Payer: Self-pay | Attending: Emergency Medicine | Admitting: Emergency Medicine

## 2012-06-13 ENCOUNTER — Emergency Department (HOSPITAL_COMMUNITY): Payer: Self-pay

## 2012-06-13 ENCOUNTER — Encounter (HOSPITAL_COMMUNITY): Payer: Self-pay | Admitting: *Deleted

## 2012-06-13 DIAGNOSIS — M25559 Pain in unspecified hip: Secondary | ICD-10-CM | POA: Insufficient documentation

## 2012-06-13 DIAGNOSIS — Z79899 Other long term (current) drug therapy: Secondary | ICD-10-CM | POA: Insufficient documentation

## 2012-06-13 DIAGNOSIS — F319 Bipolar disorder, unspecified: Secondary | ICD-10-CM | POA: Insufficient documentation

## 2012-06-13 DIAGNOSIS — Z87448 Personal history of other diseases of urinary system: Secondary | ICD-10-CM | POA: Insufficient documentation

## 2012-06-13 DIAGNOSIS — J45909 Unspecified asthma, uncomplicated: Secondary | ICD-10-CM | POA: Insufficient documentation

## 2012-06-13 DIAGNOSIS — F411 Generalized anxiety disorder: Secondary | ICD-10-CM | POA: Insufficient documentation

## 2012-06-13 DIAGNOSIS — F3289 Other specified depressive episodes: Secondary | ICD-10-CM | POA: Insufficient documentation

## 2012-06-13 DIAGNOSIS — M549 Dorsalgia, unspecified: Secondary | ICD-10-CM | POA: Insufficient documentation

## 2012-06-13 DIAGNOSIS — Z8659 Personal history of other mental and behavioral disorders: Secondary | ICD-10-CM | POA: Insufficient documentation

## 2012-06-13 DIAGNOSIS — E860 Dehydration: Secondary | ICD-10-CM | POA: Insufficient documentation

## 2012-06-13 DIAGNOSIS — F17200 Nicotine dependence, unspecified, uncomplicated: Secondary | ICD-10-CM | POA: Insufficient documentation

## 2012-06-13 DIAGNOSIS — R102 Pelvic and perineal pain: Secondary | ICD-10-CM

## 2012-06-13 DIAGNOSIS — F329 Major depressive disorder, single episode, unspecified: Secondary | ICD-10-CM | POA: Insufficient documentation

## 2012-06-13 LAB — URINALYSIS, ROUTINE W REFLEX MICROSCOPIC
Bilirubin Urine: NEGATIVE
Glucose, UA: NEGATIVE mg/dL
Hgb urine dipstick: NEGATIVE
Ketones, ur: NEGATIVE mg/dL
pH: 6 (ref 5.0–8.0)

## 2012-06-13 LAB — WET PREP, GENITAL
Trich, Wet Prep: NONE SEEN
Yeast Wet Prep HPF POC: NONE SEEN

## 2012-06-13 MED ORDER — OXYCODONE-ACETAMINOPHEN 5-325 MG PO TABS: 1.0000 | ORAL_TABLET | ORAL | Status: DC | PRN

## 2012-06-13 MED ORDER — NAPROXEN 500 MG PO TABS: 500.0000 mg | ORAL_TABLET | Freq: Two times a day (BID) | ORAL | Status: DC

## 2012-06-13 MED ORDER — ONDANSETRON 8 MG PO TBDP
8.0000 mg | ORAL_TABLET | Freq: Once | ORAL | Status: AC
  Administered 2012-06-13: 8 mg via ORAL
  Filled 2012-06-13: qty 1

## 2012-06-13 MED ORDER — OXYCODONE-ACETAMINOPHEN 5-325 MG PO TABS
2.0000 | ORAL_TABLET | Freq: Once | ORAL | Status: AC
  Administered 2012-06-13: 2 via ORAL
  Filled 2012-06-13: qty 2

## 2012-06-13 MED ORDER — PROMETHAZINE HCL 25 MG PO TABS: 25.0000 mg | ORAL_TABLET | Freq: Four times a day (QID) | ORAL | Status: DC | PRN

## 2012-06-13 NOTE — ED Notes (Signed)
Pt discharged. Pt stable at time of discharge. Medications reviewed pt has no questions regarding discharge at this time. Pt voiced understanding of discharge instructions.  

## 2012-06-13 NOTE — ED Notes (Signed)
Pt c/o abd pain and back pain. Pt states she is also having foul odder and discharge from vagina. Pt states her abd pain got worse after having dinner last pm.

## 2012-06-13 NOTE — ED Provider Notes (Signed)
History     CSN: [id number]  Arrival date & time 06/13/12  0231   First MD Initiated Contact with Patient 06/13/12 [phone]      Chief Complaint  Patient presents with  . Abdominal Pain  . Back Pain    (Consider location/radiation/quality/duration/timing/severity/associated sxs/prior treatment) HPI Comments: 31 year old female with a history of ovarian cysts bilaterally, bipolar disorder, posttraumatic stress disorder, kidney stones. She presents with a complaint of lower abdominal pain which has been present for several weeks. It fluctuates from day to day and today the pain has been more intense than usual. She does have some associated nausea and has been having a foul odor and vaginal discharge. Nothing seems to make his pain better, it is worse with palpation of the abdomen, it is associated with a feeling of being distended and gassy. She has had excessive belching. Review of the medical record shows that the patient was recently tested in the last month for gonorrhea, Chlamydia and a wet prep which showed a few clue cells but no other signs of infection. She has also had a CT scan and an abdominal ultrasound which confirmed her ovarian cysts.  Patient is a 31 y.o. female presenting with abdominal pain and back pain. The history is provided by the patient and medical records.  Abdominal Pain The primary symptoms of the illness include abdominal pain.  Additional symptoms associated with the illness include back pain.  Back Pain  Associated symptoms include abdominal pain.    Past Medical History  Diagnosis Date  . Depression   . Asthma   . Anxiety   . Bipolar 1 disorder   . PTSD (post-traumatic stress disorder)   . Renal disorder     Past Surgical History  Procedure Date  . Kidney stone surgery     Family History  Problem Relation Age of Onset  . Thyroid disease Father   . Heart failure Father   . Diabetes Father   . Hypertension Father     History  Substance Use  Topics  . Smoking status: Current Every Day Smoker -- 2.0 packs/day    Types: Cigarettes  . Smokeless tobacco: Not on file  . Alcohol Use: Yes     Comment: beer occasionally     OB History    Grav Para Term Preterm Abortions TAB SAB Ect Mult Living                  Review of Systems  Gastrointestinal: Positive for abdominal pain.  Musculoskeletal: Positive for back pain.  All other systems reviewed and are negative.    Allergies  Bactrim; Flagyl; Onion; and Peanuts  Home Medications   Current Outpatient Rx  Name  Route  Sig  Dispense  Refill  . ALPRAZOLAM 1 MG PO TABS   Oral   Take 1 mg by mouth 3 (three) times daily.         Marland Kitchen CITALOPRAM HYDROBROMIDE 40 MG PO TABS   Oral   Take 40 mg by mouth daily.         Marland Kitchen LITHIUM CARBONATE 300 MG PO TABS   Oral   Take 300 mg by mouth 3 (three) times daily.         Marland Kitchen NAPROXEN 500 MG PO TABS   Oral   Take 1 tablet (500 mg total) by mouth 2 (two) times daily with a meal.   30 tablet   0   . ONDANSETRON HCL 4 MG PO TABS  Oral   Take 1 tablet (4 mg total) by mouth every 6 (six) hours.   12 tablet   0   . OXYCODONE-ACETAMINOPHEN 5-325 MG PO TABS   Oral   Take 1 tablet by mouth every 4 (four) hours as needed for pain.   20 tablet   0   . OXYCODONE-ACETAMINOPHEN 5-325 MG PO TABS   Oral   Take 1 tablet by mouth every 4 (four) hours as needed for pain.   20 tablet   0   . PROMETHAZINE HCL 25 MG PO TABS   Oral   Take 1 tablet (25 mg total) by mouth every 6 (six) hours as needed for nausea.   12 tablet   0   . TRAZODONE HCL 100 MG PO TABS   Oral   Take 200 mg by mouth at bedtime.           BP 104/57  Pulse 74  Temp 98.4 F (36.9 C) (Oral)  Resp 18  Ht 5' (1.524 m)  Wt 165 lb 14 oz (75.24 kg)  BMI 32.40 kg/m2  SpO2 97%  LMP 05/29/2012  Physical Exam  Nursing note and vitals reviewed. Constitutional: She appears well-developed and well-nourished. No distress.  HENT:  Head: Normocephalic and  atraumatic.  Mouth/Throat: Oropharynx is clear and moist. No oropharyngeal exudate.  Eyes: Conjunctivae normal and EOM are normal. Pupils are equal, round, and reactive to light. Right eye exhibits no discharge. Left eye exhibits no discharge. No scleral icterus.  Neck: Normal range of motion. Neck supple. No JVD present. No thyromegaly present.  Cardiovascular: Normal rate, regular rhythm, normal heart sounds and intact distal pulses.  Exam reveals no gallop and no friction rub.   No murmur heard. Pulmonary/Chest: Effort normal and breath sounds normal. No respiratory distress. She has no wheezes. She has no rales.  Abdominal: Soft. Bowel sounds are normal. She exhibits distension ( minimal abdominal distention, no tympanitic sounds to percussion). She exhibits no mass. There is tenderness ( bilateral lower abdominal and pelvic tenderness, no guarding, no rebound, no masses, no peritoneal signs).  Genitourinary:       Chaperone present for entire exam.  Normal appearing external genitalia, clitoral hood as piercing present, no signs of drainage redness or discharge. Internal exam shows frothy cream-colored discharge, no foul odor, no bleeding, bimanual exam with diffuse tenderness everywhere that is palpated. This includes the outer vaginal walls, adnexa and cervix, no masses palpated  Musculoskeletal: Normal range of motion. She exhibits no edema and no tenderness.  Lymphadenopathy:    She has no cervical adenopathy.  Neurological: She is alert. Coordination normal.  Skin: Skin is warm and dry. No rash noted. No erythema.  Psychiatric: She has a normal mood and affect. Her behavior is normal.    ED Course  Procedures (including critical care time)  Labs Reviewed  WET PREP, GENITAL - Abnormal; Notable for the following:    Clue Cells Wet Prep HPF POC FEW (*)     WBC, Wet Prep HPF POC FEW (*)     All other components within normal limits  URINALYSIS, ROUTINE W REFLEX MICROSCOPIC - Abnormal;  Notable for the following:    APPearance CLOUDY (*)     Specific Gravity, Urine >1.030 (*)     All other components within normal limits  PREGNANCY, URINE  GC/CHLAMYDIA PROBE AMP   Dg Abd Acute W/chest  06/13/2012  *RADIOLOGY REPORT*  Clinical Data: Abdominal distension, abdominal pain and back pain. History of  smoking and asthma.  ACUTE ABDOMEN SERIES (ABDOMEN 2 VIEW & CHEST 1 VIEW)  Comparison: Chest radiograph performed 03/17/2012, and CT of the abdomen and pelvis performed 05/25/2012  Findings: The lungs are well-aerated and clear.  There is no evidence of focal opacification, pleural effusion or pneumothorax. The cardiomediastinal silhouette is within normal limits.  The visualized bowel gas pattern is unremarkable.  Scattered stool and air are seen within the colon; there is no evidence of small bowel dilatation to suggest obstruction.  No free intra-abdominal air is identified on the provided upright view.  No acute osseous abnormalities are seen; the sacroiliac joints are unremarkable in appearance.  Bilateral nipple piercings and an umbilical piercing are noted.  IMPRESSION:  1.  Unremarkable bowel gas pattern; no free intra-abdominal air seen. 2.  No acute cardiopulmonary process identified.   Original Report Authenticated By: Tonia Ghent, M.D.      1. Pelvic pain   2. Dehydration       MDM  Vital signs are normal, abdomen is benign, pain x-ray to rule out distention of small bowel, significant constipation as the patient states she has been having few and far between small hard stools. There has been no bloody stools and she does not have an acute abdomen at this time. Will repeat pelvic exam as she has worsening in her vaginal discharge.  The patient has been reevaluated after medication and feels better. She still has mild pelvic tenderness. I have reviewed her labs and the signs of dehydration including a high specific gravity. There is no ketonuria, vaginal specimen shows no  signs of Trichomonas or yeast, there is a small amount of bacterial vaginosis. At this time it is unlikely that that is causing the pain that she is having. Will defer treatment of OB/GYN as the lab tests shows a mild presence of bacteria. Again given her history of a subacute pain over some time with 2 prior evaluations this month one which had a CT scan and ultrasound neither of which showed pathologic abnormal abdominal problems, I doubt that there is an acute process requiring surgical intervention for acute medical intervention today.Vital signs are normal, OB/GYN followup recommended, phone numbers given, home with medications including the following:  Naprosyn  Percocet.      Vida Roller, MD 06/13/12 [phone]

## 2012-06-15 LAB — GC/CHLAMYDIA PROBE AMP
CT Probe RNA: NEGATIVE
GC Probe RNA: NEGATIVE

## 2012-06-22 ENCOUNTER — Emergency Department (HOSPITAL_COMMUNITY)
Admission: EM | Admit: 2012-06-22 | Discharge: 2012-06-22 | Disposition: A | Discharge status: Home / Self Care | Payer: Self-pay | Attending: Emergency Medicine | Admitting: Emergency Medicine

## 2012-06-22 DIAGNOSIS — R112 Nausea with vomiting, unspecified: Secondary | ICD-10-CM | POA: Insufficient documentation

## 2012-06-22 DIAGNOSIS — F319 Bipolar disorder, unspecified: Secondary | ICD-10-CM | POA: Insufficient documentation

## 2012-06-22 DIAGNOSIS — F411 Generalized anxiety disorder: Secondary | ICD-10-CM | POA: Insufficient documentation

## 2012-06-22 DIAGNOSIS — Z87448 Personal history of other diseases of urinary system: Secondary | ICD-10-CM | POA: Insufficient documentation

## 2012-06-22 DIAGNOSIS — Z3202 Encounter for pregnancy test, result negative: Secondary | ICD-10-CM | POA: Insufficient documentation

## 2012-06-22 DIAGNOSIS — F329 Major depressive disorder, single episode, unspecified: Secondary | ICD-10-CM | POA: Insufficient documentation

## 2012-06-22 DIAGNOSIS — J45909 Unspecified asthma, uncomplicated: Secondary | ICD-10-CM | POA: Insufficient documentation

## 2012-06-22 DIAGNOSIS — F3289 Other specified depressive episodes: Secondary | ICD-10-CM | POA: Insufficient documentation

## 2012-06-22 DIAGNOSIS — N83209 Unspecified ovarian cyst, unspecified side: Secondary | ICD-10-CM | POA: Insufficient documentation

## 2012-06-22 DIAGNOSIS — F17200 Nicotine dependence, unspecified, uncomplicated: Secondary | ICD-10-CM | POA: Insufficient documentation

## 2012-06-22 DIAGNOSIS — R109 Unspecified abdominal pain: Secondary | ICD-10-CM

## 2012-06-22 DIAGNOSIS — F4310 Post-traumatic stress disorder, unspecified: Secondary | ICD-10-CM | POA: Insufficient documentation

## 2012-06-22 DIAGNOSIS — Z79899 Other long term (current) drug therapy: Secondary | ICD-10-CM | POA: Insufficient documentation

## 2012-06-22 DIAGNOSIS — N949 Unspecified condition associated with female genital organs and menstrual cycle: Secondary | ICD-10-CM | POA: Insufficient documentation

## 2012-06-22 LAB — URINALYSIS, ROUTINE W REFLEX MICROSCOPIC
Glucose, UA: NEGATIVE mg/dL
Hgb urine dipstick: NEGATIVE
Leukocytes, UA: NEGATIVE
Specific Gravity, Urine: 1.02 (ref 1.005–1.030)

## 2012-06-22 LAB — POCT PREGNANCY, URINE: Preg Test, Ur: NEGATIVE

## 2012-06-22 MED ORDER — OXYCODONE-ACETAMINOPHEN 5-325 MG PO TABS
1.0000 | ORAL_TABLET | Freq: Once | ORAL | Status: AC
  Administered 2012-06-22: 1 via ORAL
  Filled 2012-06-22: qty 1

## 2012-06-22 NOTE — ED Notes (Signed)
Pt reports abd distention x several months; states has been dx with bilateral ovarian cysts, and has been seen here several times for same; states has not f/u with OBGYN d/t unable to afford payment up front.

## 2012-06-22 NOTE — ED Provider Notes (Signed)
History     CSN: [id number]  Arrival date & time 06/22/12  1652   First MD Initiated Contact with Patient 06/22/12 1839      Chief Complaint  Patient presents with  . Abdominal Pain     Patient is a 31 y.o. female presenting with abdominal pain.  Abdominal Pain The primary symptoms of the illness include abdominal pain, nausea and vomiting. The primary symptoms of the illness do not include fever, diarrhea, dysuria or vaginal bleeding. The current episode started more than 2 days ago. The onset of the illness was gradual. The problem has not changed since onset. Symptoms associated with the illness do not include back pain.  pt presents for lower abdominal pain which she feels is due to her ovarian cysts She also reports wt gain over the past month She denies vag bleeding No fever She reports she feels distended She reports she has known ovarian cysts but is unable to followup with GYN due to financial reason  Past Medical History  Diagnosis Date  . Depression   . Asthma   . Anxiety   . Bipolar 1 disorder   . PTSD (post-traumatic stress disorder)   . Renal disorder     Past Surgical History  Procedure Date  . Kidney stone surgery     Family History  Problem Relation Age of Onset  . Thyroid disease Father   . Heart failure Father   . Diabetes Father   . Hypertension Father     History  Substance Use Topics  . Smoking status: Current Every Day Smoker -- 2.0 packs/day    Types: Cigarettes  . Smokeless tobacco: Not on file  . Alcohol Use: Yes     Comment: beer occasionally     OB History    Grav Para Term Preterm Abortions TAB SAB Ect Mult Living                  Review of Systems  Constitutional: Negative for fever.  Gastrointestinal: Positive for nausea, vomiting and abdominal pain. Negative for diarrhea.  Genitourinary: Positive for pelvic pain. Negative for dysuria and vaginal bleeding.  Musculoskeletal: Negative for back pain.  Neurological:  Negative for weakness.  Psychiatric/Behavioral: Negative for agitation.    Allergies  Bactrim; Flagyl; Onion; and Peanuts  Home Medications   Current Outpatient Rx  Name  Route  Sig  Dispense  Refill  . ALPRAZOLAM 0.5 MG PO TABS   Oral   Take 0.5 mg by mouth 4 (four) times daily.         Marland Kitchen CITALOPRAM HYDROBROMIDE 40 MG PO TABS   Oral   Take 40 mg by mouth daily.         Marland Kitchen LITHIUM CARBONATE 300 MG PO TABS   Oral   Take 300 mg by mouth 3 (three) times daily.         Marland Kitchen PROMETHAZINE HCL 25 MG PO TABS   Oral   Take 1 tablet (25 mg total) by mouth every 6 (six) hours as needed for nausea.   12 tablet   0   . TRAZODONE HCL 100 MG PO TABS   Oral   Take 200 mg by mouth at bedtime.           BP 121/66  Pulse 66  Temp 98.2 F (36.8 C) (Oral)  Resp 20  Ht 5' (1.524 m)  Wt 173 lb (78.472 kg)  BMI 33.79 kg/m2  SpO2 100%  LMP 06/03/2012  Physical  Exam CONSTITUTIONAL: Well developed/well nourished HEAD AND FACE: Normocephalic/atraumatic EYES: EOMI/PERRL ENMT: Mucous membranes moist NECK: supple no meningeal signs SPINE:entire spine nontender CV: S1/S2 noted, no murmurs/rubs/gallops noted LUNGS: Lungs are clear to auscultation bilaterally, no apparent distress ABDOMEN: soft, nontender, no rebound or guarding.  She does appear overweight GU:no cva tenderness NEURO: Pt is awake/alert, moves all extremitiesx4 EXTREMITIES: pulses normal, full ROM SKIN: warm, color normal PSYCH: no abnormalities of mood noted  ED Course  Procedures   Labs Reviewed  URINALYSIS, ROUTINE W REFLEX MICROSCOPIC  POCT PREGNANCY, URINE     1. Abdominal pain   2. Ovarian cyst    8:10 PM Previous records reveal she had pelvic exam on 12/1 and has had negative Gc/chlam results She denies acute onset of pain today.  This has been ongoing for weeks She has been found to have adenxal cystic lesions and has been treated for PID in the past For her wt gain, on 12/1 she was listed as  weighing 165 and today she is 173 I spoke to dr Despina Hidden with gynecology and we discussed recent CT/US imaging results and her labs He does not feel any acute treatment/imaging/labs are needed at this time  Advised to f/u as outpatient She has ride home Defer outpatient meds at this time  MDM  Nursing notes including past medical history and social history reviewed and considered in documentation Labs/vital reviewed and considered Previous records reviewed and considered Narcotic database reviewed        Joya Gaskins, MD 06/22/12 2013

## 2012-08-06 ENCOUNTER — Emergency Department (HOSPITAL_COMMUNITY)
Admission: EM | Admit: 2012-08-06 | Discharge: 2012-08-06 | Disposition: A | Discharge status: Home / Self Care | Payer: Medicaid Other | Attending: Emergency Medicine | Admitting: Emergency Medicine

## 2012-08-06 ENCOUNTER — Emergency Department (HOSPITAL_COMMUNITY): Payer: Medicaid Other

## 2012-08-06 ENCOUNTER — Encounter (HOSPITAL_COMMUNITY): Payer: Self-pay | Admitting: *Deleted

## 2012-08-06 DIAGNOSIS — F17200 Nicotine dependence, unspecified, uncomplicated: Secondary | ICD-10-CM | POA: Insufficient documentation

## 2012-08-06 DIAGNOSIS — F319 Bipolar disorder, unspecified: Secondary | ICD-10-CM | POA: Insufficient documentation

## 2012-08-06 DIAGNOSIS — F3289 Other specified depressive episodes: Secondary | ICD-10-CM | POA: Insufficient documentation

## 2012-08-06 DIAGNOSIS — Z79899 Other long term (current) drug therapy: Secondary | ICD-10-CM | POA: Insufficient documentation

## 2012-08-06 DIAGNOSIS — J45909 Unspecified asthma, uncomplicated: Secondary | ICD-10-CM | POA: Insufficient documentation

## 2012-08-06 DIAGNOSIS — R079 Chest pain, unspecified: Secondary | ICD-10-CM | POA: Insufficient documentation

## 2012-08-06 DIAGNOSIS — F419 Anxiety disorder, unspecified: Secondary | ICD-10-CM

## 2012-08-06 DIAGNOSIS — F411 Generalized anxiety disorder: Secondary | ICD-10-CM | POA: Insufficient documentation

## 2012-08-06 DIAGNOSIS — Z87448 Personal history of other diseases of urinary system: Secondary | ICD-10-CM | POA: Insufficient documentation

## 2012-08-06 DIAGNOSIS — F329 Major depressive disorder, single episode, unspecified: Secondary | ICD-10-CM | POA: Insufficient documentation

## 2012-08-06 DIAGNOSIS — F4310 Post-traumatic stress disorder, unspecified: Secondary | ICD-10-CM | POA: Insufficient documentation

## 2012-08-06 LAB — BASIC METABOLIC PANEL
BUN: 17 mg/dL (ref 6–23)
CO2: 25 mEq/L (ref 19–32)
Calcium: 9.9 mg/dL (ref 8.4–10.5)
Chloride: 101 mEq/L (ref 96–112)
Creatinine, Ser: 0.83 mg/dL (ref 0.50–1.10)
GFR calc Af Amer: >90 mL/min (ref >90)
GFR calc non Af Amer: >90 mL/min (ref >90)
Glucose, Bld: 92 mg/dL (ref 70–99)
Potassium: 3.7 mEq/L (ref 3.5–5.1)
Sodium: 136 mEq/L (ref 135–145)

## 2012-08-06 LAB — CBC
HCT: 40.5 % (ref 36.0–46.0)
Hemoglobin: 13.6 g/dL (ref 12.0–15.0)
MCH: 32.4 pg (ref 26.0–34.0)
MCHC: 33.6 g/dL (ref 30.0–36.0)
MCV: 96.4 fL (ref 78.0–100.0)
Platelets: 354 10*3/uL (ref 150–400)
RBC: 4.2 MIL/uL (ref 3.87–5.11)
RDW: 13.2 % (ref 11.5–15.5)
WBC: 9.3 10*3/uL (ref 4.0–10.5)

## 2012-08-06 LAB — TROPONIN I: Troponin I: <0.3 ng/mL (ref <0.30)

## 2012-08-06 MED ORDER — ALBUTEROL SULFATE (5 MG/ML) 0.5% IN NEBU
5.0000 mg | INHALATION_SOLUTION | Freq: Once | RESPIRATORY_TRACT | Status: AC
  Administered 2012-08-06: 5 mg via RESPIRATORY_TRACT
  Filled 2012-08-06: qty 1

## 2012-08-06 MED ORDER — IPRATROPIUM BROMIDE 0.02 % IN SOLN
0.5000 mg | Freq: Once | RESPIRATORY_TRACT | Status: AC
  Administered 2012-08-06: 0.5 mg via RESPIRATORY_TRACT
  Filled 2012-08-06: qty 2.5

## 2012-08-06 MED ORDER — ALBUTEROL SULFATE HFA 108 (90 BASE) MCG/ACT IN AERS
2.0000 | INHALATION_SPRAY | Freq: Once | RESPIRATORY_TRACT | Status: AC
  Administered 2012-08-06: 2 via RESPIRATORY_TRACT
  Filled 2012-08-06: qty 6.7

## 2012-08-06 MED ORDER — LORAZEPAM 1 MG PO TABS
1.0000 mg | ORAL_TABLET | Freq: Once | ORAL | Status: AC
  Administered 2012-08-06: 1 mg via ORAL
  Filled 2012-08-06: qty 1

## 2012-08-06 NOTE — Progress Notes (Signed)
Instructed patient on use of Albuterol MDI with spacer. She voiced her understanding.
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
## 2012-08-06 NOTE — ED Provider Notes (Signed)
History   This chart was scribed for Raeford Razor, MD by Melba Coon, ED Scribe. The patient was seen in room APA10/APA10 and the patient's care was started at 11:42AM.    CSN: [id number]  Arrival date & time 08/06/12  1109   None     Chief Complaint  Patient presents with  . Chest Pain    (Consider location/radiation/quality/duration/timing/severity/associated sxs/prior treatment) The history is provided by the patient. No language interpreter was used.   Katherine Burgess is a 32 y.o. female who presents to the Emergency Department complaining of constant, moderate, intermittent, sharp chest pain with associated nausea and emesis with an onset 3 days ago. She reports a lot of stress in her life and is currently homeless and staying at a shelter. She reports she does not want to speak to a Child psychotherapist at this time. She reports SOB with difficulty breathing. Sleeping with elevation alleviates the pain. Sleeping while laying flat aggravates the pain. She reports the present pain and SOB feels slightly different from past history of asthma. She reports lower extremity swelling since onset. Denies history of blood clots. PERC negative. Reports nasal congestion, fever and chills. Denies HA, fever, neck pain, sore throat, rash, back pain, abdominal pain, diarrhea, dysuria, or extremity pain, edema, weakness, numbness, or tingling. Denies IV drug use, but reports using marijuana in the past week. She reports she is depressed and has a history of anxiety and asthma. However, she has not been taking her medications; she reports she cannot find them. She states she gets her prescriptions from [facility]. No other pertinent medical symptoms.  Past Medical History  Diagnosis Date  . Depression   . Asthma   . Anxiety   . Bipolar 1 disorder   . PTSD (post-traumatic stress disorder)   . Renal disorder     Past Surgical History  Procedure Date  . Kidney stone surgery     Family History  Problem  Relation Age of Onset  . Thyroid disease Father   . Heart failure Father   . Diabetes Father   . Hypertension Father     History  Substance Use Topics  . Smoking status: Current Every Day Smoker -- 2.0 packs/day    Types: Cigarettes  . Smokeless tobacco: Not on file  . Alcohol Use: Yes     Comment: beer occasionally     OB History    Grav Para Term Preterm Abortions TAB SAB Ect Mult Living                  Review of Systems  Cardiovascular: Positive for chest pain.  10 Systems reviewed and all are negative for acute change except as noted in the HPI.   Allergies  Bactrim; Flagyl; Onion; and Peanuts  Home Medications   Current Outpatient Rx  Name  Route  Sig  Dispense  Refill  . ALPRAZOLAM 0.5 MG PO TABS   Oral   Take 0.5 mg by mouth 4 (four) times daily.         Marland Kitchen CITALOPRAM HYDROBROMIDE 40 MG PO TABS   Oral   Take 40 mg by mouth daily.         Marland Kitchen LITHIUM CARBONATE 300 MG PO TABS   Oral   Take 300 mg by mouth 3 (three) times daily.         . TRAZODONE HCL 100 MG PO TABS   Oral   Take 200 mg by mouth at bedtime.  BP 117/75  Pulse 65  Temp 98.3 F (36.8 C) (Oral)  Resp 18  Ht 5' (1.524 m)  Wt 162 lb (73.483 kg)  BMI 31.64 kg/m2  SpO2 97%  LMP 08/05/2012  Physical Exam  Nursing note and vitals reviewed. Constitutional: She appears well-developed and well-nourished. She appears distressed.       Tearful.  HENT:  Head: Normocephalic and atraumatic.  Eyes: Conjunctivae normal are normal. Right eye exhibits no discharge. Left eye exhibits no discharge.  Neck: Neck supple.  Cardiovascular: Normal rate, regular rhythm and normal heart sounds.  Exam reveals no gallop and no friction rub.   No murmur heard. Pulmonary/Chest: Effort normal. No respiratory distress. She has wheezes.       Thin expiratory wheezing.  Abdominal: Soft. She exhibits no distension. There is no tenderness.  Musculoskeletal: She exhibits no edema and no  tenderness.  Neurological: She is alert.  Skin: Skin is warm and dry.  Psychiatric: Her behavior is normal. Thought content normal. Her mood appears anxious.    ED Course  Procedures (including critical care time)  DIAGNOSTIC STUDIES: Oxygen Saturation is 100% on room air, normal by my interpretation.    COORDINATION OF CARE:  11:46AM - ativan, CXR, CBC, troponin, BMP, and breathing treatment will be ordered for Katherine Burgess.    12:45PM - labs reviewed  Labs Reviewed  CBC  BASIC METABOLIC PANEL  TROPONIN I   1:00PM - imaging reviewed and is unremarkable. She is ready for d/c. Dg Chest 2 View  08/06/2012  *RADIOLOGY REPORT*  Clinical Data: Cough and fever.  CHEST - 2 VIEW  Comparison: 06/13/2012.  Findings: The cardiac silhouette, mediastinal and hilar contours are normal.  The lungs are clear.  No pleural effusion.  The bony thorax is intact.  IMPRESSION: Normal chest x-ray.   Original Report Authenticated By: Rudie Meyer, M.D.    EKG:  Rhythm: normal sinus Rate: 67 Axis: normal Intervals: normal  ST segments: NS ST changes. There is some t wave flattening anterior precordial leads and inferiorly   1. Chest pain   2. Anxiety       MDM  31yf with CP. Atypical for ACS. ekg fairly unremarkable. Trop normal. Doubt PE, pneumonia, dissection. CXR clear. Suspect strong anxiety component. Feel safe for DC. Social work spoke with pt. Has resources.    I personally preformed the services scribed in my presence. The recorded information has been reviewed is accurate. Raeford Razor, MD.         Raeford Razor, MD 08/06/12 [phone]

## 2012-08-06 NOTE — ED Notes (Signed)
Discharge entered on wrong patient

## 2012-08-06 NOTE — ED Notes (Signed)
NVD, chest pain for 3 days, says she is homeless, sob
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
## 2012-08-06 NOTE — ED Notes (Signed)
Patient with no complaints at this time. Respirations even and unlabored. Skin warm/dry. Discharge instructions reviewed with patient at this time. Patient given opportunity to voice concerns/ask questions. IV removed per policy and band-aid applied to site. Patient discharged at this time and left Emergency Department with steady gait.  

## 2012-09-28 ENCOUNTER — Encounter (HOSPITAL_COMMUNITY): Payer: Self-pay

## 2012-09-28 ENCOUNTER — Emergency Department (HOSPITAL_COMMUNITY)
Admission: EM | Admit: 2012-09-28 | Discharge: 2012-09-28 | Disposition: A | Discharge status: Home Health | Payer: Medicaid Other | Attending: Emergency Medicine | Admitting: Emergency Medicine

## 2012-09-28 DIAGNOSIS — Z8659 Personal history of other mental and behavioral disorders: Secondary | ICD-10-CM | POA: Insufficient documentation

## 2012-09-28 DIAGNOSIS — Z87448 Personal history of other diseases of urinary system: Secondary | ICD-10-CM | POA: Insufficient documentation

## 2012-09-28 DIAGNOSIS — Z79899 Other long term (current) drug therapy: Secondary | ICD-10-CM | POA: Insufficient documentation

## 2012-09-28 DIAGNOSIS — F411 Generalized anxiety disorder: Secondary | ICD-10-CM | POA: Insufficient documentation

## 2012-09-28 DIAGNOSIS — Z87442 Personal history of urinary calculi: Secondary | ICD-10-CM | POA: Insufficient documentation

## 2012-09-28 DIAGNOSIS — F17200 Nicotine dependence, unspecified, uncomplicated: Secondary | ICD-10-CM | POA: Insufficient documentation

## 2012-09-28 DIAGNOSIS — F319 Bipolar disorder, unspecified: Secondary | ICD-10-CM | POA: Insufficient documentation

## 2012-09-28 DIAGNOSIS — N949 Unspecified condition associated with female genital organs and menstrual cycle: Secondary | ICD-10-CM | POA: Insufficient documentation

## 2012-09-28 DIAGNOSIS — Z3202 Encounter for pregnancy test, result negative: Secondary | ICD-10-CM | POA: Insufficient documentation

## 2012-09-28 DIAGNOSIS — J45909 Unspecified asthma, uncomplicated: Secondary | ICD-10-CM | POA: Insufficient documentation

## 2012-09-28 LAB — URINALYSIS, ROUTINE W REFLEX MICROSCOPIC
Bilirubin Urine: NEGATIVE
Leukocytes, UA: NEGATIVE
Nitrite: NEGATIVE
Specific Gravity, Urine: >1.03 — ABNORMAL HIGH (ref 1.005–1.030)
Urobilinogen, UA: 0.2 mg/dL (ref 0.0–1.0)
pH: 6 (ref 5.0–8.0)

## 2012-09-28 LAB — WET PREP, GENITAL

## 2012-09-28 LAB — POCT PREGNANCY, URINE: Preg Test, Ur: NEGATIVE

## 2012-09-28 MED ORDER — CEFTRIAXONE SODIUM 250 MG IJ SOLR
250.0000 mg | Freq: Once | INTRAMUSCULAR | Status: AC
  Administered 2012-09-28: 250 mg via INTRAMUSCULAR
  Filled 2012-09-28: qty 250

## 2012-09-28 MED ORDER — DOXYCYCLINE HYCLATE 100 MG PO CAPS: 100.0000 mg | ORAL_CAPSULE | Freq: Two times a day (BID) | ORAL | Status: DC

## 2012-09-28 MED ORDER — AZITHROMYCIN 1 G PO PACK
1.0000 g | PACK | Freq: Once | ORAL | Status: AC
  Administered 2012-09-28: 1 g via ORAL
  Filled 2012-09-28: qty 1

## 2012-09-28 MED ORDER — HYDROCODONE-ACETAMINOPHEN 5-325 MG PO TABS: 1.0000 | ORAL_TABLET | Freq: Four times a day (QID) | ORAL | Status: DC | PRN

## 2012-09-28 MED ORDER — OXYCODONE-ACETAMINOPHEN 5-325 MG PO TABS
1.0000 | ORAL_TABLET | Freq: Once | ORAL | Status: AC
  Administered 2012-09-28: 1 via ORAL
  Filled 2012-09-28: qty 1

## 2012-09-28 NOTE — ED Notes (Signed)
Pt c/o pelvic pain and painful intercourse for the past 4 or 5 days.  Denies any vaginal discharge.  Reports her boyfriend has been c/o burning when he urinates so pt concerned she may have an STD.

## 2012-09-28 NOTE — ED Provider Notes (Signed)
History     CSN: [id number]  Arrival date & time 09/28/12  1155   First MD Initiated Contact with Patient 09/28/12 1411      Chief Complaint  Patient presents with  . 74.5     (Consider location/radiation/quality/duration/timing/severity/associated sxs/prior treatment) Patient is a 32 y.o. female presenting with abdominal pain. The history is provided by the patient (pt complains of lower abd pain for a few days). No language interpreter was used.  Abdominal Pain Pain location:  Suprapubic Pain quality: aching and dull   Pain radiates to:  Does not radiate Pain severity:  Moderate Onset quality:  Sudden Timing:  Constant Progression:  Unchanged Chronicity:  New Context: not alcohol use   Associated symptoms: no chest pain, no cough, no diarrhea, no fatigue and no hematuria     Past Medical History  Diagnosis Date  . Depression   . Asthma   . Anxiety   . Bipolar 1 disorder   . PTSD (post-traumatic stress disorder)   . Renal disorder     Past Surgical History  Procedure Laterality Date  . Kidney stone surgery    . Leep      Family History  Problem Relation Age of Onset  . Thyroid disease Father   . Heart failure Father   . Diabetes Father   . Hypertension Father     History  Substance Use Topics  . Smoking status: Current Every Day Smoker -- 2.00 packs/day    Types: Cigarettes  . Smokeless tobacco: Not on file  . Alcohol Use: No    OB History   Grav Para Term Preterm Abortions TAB SAB Ect Mult Living                  Review of Systems  Constitutional: Negative for fatigue.  HENT: Negative for congestion, sinus pressure and ear discharge.   Eyes: Negative for discharge.  Respiratory: Negative for cough.   Cardiovascular: Negative for chest pain.  Gastrointestinal: Positive for abdominal pain. Negative for diarrhea.  Genitourinary: Negative for frequency and hematuria.  Musculoskeletal: Negative for back pain.  Skin: Negative for rash.   Neurological: Negative for seizures and headaches.  Psychiatric/Behavioral: Negative for hallucinations.    Allergies  Bactrim; Flagyl; Onion; and Peanuts  Home Medications   Current Outpatient Rx  Name  Route  Sig  Dispense  Refill  . albuterol (PROVENTIL HFA;VENTOLIN HFA) 108 (90 BASE) MCG/ACT inhaler   Inhalation   Inhale 2 puffs into the lungs every 6 (six) hours as needed for wheezing.         . citalopram (CELEXA) 40 MG tablet   Oral   Take 40 mg by mouth daily.         Marland Kitchen lithium 300 MG tablet   Oral   Take 300 mg by mouth 3 (three) times daily.         . traZODone (DESYREL) 100 MG tablet   Oral   Take 200 mg by mouth at bedtime.         . ALPRAZolam (XANAX) 0.5 MG tablet   Oral   Take 0.5 mg by mouth 4 (four) times daily.         Marland Kitchen doxycycline (VIBRAMYCIN) 100 MG capsule   Oral   Take 1 capsule (100 mg total) by mouth 2 (two) times daily. One po bid x 7 days   28 capsule   0   . HYDROcodone-acetaminophen (NORCO/VICODIN) 5-325 MG per tablet   Oral  Take 1 tablet by mouth every 6 (six) hours as needed for pain.   30 tablet   0     BP 133/52  Pulse 84  Temp(Src) 98.3 F (36.8 C) (Oral)  Resp 18  Ht 5' (1.524 m)  Wt 152 lb (68.947 kg)  BMI 29.69 kg/m2  SpO2 99%  LMP 09/11/2012  Physical Exam  Constitutional: She is oriented to person, place, and time. She appears well-developed.  HENT:  Head: Normocephalic and atraumatic.  Eyes: Conjunctivae and EOM are normal. No scleral icterus.  Neck: Neck supple. No thyromegaly present.  Cardiovascular: Normal rate and regular rhythm.  Exam reveals no gallop and no friction rub.   No murmur heard. Pulmonary/Chest: No stridor. She has no wheezes. She has no rales. She exhibits no tenderness.  Abdominal: She exhibits no distension. There is tenderness. There is no rebound.  Mild rlq and llq abd pain  Genitourinary:  Tender cervix and adenexal  Musculoskeletal: Normal range of motion. She exhibits  no edema.  Lymphadenopathy:    She has no cervical adenopathy.  Neurological: She is oriented to person, place, and time. Coordination normal.  Skin: No rash noted. No erythema.  Psychiatric: She has a normal mood and affect. Her behavior is normal.    ED Course  Procedures (including critical care time)  Labs Reviewed  GC/CHLAMYDIA PROBE AMP  WET PREP, GENITAL  URINALYSIS, ROUTINE W REFLEX MICROSCOPIC  POCT PREGNANCY, URINE   No results found.   1. Pelvic pain       MDM  PID        Benny Lennert, MD 09/28/12 [id number]

## 2012-09-29 LAB — GC/CHLAMYDIA PROBE AMP: CT Probe RNA: NEGATIVE

## 2012-10-12 ENCOUNTER — Ambulatory Visit: Payer: Self-pay | Admitting: Obstetrics & Gynecology

## 2012-10-12 ENCOUNTER — Encounter: Payer: Self-pay | Admitting: *Deleted

## 2012-10-26 ENCOUNTER — Emergency Department (HOSPITAL_COMMUNITY): Payer: Medicaid Other

## 2012-10-26 ENCOUNTER — Encounter (HOSPITAL_COMMUNITY): Payer: Self-pay | Admitting: *Deleted

## 2012-10-26 ENCOUNTER — Emergency Department (HOSPITAL_COMMUNITY)
Admission: EM | Admit: 2012-10-26 | Discharge: 2012-10-26 | Disposition: A | Discharge status: Home / Self Care | Payer: Medicaid Other | Attending: Emergency Medicine | Admitting: Emergency Medicine

## 2012-10-26 DIAGNOSIS — R51 Headache: Secondary | ICD-10-CM | POA: Insufficient documentation

## 2012-10-26 DIAGNOSIS — M542 Cervicalgia: Secondary | ICD-10-CM | POA: Insufficient documentation

## 2012-10-26 DIAGNOSIS — F319 Bipolar disorder, unspecified: Secondary | ICD-10-CM | POA: Insufficient documentation

## 2012-10-26 DIAGNOSIS — R21 Rash and other nonspecific skin eruption: Secondary | ICD-10-CM | POA: Insufficient documentation

## 2012-10-26 DIAGNOSIS — R059 Cough, unspecified: Secondary | ICD-10-CM | POA: Insufficient documentation

## 2012-10-26 DIAGNOSIS — H539 Unspecified visual disturbance: Secondary | ICD-10-CM | POA: Insufficient documentation

## 2012-10-26 DIAGNOSIS — R112 Nausea with vomiting, unspecified: Secondary | ICD-10-CM | POA: Insufficient documentation

## 2012-10-26 DIAGNOSIS — R300 Dysuria: Secondary | ICD-10-CM | POA: Insufficient documentation

## 2012-10-26 DIAGNOSIS — R109 Unspecified abdominal pain: Secondary | ICD-10-CM | POA: Insufficient documentation

## 2012-10-26 DIAGNOSIS — J45909 Unspecified asthma, uncomplicated: Secondary | ICD-10-CM | POA: Insufficient documentation

## 2012-10-26 DIAGNOSIS — Z87448 Personal history of other diseases of urinary system: Secondary | ICD-10-CM | POA: Insufficient documentation

## 2012-10-26 DIAGNOSIS — R141 Gas pain: Secondary | ICD-10-CM | POA: Insufficient documentation

## 2012-10-26 DIAGNOSIS — N730 Acute parametritis and pelvic cellulitis: Secondary | ICD-10-CM

## 2012-10-26 DIAGNOSIS — N739 Female pelvic inflammatory disease, unspecified: Secondary | ICD-10-CM | POA: Insufficient documentation

## 2012-10-26 DIAGNOSIS — R142 Eructation: Secondary | ICD-10-CM | POA: Insufficient documentation

## 2012-10-26 DIAGNOSIS — F411 Generalized anxiety disorder: Secondary | ICD-10-CM | POA: Insufficient documentation

## 2012-10-26 DIAGNOSIS — Z8659 Personal history of other mental and behavioral disorders: Secondary | ICD-10-CM | POA: Insufficient documentation

## 2012-10-26 DIAGNOSIS — F17200 Nicotine dependence, unspecified, uncomplicated: Secondary | ICD-10-CM | POA: Insufficient documentation

## 2012-10-26 DIAGNOSIS — N9489 Other specified conditions associated with female genital organs and menstrual cycle: Secondary | ICD-10-CM | POA: Insufficient documentation

## 2012-10-26 DIAGNOSIS — R0602 Shortness of breath: Secondary | ICD-10-CM | POA: Insufficient documentation

## 2012-10-26 DIAGNOSIS — J3489 Other specified disorders of nose and nasal sinuses: Secondary | ICD-10-CM | POA: Insufficient documentation

## 2012-10-26 DIAGNOSIS — Z79899 Other long term (current) drug therapy: Secondary | ICD-10-CM | POA: Insufficient documentation

## 2012-10-26 DIAGNOSIS — M7989 Other specified soft tissue disorders: Secondary | ICD-10-CM | POA: Insufficient documentation

## 2012-10-26 DIAGNOSIS — R05 Cough: Secondary | ICD-10-CM | POA: Insufficient documentation

## 2012-10-26 DIAGNOSIS — Z3202 Encounter for pregnancy test, result negative: Secondary | ICD-10-CM | POA: Insufficient documentation

## 2012-10-26 DIAGNOSIS — N898 Other specified noninflammatory disorders of vagina: Secondary | ICD-10-CM | POA: Insufficient documentation

## 2012-10-26 LAB — URINALYSIS, ROUTINE W REFLEX MICROSCOPIC
Hgb urine dipstick: NEGATIVE
Specific Gravity, Urine: 1.025 (ref 1.005–1.030)
Urobilinogen, UA: 0.2 mg/dL (ref 0.0–1.0)
pH: 7 (ref 5.0–8.0)

## 2012-10-26 LAB — CBC WITH DIFFERENTIAL/PLATELET
Eosinophils Absolute: 0 10*3/uL (ref 0.0–0.7)
Eosinophils Relative: 0 % (ref 0–5)
Hemoglobin: 13.9 g/dL (ref 12.0–15.0)
Lymphs Abs: 2.5 10*3/uL (ref 0.7–4.0)
MCH: 32 pg (ref 26.0–34.0)
MCV: 93.5 fL (ref 78.0–100.0)
Monocytes Relative: 7 % (ref 3–12)
RBC: 4.34 MIL/uL (ref 3.87–5.11)

## 2012-10-26 LAB — WET PREP, GENITAL
Trich, Wet Prep: NONE SEEN
Yeast Wet Prep HPF POC: NONE SEEN

## 2012-10-26 LAB — COMPREHENSIVE METABOLIC PANEL
BUN: 12 mg/dL (ref 6–23)
Calcium: 9.5 mg/dL (ref 8.4–10.5)
Creatinine, Ser: 0.87 mg/dL (ref 0.50–1.10)
GFR calc Af Amer: >90 mL/min (ref >90)
Glucose, Bld: 85 mg/dL (ref 70–99)
Total Protein: 7.6 g/dL (ref 6.0–8.3)

## 2012-10-26 LAB — LIPASE, BLOOD: Lipase: 40 U/L (ref 11–59)

## 2012-10-26 MED ORDER — SODIUM CHLORIDE 0.9 % IV BOLUS (SEPSIS)
250.0000 mL | Freq: Once | INTRAVENOUS | Status: AC
  Administered 2012-10-26: 250 mL via INTRAVENOUS

## 2012-10-26 MED ORDER — IOHEXOL 300 MG/ML  SOLN
100.0000 mL | Freq: Once | INTRAMUSCULAR | Status: AC | PRN
  Administered 2012-10-26: 100 mL via INTRAVENOUS

## 2012-10-26 MED ORDER — SODIUM CHLORIDE 0.9 % IV SOLN
INTRAVENOUS | Status: DC
  Administered 2012-10-26: 18:00:00 via INTRAVENOUS

## 2012-10-26 MED ORDER — CEFTRIAXONE SODIUM 250 MG IJ SOLR
250.0000 mg | Freq: Once | INTRAMUSCULAR | Status: DC
  Filled 2012-10-26: qty 250

## 2012-10-26 MED ORDER — IOHEXOL 300 MG/ML  SOLN
50.0000 mL | Freq: Once | INTRAMUSCULAR | Status: AC | PRN
  Administered 2012-10-26: 50 mL via ORAL

## 2012-10-26 MED ORDER — HYDROMORPHONE HCL PF 1 MG/ML IJ SOLN
1.0000 mg | Freq: Once | INTRAMUSCULAR | Status: AC
  Administered 2012-10-26: 1 mg via INTRAVENOUS
  Filled 2012-10-26: qty 1

## 2012-10-26 MED ORDER — AZITHROMYCIN 250 MG PO TABS
1000.0000 mg | ORAL_TABLET | Freq: Once | ORAL | Status: AC
  Administered 2012-10-26: 1000 mg via ORAL
  Filled 2012-10-26: qty 4

## 2012-10-26 MED ORDER — DOXYCYCLINE HYCLATE 100 MG PO CAPS: 100.0000 mg | ORAL_CAPSULE | Freq: Two times a day (BID) | ORAL | Status: DC

## 2012-10-26 MED ORDER — ONDANSETRON HCL 4 MG/2ML IJ SOLN
4.0000 mg | Freq: Once | INTRAMUSCULAR | Status: AC
  Administered 2012-10-26: 4 mg via INTRAVENOUS
  Filled 2012-10-26: qty 2

## 2012-10-26 MED ORDER — CEFTRIAXONE SODIUM 250 MG IJ SOLR
250.0000 mg | Freq: Once | INTRAMUSCULAR | Status: AC
  Administered 2012-10-26: 250 mg via INTRAMUSCULAR

## 2012-10-26 MED ORDER — HYDROCODONE-ACETAMINOPHEN 5-325 MG PO TABS: 1.0000 | ORAL_TABLET | Freq: Four times a day (QID) | ORAL | Status: DC | PRN

## 2012-10-26 NOTE — ED Notes (Signed)
Patient is resting comfortably. 

## 2012-10-26 NOTE — Discharge Instructions (Signed)
Take antibiotics as directed. Followup with OB GYN in the next few days. Referral information provided general referral information provided as well. Her once again treating you for a pelvic inflammatory disease. Cultures are still pending. As we discussed the pelvic pain may not always be related to infection. Return for new or worse symptoms.  RESOURCE GUIDE  Chronic Pain Problems: Contact [facility]  [phone] Patients need to be referred by their primary care doctor.  Insufficient Money for Medicine: Contact United Way:  call "211."   No Primary Care Doctor: - Call Health Connect  [phone] - can help you locate a primary care doctor that  accepts your insurance, provides certain services, etc. - Physician Referral Service- [phone]  Agencies that provide inexpensive medical care: - [facility]  [phone] - [facility]  [phone] - [facility]  [phone] - Women's Clinic  [phone] - Planned Parenthood  [phone] [facility]  [phone]  Medicaid-accepting [facility] Providers: - [facility]- [address]  [phone], Mon-Fri 9am-7pm, Sat 9am-1pm - [facility]- [address] [place] [place], Suite [place]  [phone] - [facility]- [address], Suite [place]  [phone] [facility]- [address]  [phone] - Renaye Rakers- [address] [place], [address], [phone]  Only accepts [place] patients after they have their name  applied to their card  Self Pay (no insurance) in [place]: - Sickle Cell Patients: Dr Willey Blade, [facility] Internal Medicine  [address] [place], [phone] - [facility]- [address] [place]  [phone]       [facility] [place]- [address]       -     [facility]- see information above (Speak to [employer] if you do not  have insurance)       -  [facility]- [address],  [phone]       -  [facility]- [address] [phone]       -  Dr Julio Sicks-  [address], [place], [phone]       -  [facility] - [address], [phone]       -  [facility]- [address], [phone], also [address] [phone]       -    [facility]- [address] [place], [phone], 1st & 3rd Saturday        every month, 10am-1pm  [facility] [address] [place], [place] [address] [phone]  [facility] [address] [place], [place] [address] [phone]  1) Find a Doctor and Pay Out of Pocket Although you won't have to find out who is covered by your insurance plan, it is a good idea to ask around and get recommendations. You will then need to call the office and see if the doctor you have chosen will accept you as a new patient and what types of options they offer for patients who are self-pay. Some doctors offer discounts or will set up payment plans for their patients who do not have insurance, but you will need to ask so you aren't surprised when you get to your appointment.  2) Contact Your Local Health Department Not all health departments have doctors that can see patients for sick visits, but many do, so it is worth a call to see if yours does. If you don't know where your local health department is, you can check in your phone book. The CDC also has a tool to help you locate your state's health department, and many state websites also have listings of all of their local health departments.  3) Find a Walk-in Clinic If your illness is not likely to be very severe or complicated, you may want to try a walk in clinic. These are popping up all over the country in pharmacies, drugstores, and shopping centers. They're usually staffed by nurse practitioners or physician  assistants that have been trained to treat common illnesses and complaints. They're usually fairly quick and inexpensive. However, if you have serious medical issues or chronic medical problems, these are probably not your best option  STD Testing - [facility] Department of [facility] [place], STD Clinic, [address] [place] [address] [place], phone [phone] or [phone].  Monday - Friday, call for an appointment. [facility] Department of [employer], STD Clinic, [place] [address], [place], phone [phone] or [phone].  Monday - Friday, call for an appointment.  Abuse/Neglect: [facility] Child Abuse Hotline (360)657-8558 [facility] Child Abuse Hotline 773 147 6743 (After Hours)  Emergency Shelter:  [facility] 915-717-1728  Maternity Homes: - [facility] at [facility] [facility] [phone] - [facility] [phone]) [phone]  MRSA Hotline #:   513-400-2298  Dental Assistance If unable to pay or uninsured, contact:  [facility]. to become qualified for the adult dental clinic.  Patients with Medicaid: [facility] [phone] [address] Joellyn Quails, [phone] [address], [phone]  If unable to pay, or uninsured, contact [facility] [phone] in [place], [phone] in [facility]) to become qualified for the adult dental clinic  [facility] [address] [place], [place] [address] [phone] [email or web address]  Other Proofreader Services: - [facility]- [address] [place], [place], [place], [address], [phone], Ext. 123, 2nd and 4th Thursday of the month at 6:30am.  10 clients each day by appointment, can sometimes see walk-in patients if someone does not show for an appointment. [facility]- [address] [place], [place], [address], [phone] - [facility] [address] [place], [place],  [address], [phone] - [facility]- [phone] [facility] Health Department- [phone] [facility] Health [phone]       Behavioral Health Resources in the [facility]  Intensive Outpatient Programs: [facility]      [address] [place], [place] [phone] Both a day and evening program       [facility] Outpatient     [address]        [place], [place] [address] [phone]         ADS: [facility] [address] [place] [phone]  [facility] Mental [facility] ACCESS LINE: (201) [phone] or [phone] [address] [place], [place] [address] [email or web address]   Substance Abuse Resources: - [facility] Drug [facility]  208-806-8676 - [facility] (435) [phone] - [facility] [place] (941)[phone] Floydene Flock 2708524365 - Residential & Outpatient Substance Abuse Program  [phone]  Psychological Services: - [facility] Behavioral Health  [phone] [facility]  ([phone] - [facility], [phone] [address] [place], ACCESS LINE: 8154333605 or [phone], [email or web address]  Mobile Crisis Teams:                                        Therapeutic Alternatives         Mobile Crisis Care Unit [phone]             [facility] [address] Ginette Otto [phone]                                         Interventionist [address] [place] [place] [phone]  Self-Help/Support Groups: [facility] Health Assoc[facility] of [employer] of support groups [phone] (call for more info)   Narcotics Anonymous (NA) Caring Services [address] [place] - 2 meetings at this location  Residential Treatment Programs:  [facility]      [address]        [place]  [place]       [phone]         [facility] [address] [place] [address] [place], [place]  [address] [phone]  [facility] Treatment Facility  [address] [place], [place] [address] [phone] Admissions: 8am-3pm M-F  [facility]     [address]        [place], [place] [address]       [phone]         [facility] [address] [place], [place] [phone]  [facility] [address] [place], [place] [phone]  [facility] - Intensive Outpatient      [address]     [place], [place]       [phone]         [facility] (Addiction Recovery Care [facility]     [address] [place], [place] [phone] or [phone]  Residential Treatment Services (RTS), Medicaid [address] [place], [place] [phone]  Fellowship [address]                                               [address] [place] [phone]  [facility] Resources: [phone]               General Therapy                                                Angie Fava, PhD        [address] [place], [place] [address]         [phone]   Insurance  [facility] Behavioral   [address] [place], [place] [address] [phone]  [facility] [address] [place], [place] [address] [phone] Insurance/Medicaid/sponsorship through [facility]                                              [address]                                        [place], [place] [address]    Therapy/tele-psych/case         [phone]          [facility] [address], [place]  [address]  Adolescent/group home/case management [phone]                                           Creola Corn PhD       General therapy       Insurance   [phone]         Dr. Lolly Mustache, [place], M-F [phone]  [facility]  [employer] [facility] Dept. [address]                 [address]         [address] [place] [address]  Blondell Reveal Phone:  [phone]                                  Phone:  [phone]                   Phone:  [phone]  [facility] Mental Health, [phone] - [facility] - [facility]- [phone]       -     [facility] in [place], [address]             [phone], Insurance  [place] Child Abuse Hotline 7544522844 or [phone] (After Hours)
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
## 2012-10-26 NOTE — ED Notes (Signed)
Vaginal pain with bloody discharge

## 2012-10-27 LAB — GC/CHLAMYDIA PROBE AMP
CT Probe RNA: NEGATIVE
GC Probe RNA: NEGATIVE

## 2012-10-28 LAB — URINE CULTURE: Colony Count: 25000

## 2012-10-29 ENCOUNTER — Telehealth (HOSPITAL_COMMUNITY): Payer: Self-pay | Admitting: *Deleted

## 2012-10-29 NOTE — ED Notes (Signed)
Pt has + urine culture, treated with doxycycline, no sensitivity report available.  Chart sent to EDP for review

## 2012-11-02 ENCOUNTER — Telehealth (HOSPITAL_COMMUNITY): Payer: Self-pay | Admitting: Emergency Medicine

## 2012-11-02 NOTE — ED Notes (Signed)
(+)   [facility] Chart reviewed by Jaynie Crumble PA "Keflex 500 mg tab, #14 1 tab po BID x 7 d"  Pt informed and Rx called to [facility] [phone] and left on VM.

## 2012-11-03 ENCOUNTER — Emergency Department (HOSPITAL_COMMUNITY)
Admission: EM | Admit: 2012-11-03 | Discharge: 2012-11-03 | Disposition: A | Discharge status: Home / Self Care | Payer: Medicaid Other | Attending: Emergency Medicine | Admitting: Emergency Medicine

## 2012-11-03 ENCOUNTER — Encounter (HOSPITAL_COMMUNITY): Payer: Self-pay

## 2012-11-03 DIAGNOSIS — F17200 Nicotine dependence, unspecified, uncomplicated: Secondary | ICD-10-CM | POA: Insufficient documentation

## 2012-11-03 DIAGNOSIS — F319 Bipolar disorder, unspecified: Secondary | ICD-10-CM | POA: Insufficient documentation

## 2012-11-03 DIAGNOSIS — R6883 Chills (without fever): Secondary | ICD-10-CM | POA: Insufficient documentation

## 2012-11-03 DIAGNOSIS — R197 Diarrhea, unspecified: Secondary | ICD-10-CM | POA: Insufficient documentation

## 2012-11-03 DIAGNOSIS — R1084 Generalized abdominal pain: Secondary | ICD-10-CM | POA: Insufficient documentation

## 2012-11-03 DIAGNOSIS — R52 Pain, unspecified: Secondary | ICD-10-CM | POA: Insufficient documentation

## 2012-11-03 DIAGNOSIS — IMO0001 Reserved for inherently not codable concepts without codable children: Secondary | ICD-10-CM | POA: Insufficient documentation

## 2012-11-03 DIAGNOSIS — Z87448 Personal history of other diseases of urinary system: Secondary | ICD-10-CM | POA: Insufficient documentation

## 2012-11-03 DIAGNOSIS — Z8659 Personal history of other mental and behavioral disorders: Secondary | ICD-10-CM | POA: Insufficient documentation

## 2012-11-03 DIAGNOSIS — Z79899 Other long term (current) drug therapy: Secondary | ICD-10-CM | POA: Insufficient documentation

## 2012-11-03 DIAGNOSIS — R059 Cough, unspecified: Secondary | ICD-10-CM | POA: Insufficient documentation

## 2012-11-03 DIAGNOSIS — R55 Syncope and collapse: Secondary | ICD-10-CM | POA: Insufficient documentation

## 2012-11-03 DIAGNOSIS — R0609 Other forms of dyspnea: Secondary | ICD-10-CM | POA: Insufficient documentation

## 2012-11-03 DIAGNOSIS — F411 Generalized anxiety disorder: Secondary | ICD-10-CM | POA: Insufficient documentation

## 2012-11-03 DIAGNOSIS — K529 Noninfective gastroenteritis and colitis, unspecified: Secondary | ICD-10-CM

## 2012-11-03 DIAGNOSIS — Z3202 Encounter for pregnancy test, result negative: Secondary | ICD-10-CM | POA: Insufficient documentation

## 2012-11-03 DIAGNOSIS — R05 Cough: Secondary | ICD-10-CM | POA: Insufficient documentation

## 2012-11-03 DIAGNOSIS — K5289 Other specified noninfective gastroenteritis and colitis: Secondary | ICD-10-CM | POA: Insufficient documentation

## 2012-11-03 DIAGNOSIS — R0602 Shortness of breath: Secondary | ICD-10-CM | POA: Insufficient documentation

## 2012-11-03 DIAGNOSIS — R0789 Other chest pain: Secondary | ICD-10-CM | POA: Insufficient documentation

## 2012-11-03 DIAGNOSIS — R0989 Other specified symptoms and signs involving the circulatory and respiratory systems: Secondary | ICD-10-CM | POA: Insufficient documentation

## 2012-11-03 DIAGNOSIS — J45909 Unspecified asthma, uncomplicated: Secondary | ICD-10-CM | POA: Insufficient documentation

## 2012-11-03 LAB — BASIC METABOLIC PANEL
BUN: 8 mg/dL (ref 6–23)
CO2: 22 mEq/L (ref 19–32)
Chloride: 103 mEq/L (ref 96–112)
Creatinine, Ser: 0.73 mg/dL (ref 0.50–1.10)

## 2012-11-03 LAB — URINALYSIS, ROUTINE W REFLEX MICROSCOPIC
Bilirubin Urine: NEGATIVE
Ketones, ur: NEGATIVE mg/dL
Nitrite: NEGATIVE
pH: 6 (ref 5.0–8.0)

## 2012-11-03 LAB — CBC WITH DIFFERENTIAL/PLATELET
Basophils Absolute: 0 10*3/uL (ref 0.0–0.1)
Eosinophils Relative: 0 % (ref 0–5)
HCT: 40.3 % (ref 36.0–46.0)
Lymphocytes Relative: 41 % (ref 12–46)
MCHC: 35 g/dL (ref 30.0–36.0)
MCV: 93.3 fL (ref 78.0–100.0)
Monocytes Absolute: 0.3 10*3/uL (ref 0.1–1.0)
RDW: 13.5 % (ref 11.5–15.5)
WBC: 6.9 10*3/uL (ref 4.0–10.5)

## 2012-11-03 MED ORDER — SODIUM CHLORIDE 0.9 % IV SOLN: 1000.0000 mL | INTRAVENOUS | Status: DC

## 2012-11-03 MED ORDER — ONDANSETRON HCL 4 MG/2ML IJ SOLN
4.0000 mg | Freq: Once | INTRAMUSCULAR | Status: AC
  Administered 2012-11-03: 4 mg via INTRAVENOUS
  Filled 2012-11-03: qty 2

## 2012-11-03 MED ORDER — ONDANSETRON HCL 4 MG PO TABS: 4.0000 mg | ORAL_TABLET | Freq: Four times a day (QID) | ORAL | Status: DC | PRN

## 2012-11-03 MED ORDER — SODIUM CHLORIDE 0.9 % IV SOLN
1000.0000 mL | Freq: Once | INTRAVENOUS | Status: AC
  Administered 2012-11-03: 1000 mL via INTRAVENOUS

## 2012-11-03 NOTE — ED Notes (Signed)
Family states that patient had 2 syncopal episodes at home today. Patient states that her chest is hurting from the cough, has been having trouble breathing since this morning. Patient states that she has been vomiting all day long.

## 2012-11-03 NOTE — ED Provider Notes (Signed)
History  This chart was scribed for Dione Booze, MD by Shari Heritage, ED Scribe. The patient was seen in room APA15/APA15. Patient's care was started at 2052.  CSN: [id number]  Arrival date & time 11/03/12  Mikle Bosworth   First MD Initiated Contact with Patient 11/03/12 2052      Chief Complaint  Patient presents with  . Emesis  . Near Syncope  . Cough  . Palpitations    The history is provided by the patient. No language interpreter was used.    HPI Comments: Katherine Burgess is a 32 y.o. female with a history of asthma, bipolar 1 disorder, depression and renal disorder who presents to the Emergency Department complaining of persistent vomiting and diarrhea since this morning. There is associated chills and body aches. Patient is also complaining of a cough and difficulty breathing with associated chest discomfort since also began this morning. She describes chest pain as "heaviness." Patient also reports that she had two syncopal episodes today. Patient denies sore throat, difficulty urinating, rash, headache or any other symptoms at this time. She is a current every day smoker. She does not use alcohol.   Past Medical History  Diagnosis Date  . Depression   . Asthma   . Anxiety   . Bipolar 1 disorder   . PTSD (post-traumatic stress disorder)   . Renal disorder     Past Surgical History  Procedure Laterality Date  . Kidney stone surgery    . Leep      Family History  Problem Relation Age of Onset  . Thyroid disease Father   . Heart failure Father   . Diabetes Father   . Hypertension Father     History  Substance Use Topics  . Smoking status: Current Every Day Smoker -- 2.00 packs/day    Types: Cigarettes  . Smokeless tobacco: Not on file  . Alcohol Use: No    OB History   Grav Para Term Preterm Abortions TAB SAB Ect Mult Living                  Review of Systems  Constitutional: Positive for chills. Negative for fever.  Respiratory: Positive for cough and  shortness of breath.   Gastrointestinal: Positive for nausea, vomiting and diarrhea.  Musculoskeletal: Positive for myalgias.  Skin: Negative for rash.  Neurological: Positive for syncope.  All other systems reviewed and are negative.    Allergies  Bactrim; Flagyl; Onion; and Peanuts  Home Medications   Current Outpatient Rx  Name  Route  Sig  Dispense  Refill  . albuterol (PROVENTIL HFA;VENTOLIN HFA) 108 (90 BASE) MCG/ACT inhaler   Inhalation   Inhale 2 puffs into the lungs every 6 (six) hours as needed for wheezing.         Marland Kitchen ALPRAZolam (XANAX) 0.5 MG tablet   Oral   Take 0.5 mg by mouth 4 (four) times daily.         . citalopram (CELEXA) 40 MG tablet   Oral   Take 40 mg by mouth daily.         Marland Kitchen lithium 300 MG tablet   Oral   Take 300 mg by mouth 3 (three) times daily.         . traZODone (DESYREL) 100 MG tablet   Oral   Take 200 mg by mouth at bedtime.           Triage Vitals: BP 122/74  Pulse 69  Temp(Src) 98.7 F (37.1 C) (  Oral)  Resp 20  Ht 5' (1.524 m)  Wt 152 lb (68.947 kg)  BMI 29.69 kg/m2  SpO2 100%  LMP 10/20/2012  Physical Exam  Constitutional: She is oriented to person, place, and time. She appears well-developed and well-nourished.  HENT:  Head: Normocephalic and atraumatic.  Eyes: Conjunctivae and EOM are normal. Pupils are equal, round, and reactive to light.  Neck: Normal range of motion. Neck supple.  Cardiovascular: Normal rate, regular rhythm and normal heart sounds.   Pulmonary/Chest: Effort normal and breath sounds normal. No respiratory distress.  Abdominal: Soft. Bowel sounds are decreased. There is tenderness. There is no rebound and no guarding.  Mild tenderness diffusely.  Neurological: She is alert and oriented to person, place, and time.  Skin: Skin is warm and dry.    ED Course  Procedures (including critical care time) DIAGNOSTIC STUDIES: Oxygen Saturation is 100% on room air, normal by my interpretation.     COORDINATION OF CARE: 9:05 PM- Patient informed of current plan for treatment and evaluation and agrees with plan at this time.   Results for orders placed during the hospital encounter of 11/03/12  BASIC METABOLIC PANEL      Result Value Range   Sodium 137  135 - 145 mEq/L   Potassium 3.6  3.5 - 5.1 mEq/L   Chloride 103  96 - 112 mEq/L   CO2 22  19 - 32 mEq/L   Glucose, Bld 93  70 - 99 mg/dL   BUN 8  6 - 23 mg/dL   Creatinine, Ser 3.66  0.50 - 1.10 mg/dL   Calcium 9.6  8.4 - 44.0 mg/dL   GFR calc non Af Amer >90  >90 mL/min   GFR calc Af Amer >90  >90 mL/min  CBC WITH DIFFERENTIAL      Result Value Range   WBC 6.9  4.0 - 10.5 K/uL   RBC 4.32  3.87 - 5.11 MIL/uL   Hemoglobin 14.1  12.0 - 15.0 g/dL   HCT 34.7  42.5 - 95.6 %   MCV 93.3  78.0 - 100.0 fL   MCH 32.6  26.0 - 34.0 pg   MCHC 35.0  30.0 - 36.0 g/dL   RDW 38.7  56.4 - 33.2 %   Platelets 344  150 - 400 K/uL   Neutrophils Relative 54  43 - 77 %   Neutro Abs 3.7  1.7 - 7.7 K/uL   Lymphocytes Relative 41  12 - 46 %   Lymphs Abs 2.8  0.7 - 4.0 K/uL   Monocytes Relative 5  3 - 12 %   Monocytes Absolute 0.3  0.1 - 1.0 K/uL   Eosinophils Relative 0  0 - 5 %   Eosinophils Absolute 0.0  0.0 - 0.7 K/uL   Basophils Relative 0  0 - 1 %   Basophils Absolute 0.0  0.0 - 0.1 K/uL  URINALYSIS, ROUTINE W REFLEX MICROSCOPIC      Result Value Range   Color, Urine YELLOW  YELLOW   APPearance CLEAR  CLEAR   Specific Gravity, Urine 1.025  1.005 - 1.030   pH 6.0  5.0 - 8.0   Glucose, UA NEGATIVE  NEGATIVE mg/dL   Hgb urine dipstick NEGATIVE  NEGATIVE   Bilirubin Urine NEGATIVE  NEGATIVE   Ketones, ur NEGATIVE  NEGATIVE mg/dL   Protein, ur NEGATIVE  NEGATIVE mg/dL   Urobilinogen, UA 0.2  0.0 - 1.0 mg/dL   Nitrite NEGATIVE  NEGATIVE   Leukocytes, UA  NEGATIVE  NEGATIVE  POCT PREGNANCY, URINE      Result Value Range   Preg Test, Ur NEGATIVE  NEGATIVE    1. Gastroenteritis       MDM  Nausea, vomiting, diarrhea most  consistent with a viral gastroenteritis. Syncope seems most likely related to vomiting and diarrhea. She'll be given IV fluids and IV ondansetron and reevaluated. She states that she does not feel like she is going to have more diarrhea, so loperamide is not given.  She feels much better after above noted treatment. Laboratory workup is unremarkable. She is discharged with prescription for ondansetron and advised to use over-the-counter loperamide as needed for diarrhea.   I personally performed the services described in this documentation, which was scribed in my presence. The recorded information has been reviewed and is accurate.     Dione Booze, MD 11/03/12 [id number]

## 2012-11-03 NOTE — ED Notes (Signed)
Patient states that she feels like she is having palpitations and that this happened before she passed out.

## 2012-11-28 ENCOUNTER — Encounter (HOSPITAL_COMMUNITY): Payer: Self-pay | Admitting: Emergency Medicine

## 2012-11-28 ENCOUNTER — Emergency Department (HOSPITAL_COMMUNITY): Payer: Medicaid Other

## 2012-11-28 ENCOUNTER — Emergency Department (HOSPITAL_COMMUNITY)
Admission: EM | Admit: 2012-11-28 | Discharge: 2012-11-28 | Disposition: A | Discharge status: Home / Self Care | Payer: Medicaid Other | Attending: Emergency Medicine | Admitting: Emergency Medicine

## 2012-11-28 DIAGNOSIS — F319 Bipolar disorder, unspecified: Secondary | ICD-10-CM | POA: Insufficient documentation

## 2012-11-28 DIAGNOSIS — R3915 Urgency of urination: Secondary | ICD-10-CM | POA: Insufficient documentation

## 2012-11-28 DIAGNOSIS — Z8659 Personal history of other mental and behavioral disorders: Secondary | ICD-10-CM | POA: Insufficient documentation

## 2012-11-28 DIAGNOSIS — Z79899 Other long term (current) drug therapy: Secondary | ICD-10-CM | POA: Insufficient documentation

## 2012-11-28 DIAGNOSIS — S301XXA Contusion of abdominal wall, initial encounter: Secondary | ICD-10-CM | POA: Insufficient documentation

## 2012-11-28 DIAGNOSIS — F17200 Nicotine dependence, unspecified, uncomplicated: Secondary | ICD-10-CM | POA: Insufficient documentation

## 2012-11-28 DIAGNOSIS — F411 Generalized anxiety disorder: Secondary | ICD-10-CM | POA: Insufficient documentation

## 2012-11-28 DIAGNOSIS — J45909 Unspecified asthma, uncomplicated: Secondary | ICD-10-CM | POA: Insufficient documentation

## 2012-11-28 DIAGNOSIS — Z3202 Encounter for pregnancy test, result negative: Secondary | ICD-10-CM | POA: Insufficient documentation

## 2012-11-28 LAB — CBC WITH DIFFERENTIAL/PLATELET
Eosinophils Relative: 2 % (ref 0–5)
HCT: 38.4 % (ref 36.0–46.0)
Hemoglobin: 12.8 g/dL (ref 12.0–15.0)
Lymphocytes Relative: 45 % (ref 12–46)
Lymphs Abs: 3.5 10*3/uL (ref 0.7–4.0)
MCV: 97.2 fL (ref 78.0–100.0)
Monocytes Absolute: 0.5 10*3/uL (ref 0.1–1.0)
Monocytes Relative: 6 % (ref 3–12)
RBC: 3.95 MIL/uL (ref 3.87–5.11)
WBC: 7.7 10*3/uL (ref 4.0–10.5)

## 2012-11-28 LAB — COMPREHENSIVE METABOLIC PANEL
CO2: 27 mEq/L (ref 19–32)
Calcium: 9.4 mg/dL (ref 8.4–10.5)
Chloride: 106 mEq/L (ref 96–112)
Creatinine, Ser: 0.82 mg/dL (ref 0.50–1.10)
GFR calc Af Amer: >90 mL/min (ref >90)
GFR calc non Af Amer: >90 mL/min (ref >90)
Glucose, Bld: 83 mg/dL (ref 70–99)
Total Bilirubin: 0.1 mg/dL — ABNORMAL LOW (ref 0.3–1.2)

## 2012-11-28 LAB — URINALYSIS, ROUTINE W REFLEX MICROSCOPIC
Glucose, UA: NEGATIVE mg/dL
Protein, ur: NEGATIVE mg/dL
Specific Gravity, Urine: >1.03 — ABNORMAL HIGH (ref 1.005–1.030)
pH: 6 (ref 5.0–8.0)

## 2012-11-28 LAB — URINE MICROSCOPIC-ADD ON

## 2012-11-28 MED ORDER — IOHEXOL 300 MG/ML  SOLN
100.0000 mL | Freq: Once | INTRAMUSCULAR | Status: AC | PRN
  Administered 2012-11-28: 100 mL via INTRAVENOUS

## 2012-11-28 MED ORDER — KETOROLAC TROMETHAMINE 30 MG/ML IJ SOLN
30.0000 mg | Freq: Once | INTRAMUSCULAR | Status: AC
  Administered 2012-11-28: 30 mg via INTRAVENOUS
  Filled 2012-11-28: qty 1

## 2012-11-28 MED ORDER — TRAMADOL HCL 50 MG PO TABS: 50.0000 mg | ORAL_TABLET | Freq: Four times a day (QID) | ORAL | Status: DC | PRN

## 2012-11-28 MED ORDER — TRAMADOL HCL 50 MG PO TABS
50.0000 mg | ORAL_TABLET | Freq: Once | ORAL | Status: AC
  Administered 2012-11-28: 50 mg via ORAL
  Filled 2012-11-28: qty 1

## 2012-11-28 MED ORDER — IOHEXOL 300 MG/ML  SOLN: 50.0000 mL | Freq: Once | INTRAMUSCULAR | Status: DC | PRN

## 2012-11-28 MED ORDER — SODIUM CHLORIDE 0.9 % IV BOLUS (SEPSIS)
1000.0000 mL | Freq: Once | INTRAVENOUS | Status: AC
  Administered 2012-11-28: 1000 mL via INTRAVENOUS
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
## 2012-11-28 NOTE — ED Notes (Signed)
Patient complaining of no pain relief from medication. Advised MD. Verbal order for tramadol, 50mg , PO.
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
## 2012-12-05 ENCOUNTER — Emergency Department (HOSPITAL_COMMUNITY)
Admission: EM | Admit: 2012-12-05 | Discharge: 2012-12-05 | Disposition: A | Discharge status: Home / Self Care | Payer: Medicaid Other | Attending: Emergency Medicine | Admitting: Emergency Medicine

## 2012-12-05 ENCOUNTER — Encounter (HOSPITAL_COMMUNITY): Payer: Self-pay | Admitting: Emergency Medicine

## 2012-12-05 ENCOUNTER — Emergency Department (HOSPITAL_COMMUNITY): Payer: Medicaid Other

## 2012-12-05 DIAGNOSIS — S61209A Unspecified open wound of unspecified finger without damage to nail, initial encounter: Secondary | ICD-10-CM | POA: Insufficient documentation

## 2012-12-05 DIAGNOSIS — F17200 Nicotine dependence, unspecified, uncomplicated: Secondary | ICD-10-CM | POA: Insufficient documentation

## 2012-12-05 DIAGNOSIS — Y9289 Other specified places as the place of occurrence of the external cause: Secondary | ICD-10-CM | POA: Insufficient documentation

## 2012-12-05 DIAGNOSIS — Z87448 Personal history of other diseases of urinary system: Secondary | ICD-10-CM | POA: Insufficient documentation

## 2012-12-05 DIAGNOSIS — J45909 Unspecified asthma, uncomplicated: Secondary | ICD-10-CM | POA: Insufficient documentation

## 2012-12-05 DIAGNOSIS — IMO0002 Reserved for concepts with insufficient information to code with codable children: Secondary | ICD-10-CM | POA: Insufficient documentation

## 2012-12-05 DIAGNOSIS — Y93E1 Activity, personal bathing and showering: Secondary | ICD-10-CM | POA: Insufficient documentation

## 2012-12-05 DIAGNOSIS — S6991XA Unspecified injury of right wrist, hand and finger(s), initial encounter: Secondary | ICD-10-CM

## 2012-12-05 DIAGNOSIS — Z79899 Other long term (current) drug therapy: Secondary | ICD-10-CM | POA: Insufficient documentation

## 2012-12-05 DIAGNOSIS — F4310 Post-traumatic stress disorder, unspecified: Secondary | ICD-10-CM | POA: Insufficient documentation

## 2012-12-05 DIAGNOSIS — F411 Generalized anxiety disorder: Secondary | ICD-10-CM | POA: Insufficient documentation

## 2012-12-05 DIAGNOSIS — F319 Bipolar disorder, unspecified: Secondary | ICD-10-CM | POA: Insufficient documentation

## 2012-12-05 MED ORDER — CEPHALEXIN 250 MG PO CAPS: 500.0000 mg | ORAL_CAPSULE | Freq: Two times a day (BID) | ORAL | Status: DC

## 2012-12-05 MED ORDER — TRAMADOL HCL 50 MG PO TABS: 50.0000 mg | ORAL_TABLET | Freq: Four times a day (QID) | ORAL | Status: DC | PRN

## 2012-12-05 MED ORDER — BUPIVACAINE HCL (PF) 0.5 % IJ SOLN
INTRAMUSCULAR | Status: AC
  Filled 2012-12-05: qty 30

## 2012-12-05 NOTE — ED Provider Notes (Signed)
History     CSN: [id number]  Arrival date & time 12/05/12  1241   First MD Initiated Contact with Patient 12/05/12 1347      Chief Complaint  Patient presents with  . Hand Pain    (Consider location/radiation/quality/duration/timing/severity/associated sxs/prior treatment) HPI Katherine Burgess is a 32 y.o. female who presents to the ED with a finger injury. The injury is to the right ring finger. The onset was sudden. She was getting out of the shower and her right hand slipped. She has artifical nails and the one on the right ring finger got caught and pulled the real nail partially out of the nail bed. She complains of pain and some bleeding to the area. The history was provided by the patient.  Past Medical History  Diagnosis Date  . Depression   . Asthma   . Anxiety   . Bipolar 1 disorder   . PTSD (post-traumatic stress disorder)   . Renal disorder     Past Surgical History  Procedure Laterality Date  . Kidney stone surgery    . Leep      Family History  Problem Relation Age of Onset  . Thyroid disease Father   . Heart failure Father   . Diabetes Father   . Hypertension Father     History  Substance Use Topics  . Smoking status: Current Every Day Smoker -- 2.00 packs/day    Types: Cigarettes  . Smokeless tobacco: Not on file  . Alcohol Use: No    OB History   Grav Para Term Preterm Abortions TAB SAB Ect Mult Living   1               Review of Systems  Constitutional: Negative for fever.  HENT: Negative for neck pain.   Gastrointestinal: Negative for nausea and vomiting.  Musculoskeletal:       Nail bed injury  Skin: Positive for wound.  Allergic/Immunologic: Negative for immunocompromised state.  Neurological: Negative for dizziness and headaches.  Psychiatric/Behavioral: The patient is not nervous/anxious.     Allergies  Bactrim; Flagyl; Onion; and Peanuts  Home Medications   Current Outpatient Rx  Name  Route  Sig  Dispense  Refill  .  albuterol (PROVENTIL HFA;VENTOLIN HFA) 108 (90 BASE) MCG/ACT inhaler   Inhalation   Inhale 2 puffs into the lungs every 6 (six) hours as needed for wheezing.         Marland Kitchen ALPRAZolam (XANAX) 0.5 MG tablet   Oral   Take 0.5 mg by mouth 4 (four) times daily.         . citalopram (CELEXA) 40 MG tablet   Oral   Take 40 mg by mouth daily.         Marland Kitchen lithium 300 MG tablet   Oral   Take 300 mg by mouth 3 (three) times daily.         . ondansetron (ZOFRAN) 4 MG tablet   Oral   Take 1 tablet (4 mg total) by mouth every 6 (six) hours as needed for nausea.   20 tablet   0   . traMADol (ULTRAM) 50 MG tablet   Oral   Take 1 tablet (50 mg total) by mouth every 6 (six) hours as needed for pain.   15 tablet   0   . traZODone (DESYREL) 100 MG tablet   Oral   Take 200 mg by mouth at bedtime.           BP  117/66  Pulse 75  Temp(Src) 98.6 F (37 C) (Oral)  Resp 18  SpO2 99%  LMP 11/24/2012  Breastfeeding? Unknown  Physical Exam  Nursing note and vitals reviewed. Constitutional: She is oriented to person, place, and time. She appears well-developed and well-nourished. No distress.  HENT:  Head: Normocephalic.  Eyes: EOM are normal.  Neck: Neck supple.  Cardiovascular: Normal rate.   Pulmonary/Chest: Effort normal.  Musculoskeletal:       Right hand: She exhibits tenderness. She exhibits normal range of motion. Normal sensation noted.       Hands: Partial nail avulsion ring finger of right hand.  Neurological: She is alert and oriented to person, place, and time. No cranial nerve deficit.  Skin: Skin is warm and dry.  Psychiatric: She has a normal mood and affect. Her behavior is normal.   ED Course  Procedures (including critical care time)  Digital block of right ring finger using Bupivacaine 0.5% Hand soaked in antibacterial soap and NSS. Using sterile pickups and needle holder nail bed that was exposed was placed back under the bed. Procedure done without  difficulty. Minimal bleeding.   MDM  Dr. Hyacinth Meeker in to see the patient and discuss plan of care. Patient voices understanding. Will apply dressing to the area. Patient will not cut nails for the next 2 week or polis or remove the artificial nail. Patient stable for discharge home without any immediate complications.    Medication List    TAKE these medications       cephALEXin 250 MG capsule  Commonly known as:  KEFLEX  Take 2 capsules (500 mg total) by mouth 2 (two) times daily.     traMADol 50 MG tablet  Commonly known as:  ULTRAM  Take 1 tablet (50 mg total) by mouth every 6 (six) hours as needed for pain.      ASK your doctor about these medications       albuterol 108 (90 BASE) MCG/ACT inhaler  Commonly known as:  PROVENTIL HFA;VENTOLIN HFA  Inhale 2 puffs into the lungs every 6 (six) hours as needed for wheezing.     ALPRAZolam 0.5 MG tablet  Commonly known as:  XANAX  Take 0.5 mg by mouth 3 (three) times daily.     HYDROcodone-acetaminophen 5-325 MG per tablet  Commonly known as:  NORCO/VICODIN  Take 1 tablet by mouth every 6 (six) hours as needed for pain.               [place], [place] 12/05/12 [phone]

## 2012-12-05 NOTE — ED Notes (Signed)
Pt getting out of shower and r hand slipped. Pt has false nails on and got r ring caught and took real nail off. Nail hanging to cuticle. Nad. Bleeding controlled.

## 2012-12-06 NOTE — ED Provider Notes (Signed)
Medical screening examination/treatment/procedure(s) were performed by non-physician practitioner and as supervising physician I was immediately available for consultation/collaboration.   Wil Slape, MD 12/06/12 0811 

## 2013-01-19 ENCOUNTER — Emergency Department (HOSPITAL_COMMUNITY)
Admission: EM | Admit: 2013-01-19 | Discharge: 2013-01-19 | Disposition: A | Discharge status: Home / Self Care | Payer: Medicaid Other | Attending: Emergency Medicine | Admitting: Emergency Medicine

## 2013-01-19 ENCOUNTER — Encounter (HOSPITAL_COMMUNITY): Payer: Self-pay | Admitting: Emergency Medicine

## 2013-01-19 ENCOUNTER — Emergency Department (HOSPITAL_COMMUNITY): Payer: Medicaid Other

## 2013-01-19 DIAGNOSIS — Y929 Unspecified place or not applicable: Secondary | ICD-10-CM | POA: Insufficient documentation

## 2013-01-19 DIAGNOSIS — S20211A Contusion of right front wall of thorax, initial encounter: Secondary | ICD-10-CM

## 2013-01-19 DIAGNOSIS — Y9389 Activity, other specified: Secondary | ICD-10-CM | POA: Insufficient documentation

## 2013-01-19 DIAGNOSIS — S20212A Contusion of left front wall of thorax, initial encounter: Secondary | ICD-10-CM

## 2013-01-19 DIAGNOSIS — S20219A Contusion of unspecified front wall of thorax, initial encounter: Secondary | ICD-10-CM | POA: Insufficient documentation

## 2013-01-19 DIAGNOSIS — IMO0002 Reserved for concepts with insufficient information to code with codable children: Secondary | ICD-10-CM | POA: Insufficient documentation

## 2013-01-19 DIAGNOSIS — Z87448 Personal history of other diseases of urinary system: Secondary | ICD-10-CM | POA: Insufficient documentation

## 2013-01-19 DIAGNOSIS — S0990XA Unspecified injury of head, initial encounter: Secondary | ICD-10-CM | POA: Insufficient documentation

## 2013-01-19 DIAGNOSIS — F319 Bipolar disorder, unspecified: Secondary | ICD-10-CM | POA: Insufficient documentation

## 2013-01-19 DIAGNOSIS — F411 Generalized anxiety disorder: Secondary | ICD-10-CM | POA: Insufficient documentation

## 2013-01-19 DIAGNOSIS — F4310 Post-traumatic stress disorder, unspecified: Secondary | ICD-10-CM | POA: Insufficient documentation

## 2013-01-19 DIAGNOSIS — F17200 Nicotine dependence, unspecified, uncomplicated: Secondary | ICD-10-CM | POA: Insufficient documentation

## 2013-01-19 DIAGNOSIS — J45909 Unspecified asthma, uncomplicated: Secondary | ICD-10-CM | POA: Insufficient documentation

## 2013-01-19 DIAGNOSIS — R296 Repeated falls: Secondary | ICD-10-CM | POA: Insufficient documentation

## 2013-01-19 DIAGNOSIS — Z79899 Other long term (current) drug therapy: Secondary | ICD-10-CM | POA: Insufficient documentation

## 2013-01-19 LAB — POCT I-STAT TROPONIN I

## 2013-01-19 MED ORDER — ALBUTEROL SULFATE (5 MG/ML) 0.5% IN NEBU
5.0000 mg | INHALATION_SOLUTION | Freq: Once | RESPIRATORY_TRACT | Status: AC
  Administered 2013-01-19: 5 mg via RESPIRATORY_TRACT
  Filled 2013-01-19: qty 1

## 2013-01-19 MED ORDER — KETOROLAC TROMETHAMINE 60 MG/2ML IM SOLN
60.0000 mg | Freq: Once | INTRAMUSCULAR | Status: AC
  Administered 2013-01-19: 60 mg via INTRAMUSCULAR
  Filled 2013-01-19: qty 2

## 2013-01-19 MED ORDER — TRAMADOL HCL 50 MG PO TABS
100.0000 mg | ORAL_TABLET | Freq: Once | ORAL | Status: AC
  Administered 2013-01-19: 100 mg via ORAL
  Filled 2013-01-19: qty 1

## 2013-01-19 MED ORDER — ALBUTEROL SULFATE HFA 108 (90 BASE) MCG/ACT IN AERS: 2.0000 | INHALATION_SPRAY | RESPIRATORY_TRACT | Status: DC | PRN

## 2013-01-19 MED ORDER — ACETAMINOPHEN 325 MG PO TABS
650.0000 mg | ORAL_TABLET | Freq: Once | ORAL | Status: AC
  Administered 2013-01-19: 650 mg via ORAL
  Filled 2013-01-19: qty 2

## 2013-01-19 MED ORDER — TRAMADOL HCL 50 MG PO TABS: 100.0000 mg | ORAL_TABLET | Freq: Four times a day (QID) | ORAL | Status: DC | PRN

## 2013-01-19 MED ORDER — IPRATROPIUM BROMIDE 0.02 % IN SOLN
0.5000 mg | Freq: Once | RESPIRATORY_TRACT | Status: DC
  Filled 2013-01-19: qty 2.5
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
## 2013-03-07 ENCOUNTER — Encounter (HOSPITAL_COMMUNITY): Payer: Self-pay | Admitting: Emergency Medicine

## 2013-03-07 ENCOUNTER — Emergency Department (HOSPITAL_COMMUNITY)
Admission: EM | Admit: 2013-03-07 | Discharge: 2013-03-07 | Disposition: A | Discharge status: Home / Self Care | Payer: Medicaid Other | Attending: Emergency Medicine | Admitting: Emergency Medicine

## 2013-03-07 DIAGNOSIS — R197 Diarrhea, unspecified: Secondary | ICD-10-CM | POA: Insufficient documentation

## 2013-03-07 DIAGNOSIS — Y93E1 Activity, personal bathing and showering: Secondary | ICD-10-CM | POA: Insufficient documentation

## 2013-03-07 DIAGNOSIS — R0789 Other chest pain: Secondary | ICD-10-CM | POA: Insufficient documentation

## 2013-03-07 DIAGNOSIS — R110 Nausea: Secondary | ICD-10-CM | POA: Insufficient documentation

## 2013-03-07 DIAGNOSIS — F17200 Nicotine dependence, unspecified, uncomplicated: Secondary | ICD-10-CM | POA: Insufficient documentation

## 2013-03-07 DIAGNOSIS — Z3202 Encounter for pregnancy test, result negative: Secondary | ICD-10-CM | POA: Insufficient documentation

## 2013-03-07 DIAGNOSIS — R55 Syncope and collapse: Secondary | ICD-10-CM | POA: Insufficient documentation

## 2013-03-07 DIAGNOSIS — M545 Low back pain, unspecified: Secondary | ICD-10-CM | POA: Insufficient documentation

## 2013-03-07 DIAGNOSIS — F329 Major depressive disorder, single episode, unspecified: Secondary | ICD-10-CM | POA: Insufficient documentation

## 2013-03-07 DIAGNOSIS — F411 Generalized anxiety disorder: Secondary | ICD-10-CM | POA: Insufficient documentation

## 2013-03-07 DIAGNOSIS — Z8659 Personal history of other mental and behavioral disorders: Secondary | ICD-10-CM | POA: Insufficient documentation

## 2013-03-07 DIAGNOSIS — Y9289 Other specified places as the place of occurrence of the external cause: Secondary | ICD-10-CM | POA: Insufficient documentation

## 2013-03-07 DIAGNOSIS — R296 Repeated falls: Secondary | ICD-10-CM | POA: Insufficient documentation

## 2013-03-07 DIAGNOSIS — Z87448 Personal history of other diseases of urinary system: Secondary | ICD-10-CM | POA: Insufficient documentation

## 2013-03-07 DIAGNOSIS — J45909 Unspecified asthma, uncomplicated: Secondary | ICD-10-CM | POA: Insufficient documentation

## 2013-03-07 DIAGNOSIS — M549 Dorsalgia, unspecified: Secondary | ICD-10-CM | POA: Insufficient documentation

## 2013-03-07 DIAGNOSIS — F3289 Other specified depressive episodes: Secondary | ICD-10-CM | POA: Insufficient documentation

## 2013-03-07 DIAGNOSIS — R109 Unspecified abdominal pain: Secondary | ICD-10-CM | POA: Insufficient documentation

## 2013-03-07 LAB — CBC WITH DIFFERENTIAL/PLATELET
Basophils Absolute: 0 10*3/uL (ref 0.0–0.1)
Eosinophils Relative: 0 % (ref 0–5)
Lymphocytes Relative: 19 % (ref 12–46)
Neutro Abs: 7.1 10*3/uL (ref 1.7–7.7)
Neutrophils Relative %: 77 % (ref 43–77)
Platelets: 331 10*3/uL (ref 150–400)
RBC: 4.28 MIL/uL (ref 3.87–5.11)
RDW: 13.1 % (ref 11.5–15.5)
WBC: 9.2 10*3/uL (ref 4.0–10.5)

## 2013-03-07 LAB — URINALYSIS, ROUTINE W REFLEX MICROSCOPIC
Glucose, UA: NEGATIVE mg/dL
Leukocytes, UA: NEGATIVE
Nitrite: NEGATIVE
Specific Gravity, Urine: >1.03 — ABNORMAL HIGH (ref 1.005–1.030)
pH: 6 (ref 5.0–8.0)

## 2013-03-07 LAB — URINE MICROSCOPIC-ADD ON

## 2013-03-07 LAB — BASIC METABOLIC PANEL
CO2: 24 mEq/L (ref 19–32)
Calcium: 9 mg/dL (ref 8.4–10.5)
Potassium: 4.1 mEq/L (ref 3.5–5.1)
Sodium: 136 mEq/L (ref 135–145)

## 2013-03-07 LAB — PREGNANCY, URINE: Preg Test, Ur: NEGATIVE

## 2013-03-07 MED ORDER — KETOROLAC TROMETHAMINE 60 MG/2ML IM SOLN: 60.0000 mg | Freq: Once | INTRAMUSCULAR | Status: AC

## 2013-03-07 MED ORDER — KETOROLAC TROMETHAMINE 60 MG/2ML IM SOLN
INTRAMUSCULAR | Status: AC
  Administered 2013-03-07: 60 mg via INTRAMUSCULAR
  Filled 2013-03-07: qty 2

## 2013-03-07 MED ORDER — ONDANSETRON 4 MG PO TBDP
4.0000 mg | ORAL_TABLET | Freq: Once | ORAL | Status: AC
  Administered 2013-03-07: 4 mg via ORAL
  Filled 2013-03-07: qty 1

## 2013-03-07 NOTE — ED Notes (Signed)
Pt told EDP that she didn't know if she passed out or not.

## 2013-03-07 NOTE — ED Notes (Signed)
Pt fell into shower today. States hit head but no LOC. C/o lower abd pain with nausea. Denies v/d. C/o lightheadedness.denies vag d/c.denies gu sx's. C/o lower and mid back pain from fall. Pt stated she was supposed to be in court today. Nad. Mm wet
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
## 2013-03-07 NOTE — ED Notes (Signed)
Pt advised was getting toradol and she shook her head yes. Denies any allergy to toradol.

## 2013-03-30 ENCOUNTER — Ambulatory Visit (INDEPENDENT_AMBULATORY_CARE_PROVIDER_SITE_OTHER): Payer: Medicaid Other | Admitting: Obstetrics & Gynecology

## 2013-03-30 ENCOUNTER — Encounter: Payer: Self-pay | Admitting: Obstetrics & Gynecology

## 2013-03-30 VITALS — BP 108/64 | Ht 60.0 in | Wt 132.5 lb

## 2013-03-30 DIAGNOSIS — N730 Acute parametritis and pelvic cellulitis: Secondary | ICD-10-CM

## 2013-03-30 DIAGNOSIS — Z113 Encounter for screening for infections with a predominantly sexual mode of transmission: Secondary | ICD-10-CM

## 2013-03-30 DIAGNOSIS — Z3202 Encounter for pregnancy test, result negative: Secondary | ICD-10-CM

## 2013-03-30 DIAGNOSIS — IMO0002 Reserved for concepts with insufficient information to code with codable children: Secondary | ICD-10-CM

## 2013-03-30 LAB — POCT URINE PREGNANCY: Preg Test, Ur: NEGATIVE

## 2013-03-30 MED ORDER — DOXYCYCLINE HYCLATE 50 MG PO CAPS: 100.0000 mg | ORAL_CAPSULE | Freq: Two times a day (BID) | ORAL | Status: DC
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
## 2013-03-31 LAB — GC/CHLAMYDIA PROBE AMP
CT Probe RNA: NEGATIVE
GC Probe RNA: NEGATIVE

## 2013-03-31 LAB — HEPATITIS B SURFACE ANTIGEN: Hepatitis B Surface Ag: NEGATIVE

## 2013-04-13 ENCOUNTER — Ambulatory Visit: Payer: Medicaid Other | Admitting: Obstetrics & Gynecology

## 2013-04-28 ENCOUNTER — Emergency Department (HOSPITAL_COMMUNITY)
Admission: EM | Admit: 2013-04-28 | Discharge: 2013-04-29 | Disposition: A | Discharge status: Home / Self Care | Payer: Medicaid Other | Attending: Emergency Medicine | Admitting: Emergency Medicine

## 2013-04-28 ENCOUNTER — Encounter (HOSPITAL_COMMUNITY): Payer: Self-pay | Admitting: Emergency Medicine

## 2013-04-28 DIAGNOSIS — J45909 Unspecified asthma, uncomplicated: Secondary | ICD-10-CM | POA: Insufficient documentation

## 2013-04-28 DIAGNOSIS — F319 Bipolar disorder, unspecified: Secondary | ICD-10-CM | POA: Insufficient documentation

## 2013-04-28 DIAGNOSIS — T7421XA Adult sexual abuse, confirmed, initial encounter: Secondary | ICD-10-CM | POA: Insufficient documentation

## 2013-04-28 DIAGNOSIS — T7491XA Unspecified adult maltreatment, confirmed, initial encounter: Secondary | ICD-10-CM | POA: Insufficient documentation

## 2013-04-28 DIAGNOSIS — Z3202 Encounter for pregnancy test, result negative: Secondary | ICD-10-CM | POA: Insufficient documentation

## 2013-04-28 DIAGNOSIS — F411 Generalized anxiety disorder: Secondary | ICD-10-CM | POA: Insufficient documentation

## 2013-04-28 DIAGNOSIS — Z87448 Personal history of other diseases of urinary system: Secondary | ICD-10-CM | POA: Insufficient documentation

## 2013-04-28 DIAGNOSIS — T7492XA Unspecified child maltreatment, confirmed, initial encounter: Secondary | ICD-10-CM | POA: Insufficient documentation

## 2013-04-28 DIAGNOSIS — IMO0002 Reserved for concepts with insufficient information to code with codable children: Secondary | ICD-10-CM

## 2013-04-28 DIAGNOSIS — F17200 Nicotine dependence, unspecified, uncomplicated: Secondary | ICD-10-CM | POA: Insufficient documentation

## 2013-04-28 DIAGNOSIS — Z792 Long term (current) use of antibiotics: Secondary | ICD-10-CM | POA: Insufficient documentation

## 2013-04-28 DIAGNOSIS — Z79899 Other long term (current) drug therapy: Secondary | ICD-10-CM | POA: Insufficient documentation
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
## 2013-04-28 NOTE — ED Notes (Signed)
Contacted SANE Nurse for patient. JGW
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
## 2013-04-29 MED ORDER — STERILE WATER FOR INJECTION IJ SOLN
INTRAMUSCULAR | Status: AC
  Filled 2013-04-29: qty 10

## 2013-04-29 MED ORDER — AZITHROMYCIN 250 MG PO TABS
1000.0000 mg | ORAL_TABLET | Freq: Once | ORAL | Status: AC
  Administered 2013-04-29: 1000 mg via ORAL
  Filled 2013-04-29: qty 4

## 2013-04-29 MED ORDER — TRAMADOL HCL 50 MG PO TABS: 50.0000 mg | ORAL_TABLET | Freq: Four times a day (QID) | ORAL | Status: DC | PRN

## 2013-04-29 MED ORDER — PROMETHAZINE HCL 25 MG PO TABS: 25.0000 mg | ORAL_TABLET | Freq: Four times a day (QID) | ORAL | Status: DC | PRN

## 2013-04-29 MED ORDER — CEFTRIAXONE SODIUM 250 MG IJ SOLR
250.0000 mg | Freq: Once | INTRAMUSCULAR | Status: AC
  Administered 2013-04-29: 250 mg via INTRAMUSCULAR
  Filled 2013-04-29: qty 250

## 2013-04-29 MED ORDER — IBUPROFEN 800 MG PO TABS
800.0000 mg | ORAL_TABLET | Freq: Once | ORAL | Status: AC
  Administered 2013-04-29: 800 mg via ORAL
  Filled 2013-04-29: qty 1
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
## 2013-05-22 ENCOUNTER — Encounter (HOSPITAL_COMMUNITY): Payer: Self-pay | Admitting: Emergency Medicine

## 2013-05-22 ENCOUNTER — Emergency Department (HOSPITAL_COMMUNITY)
Admission: EM | Admit: 2013-05-22 | Discharge: 2013-05-22 | Disposition: A | Discharge status: Home / Self Care | Payer: Medicaid Other | Attending: Emergency Medicine | Admitting: Emergency Medicine

## 2013-05-22 DIAGNOSIS — N760 Acute vaginitis: Secondary | ICD-10-CM | POA: Insufficient documentation

## 2013-05-22 DIAGNOSIS — Z87448 Personal history of other diseases of urinary system: Secondary | ICD-10-CM | POA: Insufficient documentation

## 2013-05-22 DIAGNOSIS — R350 Frequency of micturition: Secondary | ICD-10-CM | POA: Insufficient documentation

## 2013-05-22 DIAGNOSIS — R3915 Urgency of urination: Secondary | ICD-10-CM | POA: Insufficient documentation

## 2013-05-22 DIAGNOSIS — L0231 Cutaneous abscess of buttock: Secondary | ICD-10-CM | POA: Insufficient documentation

## 2013-05-22 DIAGNOSIS — R102 Pelvic and perineal pain: Secondary | ICD-10-CM

## 2013-05-22 DIAGNOSIS — H53149 Visual discomfort, unspecified: Secondary | ICD-10-CM | POA: Insufficient documentation

## 2013-05-22 DIAGNOSIS — Z792 Long term (current) use of antibiotics: Secondary | ICD-10-CM | POA: Insufficient documentation

## 2013-05-22 DIAGNOSIS — F329 Major depressive disorder, single episode, unspecified: Secondary | ICD-10-CM | POA: Insufficient documentation

## 2013-05-22 DIAGNOSIS — J45901 Unspecified asthma with (acute) exacerbation: Secondary | ICD-10-CM | POA: Insufficient documentation

## 2013-05-22 DIAGNOSIS — F17200 Nicotine dependence, unspecified, uncomplicated: Secondary | ICD-10-CM | POA: Insufficient documentation

## 2013-05-22 DIAGNOSIS — F411 Generalized anxiety disorder: Secondary | ICD-10-CM | POA: Insufficient documentation

## 2013-05-22 DIAGNOSIS — F3289 Other specified depressive episodes: Secondary | ICD-10-CM | POA: Insufficient documentation

## 2013-05-22 DIAGNOSIS — A499 Bacterial infection, unspecified: Secondary | ICD-10-CM | POA: Insufficient documentation

## 2013-05-22 DIAGNOSIS — B9689 Other specified bacterial agents as the cause of diseases classified elsewhere: Secondary | ICD-10-CM | POA: Insufficient documentation

## 2013-05-22 HISTORY — DX: Other chronic pain: R10.2

## 2013-05-22 HISTORY — DX: Pelvic and perineal pain unspecified side: R10.20

## 2013-05-22 HISTORY — DX: Other chronic pain: G89.29

## 2013-05-22 LAB — CBC WITH DIFFERENTIAL/PLATELET
Basophils Absolute: 0 10*3/uL (ref 0.0–0.1)
Eosinophils Absolute: 0.2 10*3/uL (ref 0.0–0.7)
HCT: 35.2 % — ABNORMAL LOW (ref 36.0–46.0)
Hemoglobin: 11.4 g/dL — ABNORMAL LOW (ref 12.0–15.0)
Lymphocytes Relative: 39 % (ref 12–46)
Lymphs Abs: 3.5 10*3/uL (ref 0.7–4.0)
MCHC: 32.4 g/dL (ref 30.0–36.0)
MCV: 96.4 fL (ref 78.0–100.0)
Monocytes Relative: 7 % (ref 3–12)
Neutro Abs: 4.6 10*3/uL (ref 1.7–7.7)
Platelets: 297 10*3/uL (ref 150–400)
RBC: 3.65 MIL/uL — ABNORMAL LOW (ref 3.87–5.11)
RDW: 13.3 % (ref 11.5–15.5)
WBC: 8.9 10*3/uL (ref 4.0–10.5)

## 2013-05-22 LAB — URINALYSIS, ROUTINE W REFLEX MICROSCOPIC
Bilirubin Urine: NEGATIVE
Glucose, UA: NEGATIVE mg/dL
Ketones, ur: NEGATIVE mg/dL
Leukocytes, UA: NEGATIVE
Protein, ur: NEGATIVE mg/dL
Specific Gravity, Urine: 1.025 (ref 1.005–1.030)
Urobilinogen, UA: 0.2 mg/dL (ref 0.0–1.0)

## 2013-05-22 LAB — WET PREP, GENITAL
Trich, Wet Prep: NONE SEEN
Yeast Wet Prep HPF POC: NONE SEEN

## 2013-05-22 MED ORDER — OXYCODONE-ACETAMINOPHEN 5-325 MG PO TABS: 2.0000 | ORAL_TABLET | ORAL | Status: DC | PRN

## 2013-05-22 MED ORDER — CLINDAMYCIN HCL 150 MG PO CAPS: 150.0000 mg | ORAL_CAPSULE | Freq: Four times a day (QID) | ORAL | Status: DC

## 2013-05-22 MED ORDER — CLINDAMYCIN HCL 150 MG PO CAPS
150.0000 mg | ORAL_CAPSULE | Freq: Once | ORAL | Status: AC
  Administered 2013-05-22: 150 mg via ORAL
  Filled 2013-05-22: qty 1

## 2013-05-22 MED ORDER — OXYCODONE-ACETAMINOPHEN 5-325 MG PO TABS
2.0000 | ORAL_TABLET | Freq: Once | ORAL | Status: AC
  Administered 2013-05-22: 2 via ORAL
  Filled 2013-05-22: qty 2
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
## 2013-05-22 NOTE — ED Notes (Signed)
Also complaints of areas on her buttocks that she wants checked

## 2013-05-22 NOTE — ED Notes (Signed)
Pt pelvic pain, vaginal pressure, urinary pressure, lower back pain for the past week, abscess to buttock area for the past two days, admits to chills,

## 2013-05-22 NOTE — ED Notes (Signed)
Percocet 6 pack to go tablets given per instructions H. Damian Leavell

## 2013-05-23 ENCOUNTER — Ambulatory Visit (HOSPITAL_COMMUNITY)
Admit: 2013-05-23 | Discharge: 2013-05-23 | Disposition: A | Discharge status: Home / Self Care | Payer: Medicaid Other | Attending: Physician Assistant | Admitting: Physician Assistant

## 2013-05-23 ENCOUNTER — Other Ambulatory Visit (HOSPITAL_COMMUNITY): Payer: Self-pay | Admitting: Nurse Practitioner

## 2013-05-23 ENCOUNTER — Encounter (HOSPITAL_COMMUNITY): Payer: Self-pay | Admitting: Emergency Medicine

## 2013-05-23 DIAGNOSIS — R102 Pelvic and perineal pain: Secondary | ICD-10-CM

## 2013-05-23 DIAGNOSIS — N949 Unspecified condition associated with female genital organs and menstrual cycle: Secondary | ICD-10-CM | POA: Insufficient documentation

## 2013-05-23 NOTE — ED Notes (Signed)
Return 05/23/2013 at 4:00pm for ultrasound at 4:15 Drink 32 ounces of fluid at 3:00pm and then do not void - hold urine and arrived to hospital with full bladder Enter at main hospital entrance and ask for directions to Radiology Dept.  Patient acknowledged understanding of above.

## 2013-05-23 NOTE — ED Provider Notes (Signed)
Medical screening examination/treatment/procedure(s) were performed by non-physician practitioner and as supervising physician I was immediately available for consultation/collaboration.  EKG Interpretation   None         Maleah Rabago L Garen Woolbright, MD 05/23/13 2213 

## 2013-05-24 LAB — GC/CHLAMYDIA PROBE AMP: CT Probe RNA: NEGATIVE

## 2013-06-06 MED FILL — Oxycodone w/ Acetaminophen Tab 5-325 MG: ORAL | Qty: 6 | Status: AC

## 2013-08-04 ENCOUNTER — Encounter (HOSPITAL_COMMUNITY): Payer: Self-pay | Admitting: Emergency Medicine

## 2013-08-04 ENCOUNTER — Emergency Department (HOSPITAL_COMMUNITY): Payer: Medicaid Other

## 2013-08-04 ENCOUNTER — Emergency Department (HOSPITAL_COMMUNITY)
Admission: EM | Admit: 2013-08-04 | Discharge: 2013-08-04 | Disposition: A | Discharge status: Home / Self Care | Payer: Medicaid Other | Attending: Emergency Medicine | Admitting: Emergency Medicine

## 2013-08-04 DIAGNOSIS — S0003XA Contusion of scalp, initial encounter: Secondary | ICD-10-CM | POA: Insufficient documentation

## 2013-08-04 DIAGNOSIS — F411 Generalized anxiety disorder: Secondary | ICD-10-CM | POA: Insufficient documentation

## 2013-08-04 DIAGNOSIS — F329 Major depressive disorder, single episode, unspecified: Secondary | ICD-10-CM | POA: Insufficient documentation

## 2013-08-04 DIAGNOSIS — S0083XA Contusion of other part of head, initial encounter: Secondary | ICD-10-CM

## 2013-08-04 DIAGNOSIS — Z87448 Personal history of other diseases of urinary system: Secondary | ICD-10-CM | POA: Insufficient documentation

## 2013-08-04 DIAGNOSIS — J45909 Unspecified asthma, uncomplicated: Secondary | ICD-10-CM | POA: Insufficient documentation

## 2013-08-04 DIAGNOSIS — G8929 Other chronic pain: Secondary | ICD-10-CM | POA: Insufficient documentation

## 2013-08-04 DIAGNOSIS — S1093XA Contusion of unspecified part of neck, initial encounter: Principal | ICD-10-CM

## 2013-08-04 DIAGNOSIS — F17200 Nicotine dependence, unspecified, uncomplicated: Secondary | ICD-10-CM | POA: Insufficient documentation

## 2013-08-04 DIAGNOSIS — F3289 Other specified depressive episodes: Secondary | ICD-10-CM | POA: Insufficient documentation

## 2013-08-04 DIAGNOSIS — Z79899 Other long term (current) drug therapy: Secondary | ICD-10-CM | POA: Insufficient documentation

## 2013-08-04 DIAGNOSIS — R079 Chest pain, unspecified: Secondary | ICD-10-CM

## 2013-08-04 DIAGNOSIS — S298XXA Other specified injuries of thorax, initial encounter: Secondary | ICD-10-CM | POA: Insufficient documentation

## 2013-08-04 MED ORDER — IBUPROFEN 600 MG PO TABS: 600.0000 mg | ORAL_TABLET | Freq: Three times a day (TID) | ORAL | Status: DC | PRN

## 2013-08-04 MED ORDER — IBUPROFEN 400 MG PO TABS
600.0000 mg | ORAL_TABLET | Freq: Once | ORAL | Status: AC
  Administered 2013-08-04: 600 mg via ORAL
  Filled 2013-08-04: qty 2

## 2013-08-04 MED ORDER — OXYCODONE-ACETAMINOPHEN 5-325 MG PO TABS
1.0000 | ORAL_TABLET | Freq: Once | ORAL | Status: AC
Start: 2013-08-04 — End: 2013-08-04
  Administered 2013-08-04: 1 via ORAL
  Filled 2013-08-04: qty 1
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
## 2013-08-04 NOTE — ED Notes (Addendum)
Irregular periods, no birth control, states she has been trying to get pregnant, sex 7 weeks ago
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
## 2013-08-04 NOTE — ED Notes (Signed)
Pt given detailed discharge instructions. Verbalized understanding of all. 1 script given. Sprite given to drink.
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
## 2013-08-04 NOTE — ED Notes (Signed)
Pt taken to lobby in wheelchair, per request. Walked to bathroom prior, alert.  Will attempt to call for ride home in the lobby

## 2013-08-16 ENCOUNTER — Emergency Department (HOSPITAL_COMMUNITY)
Admission: EM | Admit: 2013-08-16 | Discharge: 2013-08-16 | Disposition: A | Discharge status: Home / Self Care | Payer: Medicaid Other | Attending: Emergency Medicine | Admitting: Emergency Medicine

## 2013-08-16 ENCOUNTER — Encounter (HOSPITAL_COMMUNITY): Payer: Self-pay | Admitting: Emergency Medicine

## 2013-08-16 DIAGNOSIS — Z87448 Personal history of other diseases of urinary system: Secondary | ICD-10-CM | POA: Insufficient documentation

## 2013-08-16 DIAGNOSIS — Z79899 Other long term (current) drug therapy: Secondary | ICD-10-CM | POA: Insufficient documentation

## 2013-08-16 DIAGNOSIS — G8929 Other chronic pain: Secondary | ICD-10-CM | POA: Insufficient documentation

## 2013-08-16 DIAGNOSIS — R1110 Vomiting, unspecified: Secondary | ICD-10-CM | POA: Insufficient documentation

## 2013-08-16 DIAGNOSIS — IMO0002 Reserved for concepts with insufficient information to code with codable children: Secondary | ICD-10-CM | POA: Insufficient documentation

## 2013-08-16 DIAGNOSIS — F319 Bipolar disorder, unspecified: Secondary | ICD-10-CM | POA: Insufficient documentation

## 2013-08-16 DIAGNOSIS — F411 Generalized anxiety disorder: Secondary | ICD-10-CM | POA: Insufficient documentation

## 2013-08-16 DIAGNOSIS — Z3202 Encounter for pregnancy test, result negative: Secondary | ICD-10-CM | POA: Insufficient documentation

## 2013-08-16 DIAGNOSIS — R6883 Chills (without fever): Secondary | ICD-10-CM | POA: Insufficient documentation

## 2013-08-16 DIAGNOSIS — F17200 Nicotine dependence, unspecified, uncomplicated: Secondary | ICD-10-CM | POA: Insufficient documentation

## 2013-08-16 DIAGNOSIS — F4310 Post-traumatic stress disorder, unspecified: Secondary | ICD-10-CM | POA: Insufficient documentation

## 2013-08-16 DIAGNOSIS — J45909 Unspecified asthma, uncomplicated: Secondary | ICD-10-CM | POA: Insufficient documentation

## 2013-08-16 DIAGNOSIS — N730 Acute parametritis and pelvic cellulitis: Secondary | ICD-10-CM | POA: Insufficient documentation

## 2013-08-16 LAB — WET PREP, GENITAL
Trich, Wet Prep: NONE SEEN
Yeast Wet Prep HPF POC: NONE SEEN

## 2013-08-16 LAB — URINALYSIS, ROUTINE W REFLEX MICROSCOPIC
BILIRUBIN URINE: NEGATIVE
Glucose, UA: NEGATIVE mg/dL
Hgb urine dipstick: NEGATIVE
KETONES UR: NEGATIVE mg/dL
Leukocytes, UA: NEGATIVE
Nitrite: NEGATIVE
Protein, ur: NEGATIVE mg/dL
Specific Gravity, Urine: 1.015 (ref 1.005–1.030)
UROBILINOGEN UA: 0.2 mg/dL (ref 0.0–1.0)
pH: 6.5 (ref 5.0–8.0)

## 2013-08-16 LAB — PREGNANCY, URINE: Preg Test, Ur: NEGATIVE

## 2013-08-16 MED ORDER — METRONIDAZOLE 500 MG PO TABS
2000.0000 mg | ORAL_TABLET | Freq: Once | ORAL | Status: DC
  Filled 2013-08-16: qty 4

## 2013-08-16 MED ORDER — LIDOCAINE HCL (PF) 1 % IJ SOLN
INTRAMUSCULAR | Status: AC
  Administered 2013-08-16: 1.2 mL
  Filled 2013-08-16: qty 5

## 2013-08-16 MED ORDER — TRAMADOL HCL 50 MG PO TABS: 50.0000 mg | ORAL_TABLET | Freq: Four times a day (QID) | ORAL | Status: DC | PRN

## 2013-08-16 MED ORDER — DOXYCYCLINE HYCLATE 100 MG PO CAPS: 100.0000 mg | ORAL_CAPSULE | Freq: Two times a day (BID) | ORAL | Status: DC

## 2013-08-16 MED ORDER — CEFTRIAXONE SODIUM 250 MG IJ SOLR
250.0000 mg | Freq: Once | INTRAMUSCULAR | Status: AC
  Administered 2013-08-16: 250 mg via INTRAMUSCULAR
  Filled 2013-08-16: qty 250
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
## 2013-08-16 NOTE — Discharge Instructions (Signed)
Pelvic Inflammatory Disease  Pelvic inflammatory disease (PID) refers to an infection in some or all of the female organs. The infection can be in the uterus, ovaries, fallopian tubes, or the surrounding tissues in the pelvis. PID can cause abdominal or pelvic pain that comes on suddenly (acute pelvic pain). PID is a serious infection because it can lead to lasting (chronic) pelvic pain or the inability to have children (infertile).   CAUSES   The infection is often caused by the normal bacteria found in the vaginal tissues. PID may also be caused by an infection that is spread during sexual contact. PID can also occur following:   · The birth of a baby.    · A miscarriage.    · An abortion.    · Major pelvic surgery.    · The use of an intrauterine device (IUD).    · A sexual assault.    RISK FACTORS  Certain factors can put a person at higher risk for PID, such as:  · Being younger than 25 years.  · Being sexually active at a young age.  · Using nonbarrier contraception.  · Having multiple sexual partners.  · Having sex with someone who has symptoms of a genital infection.  · Using oral contraception.  Other times, certain behaviors can increase the possibility of getting PID, such as:  · Having sex during your period.  · Using a vaginal douche.  · Having an intrauterine device (IUD) in place.  SYMPTOMS   · Abdominal or pelvic pain.    · Fever.    · Chills.    · Abnormal vaginal discharge.  · Abnormal uterine bleeding.    · Unusual pain shortly after finishing your period.  DIAGNOSIS   Your caregiver will choose some of the following methods to make a diagnosis, such as:   · Performing a physical exam and history. A pelvic exam typically reveals a very tender uterus and surrounding pelvis.    · Ordering laboratory tests including a pregnancy test, blood tests, and urine test.   · Ordering cultures of the vagina and cervix to check for a sexually transmitted infection (STI).  · Performing an ultrasound.     · Performing a laparoscopic procedure to look inside the pelvis.    TREATMENT   · Antibiotic medicines may be prescribed and taken by mouth.    · Sexual partners may be treated when the infection is caused by a sexually transmitted disease (STD).    · Hospitalization may be needed to give antibiotics intravenously.  · Surgery may be needed, but this is rare.  It may take weeks until you are completely well. If you are diagnosed with PID, you should also be checked for human immunodeficiency virus (HIV).    HOME CARE INSTRUCTIONS   · If given, take your antibiotics as directed. Finish the medicine even if you start to feel better.    · Only take over-the-counter or prescription medicines for pain, discomfort, or fever as directed by your caregiver.    · Do not have sexual intercourse until treatment is completed or as directed by your caregiver. If PID is confirmed, your recent sexual partner(s) will need treatment.    · Keep your follow-up appointments.  SEEK MEDICAL CARE IF:   · You have increased or abnormal vaginal discharge.    · You need prescription medicine for your pain.    · You vomit.    · You cannot take your medicines.    · Your partner has an STD.    SEEK IMMEDIATE MEDICAL CARE IF:   · You have a fever.    · You have increased abdominal or   pelvic pain.    · You have chills.    · You have pain when you urinate.    · You are not better after 72 hours following treatment.    MAKE SURE YOU:   · Understand these instructions.  · Will watch your condition.  · Will get help right away if you are not doing well or get worse.  Document Released: 06/30/2005 Document Revised: 10/25/2012 Document Reviewed: 06/26/2011  [employer]® Patient Information ©2014 [employer].
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
## 2013-08-17 LAB — GC/CHLAMYDIA PROBE AMP
CT Probe RNA: POSITIVE — AB
GC PROBE AMP APTIMA: NEGATIVE

## 2013-08-18 ENCOUNTER — Ambulatory Visit (INDEPENDENT_AMBULATORY_CARE_PROVIDER_SITE_OTHER): Payer: Medicaid Other | Admitting: Obstetrics & Gynecology

## 2013-08-18 ENCOUNTER — Encounter: Payer: Self-pay | Admitting: Obstetrics & Gynecology

## 2013-08-18 VITALS — Ht 60.0 in | Wt 136.0 lb

## 2013-08-18 DIAGNOSIS — IMO0002 Reserved for concepts with insufficient information to code with codable children: Secondary | ICD-10-CM

## 2013-08-18 DIAGNOSIS — N7013 Chronic salpingitis and oophoritis: Secondary | ICD-10-CM

## 2013-08-18 DIAGNOSIS — N7011 Chronic salpingitis: Secondary | ICD-10-CM | POA: Insufficient documentation

## 2013-08-18 NOTE — Progress Notes (Signed)
Patient ID: Katherine Burgess, female   DOB: 01/19/1981, 33 y.o.   MRN: [id number] Height 5' (1.524 m), weight 136 lb (61.689 kg), last menstrual period 07/14/2013.  G1P0 Patient's last menstrual period was 07/14/2013.  No BCM  Patient is back in the day and saw her originally back in September for right lower quadrant pain and what appeared to be a right hydrosalpinx The left adnexa and the right ovary otherwise looked fine and sonogram I have reviewed the images themselves The patient continues to have right lower quadrant pain and pain with intercourse on that side additionally As a result we will proceed with a laparoscopic right salpingectomy Other indicated procedures I will also evaluate the patency of her left fallopian tube by doing a an intraoperative instillation of methylene blue If she has proximal obstruction surgical improvement is not possible If she were to have distal left fallopian tube obstruction our delineate a fimbrioplasty to try to improve her fertility  The patient is aware that I think her best chances of achieving pregnancy to be in vitro fertilization She understands that not only removing the tube is helpful for her pain but is also the proper management were she to date IVF in the future as a hydrosalpinx does significantly diminished the success of in vitro fertilization  Past Medical History  Diagnosis Date  . Depression   . Asthma   . Anxiety   . Bipolar 1 disorder   . PTSD (post-traumatic stress disorder)   . Renal disorder   . Chronic pelvic pain in female     Past Surgical History  Procedure Laterality Date  . Kidney stone surgery    . Leep      OB History   Grav Para Term Preterm Abortions TAB SAB Ect Mult Living   1               Allergies  Allergen Reactions  . Bactrim Anaphylaxis, Swelling and Rash  . Flagyl [Metronidazole Hcl] Anaphylaxis, Swelling and Rash  . Onion Anaphylaxis, Swelling and Rash  . Peanuts [Nuts] Anaphylaxis,  Swelling and Rash    History   Social History  . Marital Status: Single    Spouse Name: N/A    Number of Children: N/A  . Years of Education: N/A   Social History Main Topics  . Smoking status: Current Every Day Smoker -- 2.00 packs/day for 18 years    Types: Cigarettes  . Smokeless tobacco: Never Used  . Alcohol Use: Yes     Comment: occ  . Drug Use: No     Comment: not now  . Sexual Activity: Yes    Birth Control/ Protection: None   Other Topics Concern  . None   Social History Narrative  . None    Family History  Problem Relation Age of Onset  . Thyroid disease Father   . Heart failure Father   . Diabetes Father   . Hypertension Father   . Stroke Other   . Diabetes Other   . Cancer Maternal Grandmother   . Cancer Paternal Grandmother

## 2013-08-18 NOTE — ED Notes (Signed)
+   Chlamydia  Patient treated with Rocephin and Doxycyline [facility] faxed

## 2013-08-19 ENCOUNTER — Telehealth (HOSPITAL_COMMUNITY): Payer: Self-pay | Admitting: *Deleted

## 2013-08-19 NOTE — ED Notes (Signed)
Patient informed of positive results after id'd x 2 and informed of need to notify partner to be treated. 

## 2013-08-25 ENCOUNTER — Encounter (HOSPITAL_COMMUNITY): Payer: Self-pay | Admitting: Pharmacy Technician

## 2013-08-30 ENCOUNTER — Emergency Department (HOSPITAL_COMMUNITY)
Admission: EM | Admit: 2013-08-30 | Discharge: 2013-08-30 | Disposition: A | Discharge status: Home / Self Care | Payer: Medicaid Other | Attending: Emergency Medicine | Admitting: Emergency Medicine

## 2013-08-30 ENCOUNTER — Encounter (HOSPITAL_COMMUNITY): Payer: Self-pay | Admitting: Emergency Medicine

## 2013-08-30 DIAGNOSIS — F17200 Nicotine dependence, unspecified, uncomplicated: Secondary | ICD-10-CM | POA: Insufficient documentation

## 2013-08-30 DIAGNOSIS — J45909 Unspecified asthma, uncomplicated: Secondary | ICD-10-CM | POA: Insufficient documentation

## 2013-08-30 DIAGNOSIS — Z8742 Personal history of other diseases of the female genital tract: Secondary | ICD-10-CM | POA: Insufficient documentation

## 2013-08-30 DIAGNOSIS — F319 Bipolar disorder, unspecified: Secondary | ICD-10-CM | POA: Insufficient documentation

## 2013-08-30 DIAGNOSIS — Z3202 Encounter for pregnancy test, result negative: Secondary | ICD-10-CM | POA: Insufficient documentation

## 2013-08-30 DIAGNOSIS — Z8619 Personal history of other infectious and parasitic diseases: Secondary | ICD-10-CM | POA: Insufficient documentation

## 2013-08-30 DIAGNOSIS — F411 Generalized anxiety disorder: Secondary | ICD-10-CM | POA: Insufficient documentation

## 2013-08-30 DIAGNOSIS — F4310 Post-traumatic stress disorder, unspecified: Secondary | ICD-10-CM | POA: Insufficient documentation

## 2013-08-30 DIAGNOSIS — N898 Other specified noninflammatory disorders of vagina: Secondary | ICD-10-CM

## 2013-08-30 DIAGNOSIS — Z79899 Other long term (current) drug therapy: Secondary | ICD-10-CM | POA: Insufficient documentation

## 2013-08-30 HISTORY — DX: Reserved for concepts with insufficient information to code with codable children: IMO0002

## 2013-08-30 LAB — URINALYSIS, ROUTINE W REFLEX MICROSCOPIC
Bilirubin Urine: NEGATIVE
GLUCOSE, UA: NEGATIVE mg/dL
KETONES UR: NEGATIVE mg/dL
Leukocytes, UA: NEGATIVE
Nitrite: NEGATIVE
PROTEIN: NEGATIVE mg/dL
Specific Gravity, Urine: 1.015 (ref 1.005–1.030)
UROBILINOGEN UA: 0.2 mg/dL (ref 0.0–1.0)
pH: 6 (ref 5.0–8.0)

## 2013-08-30 LAB — URINE MICROSCOPIC-ADD ON

## 2013-08-30 LAB — PREGNANCY, URINE: PREG TEST UR: NEGATIVE

## 2013-08-30 LAB — WET PREP, GENITAL
CLUE CELLS WET PREP: NONE SEEN
Trich, Wet Prep: NONE SEEN
Yeast Wet Prep HPF POC: NONE SEEN

## 2013-08-30 NOTE — ED Notes (Signed)
Pt reports was treated for STD on Feb 3rd and reports started having vaginal discharge this morning.  Denies pain.

## 2013-08-30 NOTE — ED Notes (Signed)
Pelvic exam performed by Dr. Judd Lienelo pt tolerated well,
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
## 2013-08-30 NOTE — Discharge Instructions (Signed)
We will call you if your cultures required further treatment.

## 2013-08-30 NOTE — ED Notes (Signed)
Pt c/o vaginal discharge that started this am, Dr. Judd LieneLo in prior to RN, see edp assessment for further,

## 2013-08-31 ENCOUNTER — Encounter (HOSPITAL_COMMUNITY)
Admission: RE | Admit: 2013-08-31 | Discharge: 2013-08-31 | Disposition: A | Discharge status: Home / Self Care | Payer: Medicaid Other | Source: Ambulatory Visit | Attending: Obstetrics & Gynecology | Admitting: Obstetrics & Gynecology

## 2013-08-31 ENCOUNTER — Encounter (HOSPITAL_COMMUNITY): Payer: Self-pay

## 2013-08-31 DIAGNOSIS — Z01812 Encounter for preprocedural laboratory examination: Secondary | ICD-10-CM | POA: Insufficient documentation

## 2013-08-31 LAB — COMPREHENSIVE METABOLIC PANEL
ALT: 9 U/L (ref 0–35)
AST: 13 U/L (ref 0–37)
Albumin: 3.9 g/dL (ref 3.5–5.2)
Alkaline Phosphatase: 71 U/L (ref 39–117)
BILIRUBIN TOTAL: 0.2 mg/dL — AB (ref 0.3–1.2)
BUN: 12 mg/dL (ref 6–23)
CO2: 23 mEq/L (ref 19–32)
CREATININE: 0.81 mg/dL (ref 0.50–1.10)
Calcium: 9 mg/dL (ref 8.4–10.5)
Chloride: 103 mEq/L (ref 96–112)
GFR calc non Af Amer: >90 mL/min (ref >90)
GLUCOSE: 82 mg/dL (ref 70–99)
POTASSIUM: 4.6 meq/L (ref 3.7–5.3)
Sodium: 139 mEq/L (ref 137–147)
Total Protein: 7.5 g/dL (ref 6.0–8.3)

## 2013-08-31 LAB — URINALYSIS, ROUTINE W REFLEX MICROSCOPIC
BILIRUBIN URINE: NEGATIVE
Glucose, UA: NEGATIVE mg/dL
Hgb urine dipstick: NEGATIVE
KETONES UR: NEGATIVE mg/dL
Leukocytes, UA: NEGATIVE
NITRITE: NEGATIVE
PROTEIN: NEGATIVE mg/dL
Specific Gravity, Urine: 1.025 (ref 1.005–1.030)
UROBILINOGEN UA: 0.2 mg/dL (ref 0.0–1.0)
pH: 5.5 (ref 5.0–8.0)

## 2013-08-31 LAB — CBC
HEMATOCRIT: 39.2 % (ref 36.0–46.0)
HEMOGLOBIN: 13.2 g/dL (ref 12.0–15.0)
MCH: 33.2 pg (ref 26.0–34.0)
MCHC: 33.7 g/dL (ref 30.0–36.0)
MCV: 98.7 fL (ref 78.0–100.0)
Platelets: 326 10*3/uL (ref 150–400)
RBC: 3.97 MIL/uL (ref 3.87–5.11)
RDW: 13.5 % (ref 11.5–15.5)
WBC: 5.5 10*3/uL (ref 4.0–10.5)

## 2013-08-31 LAB — GC/CHLAMYDIA PROBE AMP
CT PROBE, AMP APTIMA: NEGATIVE
GC Probe RNA: NEGATIVE

## 2013-08-31 LAB — HCG, QUANTITATIVE, PREGNANCY

## 2013-08-31 NOTE — Patient Instructions (Signed)
Katherine Burgess  08/31/2013   Your procedure is scheduled on:   09/07/2013  [facility]  [facility].  Call this number if you have problems the morning of surgery: [phone]   Remember:   Do not eat food or drink liquids after midnight.   Take these medicines the morning of surgery with A SIP OF WATER: celexa, clonazepam, lithium, tramdol. Take your albuterola before you come.   Do not wear jewelry, make-up or nail polish.  Do not wear lotions, powders, or perfumes.   Do not shave 48 hours prior to surgery. Men may shave face and neck.  Do not bring valuables to the hospital.  [facility] any belongings or valuables.               Contacts, dentures or bridgework may not be worn into surgery.  Leave suitcase in the car. After surgery it may be brought to your room.  For patients admitted to the hospital, discharge time is determined by your treatment team.               Patients discharged the day of surgery will not be allowed to drive home.  Name and phone number of your driver: family  Special Instructions: Shower using CHG 2 nights before surgery and the night before surgery.  If you shower the day of surgery use CHG.  Use special wash - you have one bottle of CHG for all showers.  You should use approximately 1/3 of the bottle for each shower.   Please read over the following fact sheets that you were given: Pain Booklet, Coughing and Deep Breathing, Surgical Site Infection Prevention, Anesthesia Post-op Instructions and Care and Recovery After Surgery Salpingectomy Salpingectomy, also called tubectomy, is the surgical removal of one of the fallopian tubes. The fallopian tubes are tubes that are connected to the uterus. These tubes transport the egg from the ovary to the uterus. A salpingectomy may be done for various reasons, including:   A tubal (ectopic) pregnancy. This is especially true if the tube ruptures.  An infected fallopian  tube.  The need to remove the fallopian tube when removing an ovary with a cyst or tumor.  The need to remove the fallopian tube when removing the uterus.  Cancer of the fallopian tube or nearby organs. Removing one fallopian tube does not prevent you from becoming pregnant. It also does not cause problems with your menstrual periods.  LET [facility] KNOW ABOUT:  Any allergies you have.  All medicines you are taking, including vitamins, herbs, eye drops, creams, and over-the-counter medicines.  Previous problems you or members of your family have had with the use of anesthetics.  Any blood disorders you have.  Previous surgeries you have had.  Medical conditions you have. RISKS AND COMPLICATIONS  Generally, this is a safe procedure. However, as with any procedure, complications can occur. Possible complications include:  Injury to surrounding organs.  Bleeding.  Infection.  Problems related to anesthesia. BEFORE THE PROCEDURE  Ask your health care provider about changing or stopping your regular medicines. You may need to stop taking certain medicines, such as aspirin or blood thinners, at least 1 week before the surgery.  Do not eat or drink anything for at least 8 hours before the surgery.  If you smoke, do not smoke for at least 2 weeks before the surgery.  Make plans to have someone drive  you home after the procedure or after your hospital stay. Also arrange for someone to help you with activities during recovery. PROCEDURE   You will be given medicine to help you relax before the procedure (sedative). You will then be given medicine to make you sleep through the procedure (general anesthetic). These medicines will be given through an IV access tube that is put into one of your veins.  Once you are asleep, your lower abdomen will be shaved and cleaned. A thin, flexible tube (catheter) will be placed in your bladder.  The surgeon may use a laparoscopic,  robotic, or open technique for this surgery:  In the laparoscopic technique, the surgery is done through two small cuts (incisions) in the abdomen. A thin, lighted tube with a tiny camera on the end (laparoscope) is inserted into one of the incisions. The tools needed for the procedure are put through the other incision.  A robotic technique may be chosen to perform complex surgery in a small space. In the robotic technique, small incisions will be made. A camera and surgical instruments are passed through the incisions. Surgical instruments will be controlled with the help of a robotic arm.  In the open technique, the surgery is done through one large incision in the abdomen.  Using any of these techniques, the surgeon removes the fallopian tube from where it attaches to the uterus. The blood vessels will be clamped and tied.  The surgeon then uses staples or stitches to close the incision or incisions. AFTER THE PROCEDURE   You will be taken to a recovery area where your progress will be monitored for 1 3 hours.  If the laparoscopic technique was used, you may be allowed to go home after several hours. You may have some shoulder pain after the laparoscopic procedure. This is normal and usually goes away in a day or two.  If the open technique was used, you will be admitted to the hospital for a couple of days.  You will be given pain medicine if needed.  The IV access tube and catheter will be removed before you are discharged. Document Released: 11/16/2008 Document Revised: 04/20/2013 Document Reviewed: 12/22/2012 [facility] Patient Information 2014 [place], [place]. PATIENT INSTRUCTIONS POST-ANESTHESIA  IMMEDIATELY FOLLOWING SURGERY:  Do not drive or operate machinery for the first twenty four hours after surgery.  Do not make any important decisions for twenty four hours after surgery or while taking narcotic pain medications or sedatives.  If you develop intractable nausea and vomiting  or a severe headache please notify your doctor immediately.  FOLLOW-UP:  Please make an appointment with your surgeon as instructed. You do not need to follow up with anesthesia unless specifically instructed to do so.  WOUND CARE INSTRUCTIONS (if applicable):  Keep a dry clean dressing on the anesthesia/puncture wound site if there is drainage.  Once the wound has quit draining you may leave it open to air.  Generally you should leave the bandage intact for twenty four hours unless there is drainage.  If the epidural site drains for more than 36-48 hours please call the anesthesia department.  QUESTIONS?:  Please feel free to call your physician or the hospital operator if you have any questions, and they will be happy to assist you.

## 2013-08-31 NOTE — Pre-Procedure Instructions (Signed)
Patient in for PAT and instructed to leave all jewlery ar home per preoperative guidelines. Patient states that she has 2 nipple rings, a clit ring and a tongue ring that"will not come out". Instructed patient on the safety issue jewlery can cause with severe burns and cautery but continued to insist that these piercings would not come out. Waynard EdwardsBarbara Morris, Consulting civil engineerCharge RN notified.

## 2013-09-07 ENCOUNTER — Encounter (HOSPITAL_COMMUNITY): Admission: RE | Payer: Self-pay | Source: Ambulatory Visit

## 2013-09-07 ENCOUNTER — Ambulatory Visit (HOSPITAL_COMMUNITY)
Admission: RE | Admit: 2013-09-07 | Payer: Medicaid Other | Source: Ambulatory Visit | Admitting: Obstetrics & Gynecology

## 2013-09-07 SURGERY — SALPINGECTOMY, UNILATERAL, LAPAROSCOPIC
Anesthesia: General | Site: Vagina | Laterality: Right

## 2013-09-07 NOTE — OR Nursing (Signed)
Patient called this am stating she was sick, had diarrhea and wanted to let us know that she was cancelling her surgery.  I called OR to  let them know.

## 2013-09-11 ENCOUNTER — Emergency Department (HOSPITAL_COMMUNITY)
Admission: EM | Admit: 2013-09-11 | Discharge: 2013-09-11 | Disposition: A | Discharge status: Home / Self Care | Payer: Medicaid Other | Attending: Emergency Medicine | Admitting: Emergency Medicine

## 2013-09-11 ENCOUNTER — Encounter (HOSPITAL_COMMUNITY): Payer: Self-pay | Admitting: Emergency Medicine

## 2013-09-11 DIAGNOSIS — M545 Low back pain, unspecified: Secondary | ICD-10-CM | POA: Insufficient documentation

## 2013-09-11 DIAGNOSIS — J45909 Unspecified asthma, uncomplicated: Secondary | ICD-10-CM | POA: Insufficient documentation

## 2013-09-11 DIAGNOSIS — Z79899 Other long term (current) drug therapy: Secondary | ICD-10-CM | POA: Insufficient documentation

## 2013-09-11 DIAGNOSIS — G8929 Other chronic pain: Secondary | ICD-10-CM | POA: Insufficient documentation

## 2013-09-11 DIAGNOSIS — F319 Bipolar disorder, unspecified: Secondary | ICD-10-CM | POA: Insufficient documentation

## 2013-09-11 DIAGNOSIS — Z3202 Encounter for pregnancy test, result negative: Secondary | ICD-10-CM | POA: Insufficient documentation

## 2013-09-11 DIAGNOSIS — F4310 Post-traumatic stress disorder, unspecified: Secondary | ICD-10-CM | POA: Insufficient documentation

## 2013-09-11 DIAGNOSIS — M549 Dorsalgia, unspecified: Secondary | ICD-10-CM

## 2013-09-11 DIAGNOSIS — F17200 Nicotine dependence, unspecified, uncomplicated: Secondary | ICD-10-CM | POA: Insufficient documentation

## 2013-09-11 DIAGNOSIS — N739 Female pelvic inflammatory disease, unspecified: Secondary | ICD-10-CM | POA: Insufficient documentation

## 2013-09-11 DIAGNOSIS — F411 Generalized anxiety disorder: Secondary | ICD-10-CM | POA: Insufficient documentation

## 2013-09-11 LAB — URINALYSIS, ROUTINE W REFLEX MICROSCOPIC
Bilirubin Urine: NEGATIVE
Glucose, UA: NEGATIVE mg/dL
Hgb urine dipstick: NEGATIVE
Ketones, ur: NEGATIVE mg/dL
LEUKOCYTES UA: NEGATIVE
Nitrite: NEGATIVE
PROTEIN: NEGATIVE mg/dL
Urobilinogen, UA: 0.2 mg/dL (ref 0.0–1.0)
pH: 6 (ref 5.0–8.0)

## 2013-09-11 LAB — POC URINE PREG, ED: PREG TEST UR: NEGATIVE

## 2013-09-11 LAB — WET PREP, GENITAL
Clue Cells Wet Prep HPF POC: NONE SEEN
TRICH WET PREP: NONE SEEN
WBC WET PREP: NONE SEEN
Yeast Wet Prep HPF POC: NONE SEEN

## 2013-09-11 MED ORDER — CEFTRIAXONE SODIUM 250 MG IJ SOLR
250.0000 mg | Freq: Once | INTRAMUSCULAR | Status: AC
  Administered 2013-09-11: 250 mg via INTRAMUSCULAR
  Filled 2013-09-11: qty 250

## 2013-09-11 MED ORDER — AZITHROMYCIN 250 MG PO TABS
1000.0000 mg | ORAL_TABLET | Freq: Once | ORAL | Status: AC
  Administered 2013-09-11: 1000 mg via ORAL
  Filled 2013-09-11: qty 4

## 2013-09-11 MED ORDER — KETOROLAC TROMETHAMINE 60 MG/2ML IM SOLN
60.0000 mg | Freq: Once | INTRAMUSCULAR | Status: AC
  Administered 2013-09-11: 60 mg via INTRAMUSCULAR
  Filled 2013-09-11: qty 2

## 2013-09-11 MED ORDER — LIDOCAINE HCL (PF) 1 % IJ SOLN
INTRAMUSCULAR | Status: AC
  Administered 2013-09-11: 1.2 mL
  Filled 2013-09-11: qty 5

## 2013-09-11 NOTE — ED Provider Notes (Signed)
Medical screening examination/treatment/procedure(s) were performed by non-physician practitioner and as supervising physician I was immediately available for consultation/collaboration.   EKG Interpretation None       Hale Chalfin, MD 09/11/13 2250 

## 2013-09-11 NOTE — ED Notes (Signed)
Lower back pain radiating to bil flank x 1 wk.  Also reports white vaginal discharge x 1 wk.

## 2013-09-11 NOTE — Discharge Instructions (Signed)
Back Pain, Adult Low back pain is very common. About 1 in 5 people have back pain.The cause of low back pain is rarely dangerous. The pain often gets better over time.About half of people with a sudden onset of back pain feel better in just 2 weeks. About 8 in 10 people feel better by 6 weeks.  CAUSES Some common causes of back pain include:  Strain of the muscles or ligaments supporting the spine.  Wear and tear (degeneration) of the spinal discs.  Arthritis.  Direct injury to the back. DIAGNOSIS Most of the time, the direct cause of low back pain is not known.However, back pain can be treated effectively even when the exact cause of the pain is unknown.Answering your caregiver's questions about your overall health and symptoms is one of the most accurate ways to make sure the cause of your pain is not dangerous. If your caregiver needs more information, he or she may order lab work or imaging tests (X-rays or MRIs).However, even if imaging tests show changes in your back, this usually does not require surgery. HOME CARE INSTRUCTIONS For many people, back pain returns.Since low back pain is rarely dangerous, it is often a condition that people can learn to [facility] their own.   Remain active. It is stressful on the back to sit or stand in one place. Do not sit, drive, or stand in one place for more than 30 minutes at a time. Take short walks on level surfaces as soon as pain allows.Try to increase the length of time you walk each day.  Do not stay in bed.Resting more than 1 or 2 days can delay your recovery.  Do not avoid exercise or work.Your body is made to move.It is not dangerous to be active, even though your back may hurt.Your back will likely heal faster if you return to being active before your pain is gone.  Pay attention to your body when you bend and lift. Many people have less discomfortwhen lifting if they bend their knees, keep the load close to their bodies,and  avoid twisting. Often, the most comfortable positions are those that put less stress on your recovering back.  Find a comfortable position to sleep. Use a firm mattress and lie on your side with your knees slightly bent. If you lie on your back, put a pillow under your knees.  Only take over-the-counter or prescription medicines as directed by your caregiver. Over-the-counter medicines to reduce pain and inflammation are often the most helpful.Your caregiver may prescribe muscle relaxant drugs.These medicines help dull your pain so you can more quickly return to your normal activities and healthy exercise.  Put ice on the injured area.  Put ice in a plastic bag.  Place a towel between your skin and the bag.  Leave the ice on for 15-20 minutes, 03-04 times a day for the first 2 to 3 days. After that, ice and heat may be alternated to reduce pain and spasms.  Ask your caregiver about trying back exercises and gentle massage. This may be of some benefit.  Avoid feeling anxious or stressed.Stress increases muscle tension and can worsen back pain.It is important to recognize when you are anxious or stressed and learn ways to manage it.Exercise is a great option. SEEK MEDICAL CARE IF:  You have pain that is not relieved with rest or medicine.  You have pain that does not improve in 1 week.  You have new symptoms.  You are generally not feeling well. SEEK  IMMEDIATE MEDICAL CARE IF:   You have pain that radiates from your back into your legs.  You develop new bowel or bladder control problems.  You have unusual weakness or numbness in your arms or legs.  You develop nausea or vomiting.  You develop abdominal pain.  You feel faint. Document Released: 06/30/2005 Document Revised: 12/30/2011 Document Reviewed: 11/18/2010 [facility] Patient Information 2014 [place], [place].  Pelvic Inflammatory Disease Pelvic inflammatory disease (PID) refers to an infection in some or all of the  female organs. The infection can be in the uterus, ovaries, fallopian tubes, or the surrounding tissues in the pelvis. PID can cause abdominal or pelvic pain that comes on suddenly (acute pelvic pain). PID is a serious infection because it can lead to lasting (chronic) pelvic pain or the inability to have children (infertile).  CAUSES  The infection is often caused by the normal bacteria found in the vaginal tissues. PID may also be caused by an infection that is spread during sexual contact. PID can also occur following:   The birth of a baby.   A miscarriage.   An abortion.   Major pelvic surgery.   The use of an intrauterine device (IUD).   A sexual assault.  RISK FACTORS Certain factors can put a person at higher risk for PID, such as:  Being younger than 25 years.  Being sexually active at [place] age.  Usingnonbarrier contraception.  Havingmultiple sexual partners.  Having sex with someone who has symptoms of a genital infection.  Using oral contraception. Other times, certain behaviors can increase the possibility of getting PID, such as:  Having sex during your period.  Using a vaginal douche.  Having an intrauterine device (IUD) in place. SYMPTOMS   Abdominal or pelvic pain.   Fever.   Chills.   Abnormal vaginal discharge.  Abnormal uterine bleeding.   Unusual pain shortly after finishing your period. DIAGNOSIS  Your caregiver will choose some of the following methods to make a diagnosis, such as:   Performinga physical exam and history. A pelvic exam typically reveals a very tender uterus and surrounding pelvis.   Ordering laboratory tests including a pregnancy test, blood tests, and urine test.  Orderingcultures of the vagina and cervix to check for a sexually transmitted infection (STI).  Performing an ultrasound.   Performing a laparoscopic procedure to look inside the pelvis.  TREATMENT   Antibiotic medicines may be  prescribed and taken by mouth.   Sexual partners may be treated when the infection is caused by a sexually transmitted disease (STD).   Hospitalization may be needed to give antibiotics intravenously.  Surgery may be needed, but this is rare. It may take weeks until you are completely well. If you are diagnosed with PID, you should also be checked for human immunodeficiency virus (HIV). HOME CARE INSTRUCTIONS   If given, take your antibiotics as directed. Finish the medicine even if you start to feel better.   Only take over-the-counter or prescription medicines for pain, discomfort, or fever as directed by your caregiver.   Do not have sexual intercourse until treatment is completed or as directed by your caregiver. If PID is confirmed, your recent sexual partner(s) will need treatment.   Keep your follow-up appointments. SEEK MEDICAL CARE IF:   You have increased or abnormal vaginal discharge.   You need prescription medicine for your pain.   You vomit.   You cannot take your medicines.   Your partner has an STD.  SEEK IMMEDIATE MEDICAL  CARE IF:   You have a fever.   You have increased abdominal or pelvic pain.   You have chills.   You have pain when you urinate.   You are not better after 72 hours following treatment.  MAKE SURE YOU:   Understand these instructions.  Will watch your condition.  Will get help right away if you are not doing well or get worse. Document Released: 06/30/2005 Document Revised: 10/25/2012 Document Reviewed: 06/26/2011 [facility] Patient Information 2014 [place].
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
## 2013-09-12 ENCOUNTER — Emergency Department (HOSPITAL_COMMUNITY)
Admission: EM | Admit: 2013-09-12 | Discharge: 2013-09-12 | Disposition: A | Discharge status: Home / Self Care | Payer: Medicaid Other | Attending: Emergency Medicine | Admitting: Emergency Medicine

## 2013-09-12 ENCOUNTER — Encounter (HOSPITAL_COMMUNITY): Payer: Self-pay | Admitting: Emergency Medicine

## 2013-09-12 ENCOUNTER — Emergency Department (HOSPITAL_COMMUNITY): Payer: Medicaid Other

## 2013-09-12 DIAGNOSIS — S0083XA Contusion of other part of head, initial encounter: Secondary | ICD-10-CM

## 2013-09-12 DIAGNOSIS — R42 Dizziness and giddiness: Secondary | ICD-10-CM | POA: Insufficient documentation

## 2013-09-12 DIAGNOSIS — F411 Generalized anxiety disorder: Secondary | ICD-10-CM | POA: Insufficient documentation

## 2013-09-12 DIAGNOSIS — S1093XA Contusion of unspecified part of neck, initial encounter: Secondary | ICD-10-CM

## 2013-09-12 DIAGNOSIS — F319 Bipolar disorder, unspecified: Secondary | ICD-10-CM | POA: Insufficient documentation

## 2013-09-12 DIAGNOSIS — J45909 Unspecified asthma, uncomplicated: Secondary | ICD-10-CM | POA: Insufficient documentation

## 2013-09-12 DIAGNOSIS — G8929 Other chronic pain: Secondary | ICD-10-CM | POA: Insufficient documentation

## 2013-09-12 DIAGNOSIS — Z87448 Personal history of other diseases of urinary system: Secondary | ICD-10-CM | POA: Insufficient documentation

## 2013-09-12 DIAGNOSIS — Z79899 Other long term (current) drug therapy: Secondary | ICD-10-CM | POA: Insufficient documentation

## 2013-09-12 DIAGNOSIS — F17200 Nicotine dependence, unspecified, uncomplicated: Secondary | ICD-10-CM | POA: Insufficient documentation

## 2013-09-12 DIAGNOSIS — H53149 Visual discomfort, unspecified: Secondary | ICD-10-CM | POA: Insufficient documentation

## 2013-09-12 DIAGNOSIS — S0990XA Unspecified injury of head, initial encounter: Secondary | ICD-10-CM | POA: Insufficient documentation

## 2013-09-12 DIAGNOSIS — S0003XA Contusion of scalp, initial encounter: Secondary | ICD-10-CM | POA: Insufficient documentation

## 2013-09-12 DIAGNOSIS — F329 Major depressive disorder, single episode, unspecified: Secondary | ICD-10-CM | POA: Insufficient documentation

## 2013-09-12 DIAGNOSIS — Z791 Long term (current) use of non-steroidal anti-inflammatories (NSAID): Secondary | ICD-10-CM | POA: Insufficient documentation

## 2013-09-12 DIAGNOSIS — F3289 Other specified depressive episodes: Secondary | ICD-10-CM | POA: Insufficient documentation

## 2013-09-12 DIAGNOSIS — Z87442 Personal history of urinary calculi: Secondary | ICD-10-CM | POA: Insufficient documentation

## 2013-09-12 HISTORY — DX: Calculus of kidney: N20.0

## 2013-09-12 LAB — HIV ANTIBODY (ROUTINE TESTING W REFLEX): HIV: NONREACTIVE

## 2013-09-12 LAB — RPR: RPR Ser Ql: NONREACTIVE

## 2013-09-12 MED ORDER — HYDROCODONE-ACETAMINOPHEN 5-325 MG PO TABS: ORAL_TABLET | ORAL | Status: DC

## 2013-09-12 MED ORDER — NAPROXEN 500 MG PO TABS: 500.0000 mg | ORAL_TABLET | Freq: Two times a day (BID) | ORAL | Status: DC
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
## 2013-09-12 NOTE — Discharge Instructions (Signed)
Contusion  A contusion is a deep bruise. Contusions happen when an injury causes bleeding under the skin. Signs of bruising include pain, puffiness (swelling), and discolored skin. The contusion may turn blue, purple, or yellow.  HOME CARE   · Put ice on the injured area.  · Put ice in a plastic bag.  · Place a towel between your skin and the bag.  · Leave the ice on for 15-20 minutes, 03-04 times a day.  · Only take medicine as told by your doctor.  · Rest the injured area.  · If possible, raise (elevate) the injured area to lessen puffiness.  GET HELP RIGHT AWAY IF:   · You have more bruising or puffiness.  · You have pain that is getting worse.  · Your puffiness or pain is not helped by medicine.  MAKE SURE YOU:   · Understand these instructions.  · Will watch your condition.  · Will get help right away if you are not doing well or get worse.  Document Released: 12/17/2007 Document Revised: 09/22/2011 Document Reviewed: 05/05/2011  [employer]® Patient Information ©2014 [employer].

## 2013-09-13 LAB — GC/CHLAMYDIA PROBE AMP
CT Probe RNA: NEGATIVE
GC PROBE AMP APTIMA: NEGATIVE

## 2013-09-15 ENCOUNTER — Encounter: Payer: Medicaid Other | Admitting: Obstetrics & Gynecology
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
## 2013-09-15 NOTE — ED Provider Notes (Signed)
Medical screening examination/treatment/procedure(s) were performed by non-physician practitioner and as supervising physician I was immediately available for consultation/collaboration.   Hurman HornJohn M Fredis Malkiewicz, MD 09/15/13 2029

## 2013-09-19 ENCOUNTER — Encounter (HOSPITAL_COMMUNITY): Payer: Self-pay | Admitting: Emergency Medicine

## 2013-09-19 ENCOUNTER — Emergency Department (HOSPITAL_COMMUNITY)
Admission: EM | Admit: 2013-09-19 | Discharge: 2013-09-19 | Discharge status: Against Medical Advice | Payer: Medicaid Other | Attending: Emergency Medicine | Admitting: Emergency Medicine

## 2013-09-19 DIAGNOSIS — R079 Chest pain, unspecified: Secondary | ICD-10-CM | POA: Insufficient documentation

## 2013-09-19 DIAGNOSIS — H579 Unspecified disorder of eye and adnexa: Secondary | ICD-10-CM | POA: Insufficient documentation

## 2013-09-19 DIAGNOSIS — F17200 Nicotine dependence, unspecified, uncomplicated: Secondary | ICD-10-CM | POA: Insufficient documentation

## 2013-09-19 DIAGNOSIS — R51 Headache: Secondary | ICD-10-CM | POA: Insufficient documentation

## 2013-09-19 DIAGNOSIS — G8911 Acute pain due to trauma: Secondary | ICD-10-CM | POA: Insufficient documentation

## 2013-09-19 DIAGNOSIS — J45909 Unspecified asthma, uncomplicated: Secondary | ICD-10-CM | POA: Insufficient documentation
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
## 2013-09-20 ENCOUNTER — Ambulatory Visit (INDEPENDENT_AMBULATORY_CARE_PROVIDER_SITE_OTHER): Payer: Medicaid Other | Admitting: Obstetrics & Gynecology

## 2013-09-20 ENCOUNTER — Encounter: Payer: Self-pay | Admitting: Obstetrics & Gynecology

## 2013-09-20 VITALS — BP 100/60 | Wt 135.0 lb

## 2013-09-20 DIAGNOSIS — N7011 Chronic salpingitis: Secondary | ICD-10-CM

## 2013-09-20 DIAGNOSIS — IMO0002 Reserved for concepts with insufficient information to code with codable children: Secondary | ICD-10-CM

## 2013-09-20 DIAGNOSIS — N7013 Chronic salpingitis and oophoritis: Secondary | ICD-10-CM

## 2013-09-20 DIAGNOSIS — Z01818 Encounter for other preprocedural examination: Secondary | ICD-10-CM

## 2013-09-20 DIAGNOSIS — N730 Acute parametritis and pelvic cellulitis: Secondary | ICD-10-CM

## 2013-09-20 MED ORDER — OXYCODONE-ACETAMINOPHEN 5-325 MG PO TABS: 1.0000 | ORAL_TABLET | ORAL | Status: DC | PRN

## 2013-09-20 MED ORDER — DOXYCYCLINE HYCLATE 50 MG PO CAPS: 100.0000 mg | ORAL_CAPSULE | Freq: Two times a day (BID) | ORAL | Status: DC

## 2013-09-20 NOTE — Progress Notes (Signed)
Patient ID: Katherine Burgess, female   DOB: 08/11/1980, 33 y.o.   MRN: [id number]  Pt was scheduled for surgery several weeks ago but cancelled surgery Wants to reschedule Reschedule for 09/28/2013  rx for percocet # 10

## 2013-09-23 ENCOUNTER — Encounter (HOSPITAL_COMMUNITY)
Admission: RE | Admit: 2013-09-23 | Discharge: 2013-09-23 | Disposition: A | Discharge status: Home / Self Care | Payer: Medicaid Other | Source: Ambulatory Visit | Attending: Obstetrics & Gynecology | Admitting: Obstetrics & Gynecology

## 2013-09-23 ENCOUNTER — Encounter (HOSPITAL_COMMUNITY): Payer: Self-pay

## 2013-09-23 ENCOUNTER — Encounter (HOSPITAL_COMMUNITY): Payer: Self-pay | Admitting: Pharmacy Technician

## 2013-09-28 ENCOUNTER — Encounter (HOSPITAL_COMMUNITY)
Admission: RE | Disposition: A | Discharge status: Home / Self Care | Payer: Self-pay | Source: Ambulatory Visit | Attending: Obstetrics & Gynecology

## 2013-09-28 ENCOUNTER — Encounter (HOSPITAL_COMMUNITY): Payer: Self-pay | Admitting: *Deleted

## 2013-09-28 ENCOUNTER — Encounter (HOSPITAL_COMMUNITY): Payer: Medicaid Other | Admitting: Anesthesiology

## 2013-09-28 ENCOUNTER — Ambulatory Visit (HOSPITAL_COMMUNITY): Payer: Medicaid Other | Admitting: Anesthesiology

## 2013-09-28 ENCOUNTER — Ambulatory Visit (HOSPITAL_COMMUNITY)
Admission: RE | Admit: 2013-09-28 | Discharge: 2013-09-28 | Disposition: A | Discharge status: Home / Self Care | Payer: Medicaid Other | Source: Ambulatory Visit | Attending: Obstetrics & Gynecology | Admitting: Obstetrics & Gynecology

## 2013-09-28 DIAGNOSIS — Z9889 Other specified postprocedural states: Secondary | ICD-10-CM

## 2013-09-28 DIAGNOSIS — N7093 Salpingitis and oophoritis, unspecified: Secondary | ICD-10-CM | POA: Insufficient documentation

## 2013-09-28 DIAGNOSIS — F411 Generalized anxiety disorder: Secondary | ICD-10-CM | POA: Insufficient documentation

## 2013-09-28 DIAGNOSIS — N736 Female pelvic peritoneal adhesions (postinfective): Secondary | ICD-10-CM | POA: Insufficient documentation

## 2013-09-28 DIAGNOSIS — F319 Bipolar disorder, unspecified: Secondary | ICD-10-CM | POA: Insufficient documentation

## 2013-09-28 DIAGNOSIS — N949 Unspecified condition associated with female genital organs and menstrual cycle: Secondary | ICD-10-CM | POA: Insufficient documentation

## 2013-09-28 DIAGNOSIS — F17200 Nicotine dependence, unspecified, uncomplicated: Secondary | ICD-10-CM | POA: Insufficient documentation

## 2013-09-28 DIAGNOSIS — IMO0002 Reserved for concepts with insufficient information to code with codable children: Secondary | ICD-10-CM

## 2013-09-28 DIAGNOSIS — G8929 Other chronic pain: Secondary | ICD-10-CM | POA: Insufficient documentation

## 2013-09-28 DIAGNOSIS — F4310 Post-traumatic stress disorder, unspecified: Secondary | ICD-10-CM | POA: Insufficient documentation

## 2013-09-28 DIAGNOSIS — N7013 Chronic salpingitis and oophoritis: Secondary | ICD-10-CM

## 2013-09-28 DIAGNOSIS — R1031 Right lower quadrant pain: Secondary | ICD-10-CM

## 2013-09-28 HISTORY — PX: LAPAROSCOPIC UNILATERAL SALPINGECTOMY: SHX5934

## 2013-09-28 HISTORY — PX: LAPAROSCOPIC LYSIS OF ADHESIONS: SHX5905

## 2013-09-28 LAB — HCG, SERUM, QUALITATIVE: Preg, Serum: NEGATIVE

## 2013-09-28 SURGERY — SALPINGECTOMY, UNILATERAL, LAPAROSCOPIC
Anesthesia: General | Site: Abdomen

## 2013-09-28 MED ORDER — KETOROLAC TROMETHAMINE 30 MG/ML IJ SOLN
30.0000 mg | Freq: Once | INTRAMUSCULAR | Status: AC
  Administered 2013-09-28: 30 mg via INTRAVENOUS

## 2013-09-28 MED ORDER — FENTANYL CITRATE 0.05 MG/ML IJ SOLN
INTRAMUSCULAR | Status: AC
  Filled 2013-09-28: qty 5

## 2013-09-28 MED ORDER — SODIUM CHLORIDE 0.9 % IN NEBU
INHALATION_SOLUTION | RESPIRATORY_TRACT | Status: AC
  Filled 2013-09-28: qty 3

## 2013-09-28 MED ORDER — ALBUTEROL SULFATE (2.5 MG/3ML) 0.083% IN NEBU
INHALATION_SOLUTION | RESPIRATORY_TRACT | Status: AC
  Filled 2013-09-28: qty 3

## 2013-09-28 MED ORDER — ROCURONIUM BROMIDE 50 MG/5ML IV SOLN
INTRAVENOUS | Status: AC
  Filled 2013-09-28: qty 1

## 2013-09-28 MED ORDER — KETOROLAC TROMETHAMINE 10 MG PO TABS: 10.0000 mg | ORAL_TABLET | Freq: Three times a day (TID) | ORAL | Status: DC | PRN

## 2013-09-28 MED ORDER — GLYCOPYRROLATE 0.2 MG/ML IJ SOLN
INTRAMUSCULAR | Status: AC
  Filled 2013-09-28: qty 3

## 2013-09-28 MED ORDER — FENTANYL CITRATE 0.05 MG/ML IJ SOLN
INTRAMUSCULAR | Status: AC
  Filled 2013-09-28: qty 2

## 2013-09-28 MED ORDER — FENTANYL CITRATE 0.05 MG/ML IJ SOLN: 25.0000 ug | INTRAMUSCULAR | Status: DC | PRN

## 2013-09-28 MED ORDER — MIDAZOLAM HCL 2 MG/2ML IJ SOLN
INTRAMUSCULAR | Status: AC
  Filled 2013-09-28: qty 2

## 2013-09-28 MED ORDER — LACTATED RINGERS IV SOLN
INTRAVENOUS | Status: DC | PRN
  Administered 2013-09-28 (×2): via INTRAVENOUS

## 2013-09-28 MED ORDER — LIDOCAINE HCL (CARDIAC) 20 MG/ML IV SOLN
INTRAVENOUS | Status: DC | PRN
  Administered 2013-09-28: 50 mg via INTRAVENOUS

## 2013-09-28 MED ORDER — SODIUM CHLORIDE 0.9 % IJ SOLN
INTRAMUSCULAR | Status: AC
  Filled 2013-09-28: qty 10

## 2013-09-28 MED ORDER — LACTATED RINGERS IV SOLN
INTRAVENOUS | Status: DC
  Administered 2013-09-28: 09:00:00 via INTRAVENOUS

## 2013-09-28 MED ORDER — GLYCOPYRROLATE 0.2 MG/ML IJ SOLN
INTRAMUSCULAR | Status: DC | PRN
  Administered 2013-09-28: 0.6 mg via INTRAVENOUS

## 2013-09-28 MED ORDER — METHYLENE BLUE 1 % INJ SOLN
INTRAMUSCULAR | Status: AC
  Filled 2013-09-28: qty 1

## 2013-09-28 MED ORDER — ONDANSETRON HCL 4 MG/2ML IJ SOLN
INTRAMUSCULAR | Status: AC
  Filled 2013-09-28: qty 2

## 2013-09-28 MED ORDER — GLYCOPYRROLATE 0.2 MG/ML IJ SOLN
0.2000 mg | Freq: Once | INTRAMUSCULAR | Status: AC
  Administered 2013-09-28: 0.2 mg via INTRAVENOUS

## 2013-09-28 MED ORDER — ROCURONIUM BROMIDE 100 MG/10ML IV SOLN
INTRAVENOUS | Status: DC | PRN
Start: 2013-09-28 — End: 2013-09-28
  Administered 2013-09-28 (×3): 10 mg via INTRAVENOUS

## 2013-09-28 MED ORDER — SODIUM CHLORIDE 0.9 % IJ SOLN
INTRAMUSCULAR | Status: AC
  Filled 2013-09-28: qty 40

## 2013-09-28 MED ORDER — SODIUM CHLORIDE 0.9 % IJ SOLN
INTRAMUSCULAR | Status: AC
  Filled 2013-09-28: qty 20

## 2013-09-28 MED ORDER — SUCCINYLCHOLINE CHLORIDE 20 MG/ML IJ SOLN
INTRAMUSCULAR | Status: DC | PRN
  Administered 2013-09-28: 100 mg via INTRAVENOUS

## 2013-09-28 MED ORDER — NEOSTIGMINE METHYLSULFATE 1 MG/ML IJ SOLN
INTRAMUSCULAR | Status: DC | PRN
  Administered 2013-09-28: 4 mg via INTRAVENOUS

## 2013-09-28 MED ORDER — ALBUTEROL SULFATE (2.5 MG/3ML) 0.083% IN NEBU
2.5000 mg | INHALATION_SOLUTION | Freq: Once | RESPIRATORY_TRACT | Status: AC
  Administered 2013-09-28: 2.5 mg via RESPIRATORY_TRACT

## 2013-09-28 MED ORDER — METHYLENE BLUE 1 % INJ SOLN
INTRAMUSCULAR | Status: AC
  Filled 2013-09-28: qty 2

## 2013-09-28 MED ORDER — FENTANYL CITRATE 0.05 MG/ML IJ SOLN
INTRAMUSCULAR | Status: DC | PRN
  Administered 2013-09-28 (×2): 50 ug via INTRAVENOUS
  Administered 2013-09-28: 100 ug via INTRAVENOUS
  Administered 2013-09-28 (×4): 50 ug via INTRAVENOUS
  Administered 2013-09-28: 100 ug via INTRAVENOUS

## 2013-09-28 MED ORDER — CEFAZOLIN SODIUM-DEXTROSE 2-3 GM-% IV SOLR
INTRAVENOUS | Status: AC
  Filled 2013-09-28: qty 50

## 2013-09-28 MED ORDER — ONDANSETRON HCL 4 MG/2ML IJ SOLN
4.0000 mg | Freq: Once | INTRAMUSCULAR | Status: AC
  Administered 2013-09-28: 4 mg via INTRAVENOUS

## 2013-09-28 MED ORDER — KETOROLAC TROMETHAMINE 30 MG/ML IJ SOLN
INTRAMUSCULAR | Status: AC
  Filled 2013-09-28: qty 1

## 2013-09-28 MED ORDER — GLYCOPYRROLATE 0.2 MG/ML IJ SOLN
INTRAMUSCULAR | Status: AC
  Filled 2013-09-28: qty 1

## 2013-09-28 MED ORDER — 0.9 % SODIUM CHLORIDE (POUR BTL) OPTIME
TOPICAL | Status: DC | PRN
  Administered 2013-09-28: 1000 mL

## 2013-09-28 MED ORDER — METHYLENE BLUE 1 % INJ SOLN
INTRAMUSCULAR | Status: DC | PRN
  Administered 2013-09-28: 11 mL via SUBMUCOSAL

## 2013-09-28 MED ORDER — BUPIVACAINE LIPOSOME 1.3 % IJ SUSP
20.0000 mL | Freq: Once | INTRAMUSCULAR | Status: DC
  Filled 2013-09-28: qty 20

## 2013-09-28 MED ORDER — CEFAZOLIN SODIUM-DEXTROSE 2-3 GM-% IV SOLR
2.0000 g | INTRAVENOUS | Status: AC
  Administered 2013-09-28: 2 g via INTRAVENOUS

## 2013-09-28 MED ORDER — PROPOFOL 10 MG/ML IV BOLUS
INTRAVENOUS | Status: DC | PRN
  Administered 2013-09-28: 150 mg via INTRAVENOUS

## 2013-09-28 MED ORDER — LIDOCAINE HCL (PF) 1 % IJ SOLN
INTRAMUSCULAR | Status: AC
  Filled 2013-09-28: qty 5

## 2013-09-28 MED ORDER — ONDANSETRON HCL 8 MG PO TABS: 8.0000 mg | ORAL_TABLET | Freq: Three times a day (TID) | ORAL | Status: DC | PRN

## 2013-09-28 MED ORDER — THROMBIN 5000 UNITS EX SOLR
CUTANEOUS | Status: AC
  Filled 2013-09-28: qty 5000

## 2013-09-28 MED ORDER — ONDANSETRON HCL 4 MG/2ML IJ SOLN: 4.0000 mg | Freq: Once | INTRAMUSCULAR | Status: DC | PRN

## 2013-09-28 MED ORDER — PROPOFOL 10 MG/ML IV BOLUS
INTRAVENOUS | Status: AC
  Filled 2013-09-28: qty 20

## 2013-09-28 MED ORDER — OXYCODONE-ACETAMINOPHEN 7.5-325 MG PO TABS: 1.0000 | ORAL_TABLET | Freq: Four times a day (QID) | ORAL | Status: DC | PRN

## 2013-09-28 MED ORDER — BUPIVACAINE LIPOSOME 1.3 % IJ SUSP
INTRAMUSCULAR | Status: DC | PRN
  Administered 2013-09-28: 20 mL

## 2013-09-28 MED ORDER — MIDAZOLAM HCL 2 MG/2ML IJ SOLN
1.0000 mg | INTRAMUSCULAR | Status: DC | PRN
  Administered 2013-09-28 (×2): 2 mg via INTRAVENOUS

## 2013-09-28 SURGICAL SUPPLY — 48 items
APPLIER CLIP UNV 5X34 EPIX (ENDOMECHANICALS) ×3 IMPLANT
APR XCLPCLP 20M/L UNV 34X5 (ENDOMECHANICALS) ×1
BAG HAMPER (MISCELLANEOUS) ×3 IMPLANT
BAG SPEC RTRVL LRG 6X4 10 (ENDOMECHANICALS) ×1
BLADE SURG SZ11 CARB STEEL (BLADE) ×3 IMPLANT
CLOTH BEACON ORANGE TIMEOUT ST (SAFETY) ×3 IMPLANT
COVER LIGHT HANDLE STERIS (MISCELLANEOUS) ×6 IMPLANT
ELECT REM PT RETURN 9FT ADLT (ELECTROSURGICAL) ×3
ELECTRODE REM PT RTRN 9FT ADLT (ELECTROSURGICAL) ×1 IMPLANT
FILTER SMOKE EVAC LAPAROSHD (FILTER) ×3 IMPLANT
FORMALIN 10 PREFIL 480ML (MISCELLANEOUS) ×3 IMPLANT
GLOVE BIOGEL PI IND STRL 7.0 (GLOVE) IMPLANT
GLOVE BIOGEL PI IND STRL 7.5 (GLOVE) IMPLANT
GLOVE BIOGEL PI IND STRL 8 (GLOVE) ×1 IMPLANT
GLOVE BIOGEL PI INDICATOR 7.0 (GLOVE) ×4
GLOVE BIOGEL PI INDICATOR 7.5 (GLOVE) ×2
GLOVE BIOGEL PI INDICATOR 8 (GLOVE) ×2
GLOVE ECLIPSE 7.0 STRL STRAW (GLOVE) ×4 IMPLANT
GLOVE ECLIPSE 8.0 STRL XLNG CF (GLOVE) ×3 IMPLANT
GLOVE OPTIFIT SS 6.5 STRL BRWN (GLOVE) ×2 IMPLANT
GOWN STRL REUS W/TWL LRG LVL3 (GOWN DISPOSABLE) ×6 IMPLANT
GOWN STRL REUS W/TWL XL LVL3 (GOWN DISPOSABLE) ×3 IMPLANT
INST SET LAPROSCOPIC GYN AP (KITS) ×3 IMPLANT
IV NS IRRIG 3000ML ARTHROMATIC (IV SOLUTION) ×3 IMPLANT
KIT ROOM TURNOVER APOR (KITS) ×3 IMPLANT
KIT TROCAR LAP GYN (TROCAR) ×3 IMPLANT
MANIFOLD NEPTUNE II (INSTRUMENTS) ×3 IMPLANT
NDL HYPO 18GX1.5 BLUNT FILL (NEEDLE) IMPLANT
NDL HYPO 25X1 1.5 SAFETY (NEEDLE) IMPLANT
NEEDLE HYPO 18GX1.5 BLUNT FILL (NEEDLE) ×3 IMPLANT
NEEDLE HYPO 25X1 1.5 SAFETY (NEEDLE) ×3 IMPLANT
PACK PERI GYN (CUSTOM PROCEDURE TRAY) ×3 IMPLANT
PAD ARMBOARD 7.5X6 YLW CONV (MISCELLANEOUS) ×3 IMPLANT
POUCH SPECIMEN RETRIEVAL 10MM (ENDOMECHANICALS) ×2 IMPLANT
SCALPEL HARMONIC ACE (MISCELLANEOUS) ×3 IMPLANT
SET BASIN LINEN APH (SET/KITS/TRAYS/PACK) ×3 IMPLANT
SET TUBE IRRIG SUCTION NO TIP (IRRIGATION / IRRIGATOR) ×2 IMPLANT
SOLUTION ANTI FOG 6CC (MISCELLANEOUS) ×3 IMPLANT
SPONGE GAUZE 4X4 12PLY STER LF (GAUZE/BANDAGES/DRESSINGS) ×2 IMPLANT
STAPLER VISISTAT 35W (STAPLE) ×3 IMPLANT
SUT VICRYL 0 UR6 27IN ABS (SUTURE) ×3 IMPLANT
SYR 20CC LL (SYRINGE) ×2 IMPLANT
SYR 30ML LL (SYRINGE) ×2 IMPLANT
SYR 50ML LL SCALE MARK (SYRINGE) ×2 IMPLANT
TAPE CLOTH SURG 4X10 WHT LF (GAUZE/BANDAGES/DRESSINGS) ×2 IMPLANT
TRAY FOLEY CATH 16FR SILVER (SET/KITS/TRAYS/PACK) ×3 IMPLANT
TUBING HI FLO HEAT INSUFFLATOR (IRRIGATION / IRRIGATOR) ×3 IMPLANT
WARMER LAPAROSCOPE (MISCELLANEOUS) ×3 IMPLANT

## 2013-09-28 NOTE — Progress Notes (Signed)
Arousing. Oriented to place per nurse. Oral airway d/c'd. resp adequate/nonlabored. O2 continued via nasal cannula at 3 l/m. Tolerated well. Denies pain.

## 2013-09-28 NOTE — Anesthesia Procedure Notes (Signed)
Procedure Name: Intubation Date/Time: 09/28/2013 9:24 AM Performed by: Pernell DupreADAMS, AMY A Pre-anesthesia Checklist: Patient identified, Patient being monitored, Timeout performed, Emergency Drugs available and Suction available Patient Re-evaluated:Patient Re-evaluated prior to inductionOxygen Delivery Method: Circle System Utilized Preoxygenation: Pre-oxygenation with 100% oxygen Intubation Type: IV induction, Rapid sequence and Cricoid Pressure applied Ventilation: Mask ventilation without difficulty Grade View: Grade I Tube type: Oral Tube size: 7.0 mm Number of attempts: 1 Airway Equipment and Method: stylet and Video-laryngoscopy Placement Confirmation: ETT inserted through vocal cords under direct vision,  positive ETCO2 and breath sounds checked- equal and bilateral Secured at: 21 cm Tube secured with: Tape Dental Injury: Teeth and Oropharynx as per pre-operative assessment

## 2013-09-28 NOTE — Anesthesia Postprocedure Evaluation (Signed)
  Anesthesia Post-op Note  Patient: Katherine Burgess  Procedure(s) Performed: Procedure(s) with comments: LAPAROSCOPIC RIGHT SALPINGECTOMY; INSTILLATION OF DYE INTO LEFT FALLOPIAN TUBE (N/A) LAPAROSCOPIC LYSIS OF ADHESIONS (N/A) - Extensive Lysis of Adhesions, Left Fallopian Tube Fimbrioplasty  Patient Location: PACU  Anesthesia Type:General  Level of Consciousness: sedated and patient cooperative  Airway and Oxygen Therapy: Patient Spontanous Breathing and Patient connected to face mask oxygen  Post-op Pain: none  Post-op Assessment: Post-op Vital signs reviewed, Patient's Cardiovascular Status Stable, Respiratory Function Stable, Patent Airway, No signs of Nausea or vomiting and Pain level controlled  Post-op Vital Signs: Reviewed and stable  Complications: No apparent anesthesia complications

## 2013-09-28 NOTE — Transfer of Care (Signed)
Immediate Anesthesia Transfer of Care Note  Patient: Katherine Burgess  Procedure(s) Performed: Procedure(s) with comments: LAPAROSCOPIC RIGHT SALPINGECTOMY; INSTILLATION OF DYE INTO LEFT FALLOPIAN TUBE (N/A) LAPAROSCOPIC LYSIS OF ADHESIONS (N/A) - Extensive Lysis of Adhesions, Left Fallopian Tube Fimbrioplasty  Patient Location: PACU  Anesthesia Type:General  Level of Consciousness: sedated and patient cooperative  Airway & Oxygen Therapy: Patient Spontanous Breathing and Patient connected to face mask oxygen  Post-op Assessment: Report given to PACU RN and Post -op Vital signs reviewed and stable  Post vital signs: Reviewed and stable  Complications: No apparent anesthesia complications
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
## 2013-09-28 NOTE — Preoperative (Signed)
Beta Blockers   Reason not to administer Beta Blockers:Not Applicable 

## 2013-09-28 NOTE — Anesthesia Preprocedure Evaluation (Addendum)
Anesthesia Evaluation  Patient identified by MRN, date of birth, ID band Patient awake    Reviewed: Allergy & Precautions, H&P , NPO status , Patient's Chart, lab work & pertinent test results  Airway Mallampati: II TM Distance: >3 FB Neck ROM: Full  Mouth opening: Limited Mouth Opening Comment: Hx mandibular Fx with limited opening  Dental  (+) Teeth Intact   Pulmonary asthma , Current Smoker,  breath sounds clear to auscultation        Cardiovascular Rhythm:Regular Rate:Normal     Neuro/Psych PSYCHIATRIC DISORDERS Anxiety Depression Bipolar Disorder    GI/Hepatic   Endo/Other    Renal/GU Renal disease     Musculoskeletal   Abdominal   Peds  Hematology   Anesthesia Other Findings   Reproductive/Obstetrics                          Anesthesia Physical Anesthesia Plan  ASA: III  Anesthesia Plan: General   Post-op Pain Management:    Induction: Intravenous and Rapid sequence  Airway Management Planned: Video Laryngoscope Planned and Oral ETT  Additional Equipment:   Intra-op Plan:   Post-operative Plan: Extubation in OR  Informed Consent: I have reviewed the patients History and Physical, chart, labs and discussed the procedure including the risks, benefits and alternatives for the proposed anesthesia with the patient or authorized representative who has indicated his/her understanding and acceptance.     Plan Discussed with:   Anesthesia Plan Comments:         Anesthesia Quick Evaluation

## 2013-09-29 ENCOUNTER — Encounter (HOSPITAL_COMMUNITY): Payer: Self-pay | Admitting: Obstetrics & Gynecology

## 2013-09-30 ENCOUNTER — Telehealth: Payer: Self-pay

## 2013-09-30 MED ORDER — HYDROMORPHONE HCL 2 MG PO TABS: 2.0000 mg | ORAL_TABLET | ORAL | Status: DC | PRN

## 2013-09-30 NOTE — Telephone Encounter (Signed)
Pt aware to come pick up Dilaudid 2 mg Rx. JSY

## 2013-09-30 NOTE — Telephone Encounter (Signed)
Spoke with pt. Pt had surgery on 09/28/13 and is in a lot of pain. Pt is on Percocet 7.5/325 and is having to take 3 tabs at a time to get any relief. She is requesting a stronger pain med. Also, pt is requesting an Rx to get bandages to cover up incision. Pt states she can't afford to pay out of pocket for these. Pt was crying over the phone in pain. Please advise. Thanks!!!

## 2013-10-01 ENCOUNTER — Emergency Department: Payer: Self-pay | Admitting: Emergency Medicine

## 2013-10-04 ENCOUNTER — Ambulatory Visit (INDEPENDENT_AMBULATORY_CARE_PROVIDER_SITE_OTHER): Payer: Medicaid Other | Admitting: Obstetrics & Gynecology

## 2013-10-04 ENCOUNTER — Encounter: Payer: Self-pay | Admitting: Obstetrics & Gynecology

## 2013-10-04 VITALS — BP 120/80 | Wt 140.0 lb

## 2013-10-04 DIAGNOSIS — IMO0002 Reserved for concepts with insufficient information to code with codable children: Secondary | ICD-10-CM

## 2013-10-04 DIAGNOSIS — N7013 Chronic salpingitis and oophoritis: Secondary | ICD-10-CM

## 2013-10-04 DIAGNOSIS — Z9889 Other specified postprocedural states: Secondary | ICD-10-CM

## 2013-10-04 DIAGNOSIS — N7011 Chronic salpingitis: Secondary | ICD-10-CM

## 2013-10-04 DIAGNOSIS — N730 Acute parametritis and pelvic cellulitis: Secondary | ICD-10-CM

## 2013-10-04 MED ORDER — OXYCODONE-ACETAMINOPHEN 7.5-325 MG PO TABS: 1.0000 | ORAL_TABLET | Freq: Four times a day (QID) | ORAL | Status: DC | PRN
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
## 2013-10-05 ENCOUNTER — Encounter: Payer: Medicaid Other | Admitting: Obstetrics & Gynecology

## 2013-11-21 ENCOUNTER — Emergency Department (HOSPITAL_COMMUNITY)
Admission: EM | Admit: 2013-11-21 | Discharge: 2013-11-22 | Disposition: A | Discharge status: Home / Self Care | Payer: Medicaid Other | Attending: Emergency Medicine | Admitting: Emergency Medicine

## 2013-11-21 ENCOUNTER — Encounter (HOSPITAL_COMMUNITY): Payer: Self-pay | Admitting: Emergency Medicine

## 2013-11-21 DIAGNOSIS — N949 Unspecified condition associated with female genital organs and menstrual cycle: Secondary | ICD-10-CM | POA: Insufficient documentation

## 2013-11-21 DIAGNOSIS — R102 Pelvic and perineal pain: Secondary | ICD-10-CM

## 2013-11-21 DIAGNOSIS — F17200 Nicotine dependence, unspecified, uncomplicated: Secondary | ICD-10-CM | POA: Insufficient documentation

## 2013-11-21 DIAGNOSIS — Z87448 Personal history of other diseases of urinary system: Secondary | ICD-10-CM | POA: Insufficient documentation

## 2013-11-21 DIAGNOSIS — Z202 Contact with and (suspected) exposure to infections with a predominantly sexual mode of transmission: Secondary | ICD-10-CM

## 2013-11-21 DIAGNOSIS — Z792 Long term (current) use of antibiotics: Secondary | ICD-10-CM | POA: Insufficient documentation

## 2013-11-21 DIAGNOSIS — G8929 Other chronic pain: Secondary | ICD-10-CM | POA: Insufficient documentation

## 2013-11-21 DIAGNOSIS — Z9889 Other specified postprocedural states: Secondary | ICD-10-CM | POA: Insufficient documentation

## 2013-11-21 DIAGNOSIS — Z87442 Personal history of urinary calculi: Secondary | ICD-10-CM | POA: Insufficient documentation

## 2013-11-21 DIAGNOSIS — J45909 Unspecified asthma, uncomplicated: Secondary | ICD-10-CM | POA: Insufficient documentation

## 2013-11-21 DIAGNOSIS — F319 Bipolar disorder, unspecified: Secondary | ICD-10-CM | POA: Insufficient documentation

## 2013-11-21 DIAGNOSIS — F411 Generalized anxiety disorder: Secondary | ICD-10-CM | POA: Insufficient documentation

## 2013-11-21 DIAGNOSIS — Z79899 Other long term (current) drug therapy: Secondary | ICD-10-CM | POA: Insufficient documentation

## 2013-11-21 NOTE — ED Notes (Signed)
Pt states she had sex yesterday and does not remember removing tampon. Pt want std tests and hiv test.

## 2013-11-22 LAB — WET PREP, GENITAL
Trich, Wet Prep: NONE SEEN
YEAST WET PREP: NONE SEEN

## 2013-11-22 LAB — RPR

## 2013-11-22 LAB — HIV ANTIBODY (ROUTINE TESTING W REFLEX): HIV 1&2 Ab, 4th Generation: NONREACTIVE

## 2013-11-22 MED ORDER — PROMETHAZINE HCL 25 MG RE SUPP: 25.0000 mg | Freq: Four times a day (QID) | RECTAL | Status: DC | PRN

## 2013-11-22 MED ORDER — CEFTRIAXONE SODIUM 250 MG IJ SOLR
250.0000 mg | Freq: Once | INTRAMUSCULAR | Status: AC
  Administered 2013-11-22: 250 mg via INTRAMUSCULAR
  Filled 2013-11-22: qty 250

## 2013-11-22 MED ORDER — PROMETHAZINE HCL 12.5 MG PO TABS
25.0000 mg | ORAL_TABLET | Freq: Once | ORAL | Status: AC
Start: 2013-11-22 — End: 2013-11-22
  Administered 2013-11-22: 25 mg via ORAL
  Filled 2013-11-22: qty 2

## 2013-11-22 MED ORDER — LIDOCAINE HCL (PF) 1 % IJ SOLN
INTRAMUSCULAR | Status: AC
  Administered 2013-11-22: 5 mL
  Filled 2013-11-22: qty 5

## 2013-11-22 MED ORDER — IBUPROFEN 800 MG PO TABS
800.0000 mg | ORAL_TABLET | Freq: Once | ORAL | Status: AC
  Administered 2013-11-22: 800 mg via ORAL
  Filled 2013-11-22: qty 1

## 2013-11-22 MED ORDER — IBUPROFEN 800 MG PO TABS: 800.0000 mg | ORAL_TABLET | Freq: Three times a day (TID) | ORAL | Status: DC

## 2013-11-22 MED ORDER — ACETAMINOPHEN-CODEINE #3 300-30 MG PO TABS: 1.0000 | ORAL_TABLET | Freq: Four times a day (QID) | ORAL | Status: DC | PRN

## 2013-11-22 MED ORDER — ACETAMINOPHEN-CODEINE #3 300-30 MG PO TABS
2.0000 | ORAL_TABLET | Freq: Once | ORAL | Status: AC
  Administered 2013-11-22: 2 via ORAL
  Filled 2013-11-22: qty 2

## 2013-11-22 MED ORDER — AZITHROMYCIN 250 MG PO TABS
1000.0000 mg | ORAL_TABLET | Freq: Once | ORAL | Status: AC
  Administered 2013-11-22: 1000 mg via ORAL
  Filled 2013-11-22: qty 4

## 2013-11-22 NOTE — Discharge Instructions (Signed)
No foreign body found in the vagina. Use ibuprofen and tylenol codeine for pain. See Dr Wylene SimmerMand for additonal follow up and management of this problem.

## 2013-11-22 NOTE — ED Provider Notes (Signed)
Medical screening examination/treatment/procedure(s) were performed by non-physician practitioner and as supervising physician I was immediately available for consultation/collaboration.   EKG Interpretation None        Benny LennertJoseph L Srijan Givan, MD 11/22/13 [phone]

## 2013-11-22 NOTE — ED Notes (Signed)
Patient placed tampon in after examination. Patient asking for Mal[place]key sandwich, chips and sprite at this time. Patient states that she has not eaten in the last 24 hours. Offered peanut butter and graham crackers. Refused stating that she has not eaten in the last 24 hours and she needs real food and that she is allergic to nut products.
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
## 2013-11-23 LAB — GC/CHLAMYDIA PROBE AMP
CT Probe RNA: NEGATIVE
GC Probe RNA: NEGATIVE

## 2013-12-05 NOTE — Op Note (Signed)
Preoperative diagnosis:  Chronic right tubo-ovarian abscess                                          Chronic pelvic pain in the right                                          Desires fertility  Postoperative diagnosis: Right tubal abscess, severe pelvic and abdominal adhesive disease, distally blocked left fallopian tube  Procedure:  Laparoscopic right salpingectomy                     Laparoscopic lysis of adhesions                     Laparoscopic left fimbrioplasty                     Laparoscopic chromotubation  Surgeon:  Lazaro Arms    Anesthesia:  Gen. 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  As was confirmed at surgery  additionally she had distal blockage of the left fallopian tube and it was adherent to the left pelvic sidewall.  The ovary was also adherent to the right pelvic sidewall.  Again there were severe adhesions throughout the pelvis secondary to old infectious process  Description of operation: Patient was taken to the operating room and placed in the supine position where she underwent general tracheal anesthesia.  She was placed in low lithotomy position and prepped and draped in usual sterile fashion.  Installation tenaculum was placed for chromotubation.  Vision was made in the umbilicus.  A varies needle was used and the peritoneal cavity was insufflated.  Under direct visualization a 11 mm trocar was placed into the peritoneal cavity without difficulty.  Additionally 5 mm trochars were placed in the right and left lower quadrants under direct visualization without difficulty.  Of noted findings were seen as stated above.  The right fallopian tube was a pyosalpinx and the harmonic scalpel was used and a salpingectomy was performed.  The right ovary was adherent to the right pelvic sidewall and it was liberated from its adhesion.  Adhesio  lysis was performed.  The left fallopian tube was obviously blocked with a distal adhesion.  A fimbrioplasty was performed using the harmonic scalpel and methylene blue was injected and spilled out at the end of the tube.  We have my concerns going for about the internal performance of the tube but it was made patent.  He ovary and tube were appearing to the posterior uterine wall as well as the pelvic sidewall and they were liberated from the adhesions.  All the pedicles were hemostatic.  The instrument were removed from the peritoneal cavity.  Gas was allowed to escape.  The right fallopian tube was sent to pathology for evaluation.  The umbilical fascia was closed and all skin incisions were closed using staples.  She received Ancef and Toradol preoperatively.  SHe was taken to recovery in good stable condition all counts were correct x3  Lazaro Arms

## 2014-01-02 ENCOUNTER — Emergency Department (HOSPITAL_COMMUNITY)
Admission: EM | Admit: 2014-01-02 | Discharge: 2014-01-02 | Discharge status: Against Medical Advice | Payer: Medicaid Other

## 2014-01-08 ENCOUNTER — Emergency Department (HOSPITAL_COMMUNITY)
Admission: EM | Admit: 2014-01-08 | Discharge: 2014-01-08 | Discharge status: Against Medical Advice | Payer: Medicaid Other | Attending: Emergency Medicine | Admitting: Emergency Medicine

## 2014-01-08 ENCOUNTER — Encounter (HOSPITAL_COMMUNITY): Payer: Self-pay | Admitting: Emergency Medicine

## 2014-01-08 DIAGNOSIS — F17200 Nicotine dependence, unspecified, uncomplicated: Secondary | ICD-10-CM | POA: Insufficient documentation

## 2014-01-08 DIAGNOSIS — S79919A Unspecified injury of unspecified hip, initial encounter: Secondary | ICD-10-CM | POA: Insufficient documentation

## 2014-01-08 DIAGNOSIS — F1110 Opioid abuse, uncomplicated: Secondary | ICD-10-CM

## 2014-01-08 DIAGNOSIS — F1210 Cannabis abuse, uncomplicated: Secondary | ICD-10-CM | POA: Diagnosis not present

## 2014-01-08 DIAGNOSIS — S79929A Unspecified injury of unspecified thigh, initial encounter: Secondary | ICD-10-CM

## 2014-01-08 DIAGNOSIS — S0993XA Unspecified injury of face, initial encounter: Secondary | ICD-10-CM | POA: Insufficient documentation

## 2014-01-08 DIAGNOSIS — Z87448 Personal history of other diseases of urinary system: Secondary | ICD-10-CM | POA: Insufficient documentation

## 2014-01-08 DIAGNOSIS — F329 Major depressive disorder, single episode, unspecified: Secondary | ICD-10-CM | POA: Insufficient documentation

## 2014-01-08 DIAGNOSIS — Y9389 Activity, other specified: Secondary | ICD-10-CM | POA: Insufficient documentation

## 2014-01-08 DIAGNOSIS — Z3202 Encounter for pregnancy test, result negative: Secondary | ICD-10-CM | POA: Diagnosis not present

## 2014-01-08 DIAGNOSIS — G8929 Other chronic pain: Secondary | ICD-10-CM | POA: Diagnosis not present

## 2014-01-08 DIAGNOSIS — Z87442 Personal history of urinary calculi: Secondary | ICD-10-CM | POA: Diagnosis not present

## 2014-01-08 DIAGNOSIS — M549 Dorsalgia, unspecified: Secondary | ICD-10-CM

## 2014-01-08 DIAGNOSIS — F411 Generalized anxiety disorder: Secondary | ICD-10-CM | POA: Diagnosis not present

## 2014-01-08 DIAGNOSIS — Y92410 Unspecified street and highway as the place of occurrence of the external cause: Secondary | ICD-10-CM | POA: Insufficient documentation

## 2014-01-08 DIAGNOSIS — F1410 Cocaine abuse, uncomplicated: Secondary | ICD-10-CM | POA: Insufficient documentation

## 2014-01-08 DIAGNOSIS — F3289 Other specified depressive episodes: Secondary | ICD-10-CM | POA: Insufficient documentation

## 2014-01-08 DIAGNOSIS — J45909 Unspecified asthma, uncomplicated: Secondary | ICD-10-CM | POA: Insufficient documentation

## 2014-01-08 DIAGNOSIS — IMO0002 Reserved for concepts with insufficient information to code with codable children: Secondary | ICD-10-CM | POA: Insufficient documentation

## 2014-01-08 DIAGNOSIS — S0990XA Unspecified injury of head, initial encounter: Secondary | ICD-10-CM | POA: Insufficient documentation

## 2014-01-08 DIAGNOSIS — Z79899 Other long term (current) drug therapy: Secondary | ICD-10-CM | POA: Insufficient documentation

## 2014-01-08 DIAGNOSIS — M542 Cervicalgia: Secondary | ICD-10-CM

## 2014-01-08 DIAGNOSIS — S199XXA Unspecified injury of neck, initial encounter: Principal | ICD-10-CM

## 2014-01-08 LAB — URINALYSIS, ROUTINE W REFLEX MICROSCOPIC
Bilirubin Urine: NEGATIVE
GLUCOSE, UA: NEGATIVE mg/dL
Hgb urine dipstick: NEGATIVE
LEUKOCYTES UA: NEGATIVE
Nitrite: NEGATIVE
PH: 6.5 (ref 5.0–8.0)
Protein, ur: NEGATIVE mg/dL
Specific Gravity, Urine: 1.02 (ref 1.005–1.030)
Urobilinogen, UA: 1 mg/dL (ref 0.0–1.0)

## 2014-01-08 LAB — RAPID URINE DRUG SCREEN, HOSP PERFORMED
AMPHETAMINES: NOT DETECTED
BARBITURATES: NOT DETECTED
Benzodiazepines: POSITIVE — AB
Cocaine: POSITIVE — AB
Opiates: POSITIVE — AB
TETRAHYDROCANNABINOL: POSITIVE — AB

## 2014-01-08 LAB — PREGNANCY, URINE: PREG TEST UR: NEGATIVE

## 2014-01-08 MED ORDER — OXYCODONE-ACETAMINOPHEN 5-325 MG PO TABS
2.0000 | ORAL_TABLET | Freq: Once | ORAL | Status: AC
  Administered 2014-01-08: 2 via ORAL
  Filled 2014-01-08: qty 2

## 2014-01-08 MED ORDER — ONDANSETRON 8 MG PO TBDP
ORAL_TABLET | ORAL | Status: AC
  Filled 2014-01-08: qty 1

## 2014-01-08 MED ORDER — ONDANSETRON 8 MG PO TBDP
8.0000 mg | ORAL_TABLET | Freq: Once | ORAL | Status: AC
  Administered 2014-01-08: 8 mg via ORAL

## 2014-01-08 NOTE — ED Notes (Signed)
Pt asking to speak with MD. RN notified.

## 2014-01-08 NOTE — ED Notes (Signed)
Gave percocet per MD order.  Patient states "I'd rather have a shot. If these don't work, then what?"  Explained to her that Percocet was a strong medication that gave more lasting relief than a shot.
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
## 2014-01-08 NOTE — ED Notes (Signed)
C/O nausea. Okayed Zofran 8mg  ODT for nausea.
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
## 2014-01-08 NOTE — ED Notes (Signed)
PT also states urinary urgency and possible pregnancy.

## 2014-01-08 NOTE — ED Notes (Signed)
Pt and family member came to desk states "i am leaving, I can"t get anything for pain then I am going to another hospital", advised pt to return to her room and i would have dr come to room and talk to her, pt states "I am not going back to my room, I am leaving", pt walked out of er,refusing to wait

## 2014-01-09 ENCOUNTER — Encounter (HOSPITAL_COMMUNITY): Payer: Self-pay | Admitting: Emergency Medicine

## 2014-01-09 ENCOUNTER — Emergency Department (HOSPITAL_COMMUNITY): Payer: Medicaid Other

## 2014-01-09 ENCOUNTER — Emergency Department (HOSPITAL_COMMUNITY)
Admission: EM | Admit: 2014-01-09 | Discharge: 2014-01-09 | Disposition: A | Discharge status: Home / Self Care | Payer: Medicaid Other | Attending: Emergency Medicine | Admitting: Emergency Medicine

## 2014-01-09 DIAGNOSIS — F4310 Post-traumatic stress disorder, unspecified: Secondary | ICD-10-CM | POA: Diagnosis not present

## 2014-01-09 DIAGNOSIS — J45909 Unspecified asthma, uncomplicated: Secondary | ICD-10-CM | POA: Insufficient documentation

## 2014-01-09 DIAGNOSIS — Z79899 Other long term (current) drug therapy: Secondary | ICD-10-CM | POA: Diagnosis not present

## 2014-01-09 DIAGNOSIS — M546 Pain in thoracic spine: Secondary | ICD-10-CM

## 2014-01-09 DIAGNOSIS — Y92410 Unspecified street and highway as the place of occurrence of the external cause: Secondary | ICD-10-CM | POA: Diagnosis not present

## 2014-01-09 DIAGNOSIS — S199XXA Unspecified injury of neck, initial encounter: Secondary | ICD-10-CM | POA: Diagnosis not present

## 2014-01-09 DIAGNOSIS — Z8742 Personal history of other diseases of the female genital tract: Secondary | ICD-10-CM | POA: Insufficient documentation

## 2014-01-09 DIAGNOSIS — Z87442 Personal history of urinary calculi: Secondary | ICD-10-CM | POA: Diagnosis not present

## 2014-01-09 DIAGNOSIS — F17200 Nicotine dependence, unspecified, uncomplicated: Secondary | ICD-10-CM | POA: Diagnosis not present

## 2014-01-09 DIAGNOSIS — G8929 Other chronic pain: Secondary | ICD-10-CM | POA: Diagnosis not present

## 2014-01-09 DIAGNOSIS — IMO0002 Reserved for concepts with insufficient information to code with codable children: Secondary | ICD-10-CM | POA: Diagnosis not present

## 2014-01-09 DIAGNOSIS — F319 Bipolar disorder, unspecified: Secondary | ICD-10-CM | POA: Insufficient documentation

## 2014-01-09 DIAGNOSIS — Y9389 Activity, other specified: Secondary | ICD-10-CM | POA: Diagnosis not present

## 2014-01-09 DIAGNOSIS — S0993XA Unspecified injury of face, initial encounter: Secondary | ICD-10-CM | POA: Diagnosis not present

## 2014-01-09 DIAGNOSIS — F411 Generalized anxiety disorder: Secondary | ICD-10-CM | POA: Diagnosis not present

## 2014-01-09 MED ORDER — KETOROLAC TROMETHAMINE 60 MG/2ML IM SOLN
60.0000 mg | Freq: Once | INTRAMUSCULAR | Status: AC
  Administered 2014-01-09: 60 mg via INTRAMUSCULAR
  Filled 2014-01-09: qty 2

## 2014-01-09 MED ORDER — CYCLOBENZAPRINE HCL 10 MG PO TABS: 10.0000 mg | ORAL_TABLET | Freq: Two times a day (BID) | ORAL | Status: DC | PRN
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
## 2014-01-09 NOTE — ED Notes (Signed)
Dr. Wray at bedside.
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
## 2014-02-28 ENCOUNTER — Encounter (HOSPITAL_COMMUNITY): Payer: Self-pay | Admitting: Emergency Medicine

## 2014-02-28 ENCOUNTER — Emergency Department (HOSPITAL_COMMUNITY)
Admission: EM | Admit: 2014-02-28 | Discharge: 2014-03-01 | Discharge status: Against Medical Advice | Payer: MEDICAID | Attending: Emergency Medicine | Admitting: Emergency Medicine

## 2014-02-28 ENCOUNTER — Emergency Department (HOSPITAL_COMMUNITY): Payer: MEDICAID

## 2014-02-28 DIAGNOSIS — Z91199 Patient's noncompliance with other medical treatment and regimen due to unspecified reason: Secondary | ICD-10-CM | POA: Diagnosis not present

## 2014-02-28 DIAGNOSIS — R51 Headache: Secondary | ICD-10-CM | POA: Diagnosis not present

## 2014-02-28 DIAGNOSIS — J45909 Unspecified asthma, uncomplicated: Secondary | ICD-10-CM | POA: Diagnosis not present

## 2014-02-28 DIAGNOSIS — R059 Cough, unspecified: Secondary | ICD-10-CM | POA: Diagnosis not present

## 2014-02-28 DIAGNOSIS — F32A Depression, unspecified: Secondary | ICD-10-CM

## 2014-02-28 DIAGNOSIS — F319 Bipolar disorder, unspecified: Secondary | ICD-10-CM | POA: Diagnosis not present

## 2014-02-28 DIAGNOSIS — Z9119 Patient's noncompliance with other medical treatment and regimen: Secondary | ICD-10-CM | POA: Diagnosis not present

## 2014-02-28 DIAGNOSIS — R05 Cough: Secondary | ICD-10-CM | POA: Insufficient documentation

## 2014-02-28 DIAGNOSIS — Z87442 Personal history of urinary calculi: Secondary | ICD-10-CM | POA: Diagnosis not present

## 2014-02-28 DIAGNOSIS — F4310 Post-traumatic stress disorder, unspecified: Secondary | ICD-10-CM | POA: Diagnosis not present

## 2014-02-28 DIAGNOSIS — Z79899 Other long term (current) drug therapy: Secondary | ICD-10-CM | POA: Diagnosis not present

## 2014-02-28 DIAGNOSIS — Z9114 Patient's other noncompliance with medication regimen: Secondary | ICD-10-CM

## 2014-02-28 DIAGNOSIS — F17200 Nicotine dependence, unspecified, uncomplicated: Secondary | ICD-10-CM | POA: Insufficient documentation

## 2014-02-28 DIAGNOSIS — F410 Panic disorder [episodic paroxysmal anxiety] without agoraphobia: Secondary | ICD-10-CM | POA: Insufficient documentation

## 2014-02-28 DIAGNOSIS — R079 Chest pain, unspecified: Secondary | ICD-10-CM | POA: Insufficient documentation

## 2014-02-28 DIAGNOSIS — R0602 Shortness of breath: Secondary | ICD-10-CM | POA: Diagnosis not present

## 2014-02-28 DIAGNOSIS — Z91148 Patient's other noncompliance with medication regimen for other reason: Secondary | ICD-10-CM

## 2014-02-28 DIAGNOSIS — Z8742 Personal history of other diseases of the female genital tract: Secondary | ICD-10-CM | POA: Diagnosis not present

## 2014-02-28 DIAGNOSIS — F329 Major depressive disorder, single episode, unspecified: Secondary | ICD-10-CM

## 2014-02-28 HISTORY — DX: Panic disorder (episodic paroxysmal anxiety): F41.0

## 2014-02-28 LAB — RAPID URINE DRUG SCREEN, HOSP PERFORMED
AMPHETAMINES: NOT DETECTED
Barbiturates: NOT DETECTED
Benzodiazepines: NOT DETECTED
Cocaine: POSITIVE — AB
OPIATES: NOT DETECTED
Tetrahydrocannabinol: NOT DETECTED

## 2014-02-28 LAB — BASIC METABOLIC PANEL
Anion gap: 10 (ref 5–15)
BUN: 9 mg/dL (ref 6–23)
CO2: 26 mEq/L (ref 19–32)
Calcium: 9 mg/dL (ref 8.4–10.5)
Chloride: 105 mEq/L (ref 96–112)
Creatinine, Ser: 0.83 mg/dL (ref 0.50–1.10)
GFR calc non Af Amer: >90 mL/min (ref >90)
GLUCOSE: 81 mg/dL (ref 70–99)
POTASSIUM: 3.9 meq/L (ref 3.7–5.3)
SODIUM: 141 meq/L (ref 137–147)

## 2014-02-28 LAB — CBC WITH DIFFERENTIAL/PLATELET
Basophils Absolute: 0 10*3/uL (ref 0.0–0.1)
Basophils Relative: 0 % (ref 0–1)
Eosinophils Absolute: 0.2 10*3/uL (ref 0.0–0.7)
Eosinophils Relative: 3 % (ref 0–5)
HCT: 40.7 % (ref 36.0–46.0)
Hemoglobin: 13.7 g/dL (ref 12.0–15.0)
LYMPHS ABS: 2.7 10*3/uL (ref 0.7–4.0)
Lymphocytes Relative: 44 % (ref 12–46)
MCH: 32.5 pg (ref 26.0–34.0)
MCHC: 33.7 g/dL (ref 30.0–36.0)
MCV: 96.7 fL (ref 78.0–100.0)
Monocytes Absolute: 0.4 10*3/uL (ref 0.1–1.0)
Monocytes Relative: 6 % (ref 3–12)
NEUTROS ABS: 2.9 10*3/uL (ref 1.7–7.7)
NEUTROS PCT: 47 % (ref 43–77)
Platelets: 318 10*3/uL (ref 150–400)
RBC: 4.21 MIL/uL (ref 3.87–5.11)
RDW: 13.4 % (ref 11.5–15.5)
WBC: 6.2 10*3/uL (ref 4.0–10.5)

## 2014-02-28 LAB — ETHANOL: Alcohol, Ethyl (B): <11 mg/dL (ref 0–11)

## 2014-02-28 LAB — TROPONIN I
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
## 2014-02-28 NOTE — Progress Notes (Signed)
Tele Psych completed

## 2014-02-28 NOTE — Progress Notes (Signed)
Per Donell SievertSpencer Simon, PA patient has been accepted to the the [facility].  Writer informed the nurse of the patients disposition.  [facility].  The number to call report is [phone].  The accepting doctor is Renata Capriceonrad, NP.
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
## 2014-02-28 NOTE — ED Notes (Signed)
Pt is currently having tts consult

## 2014-03-01 ENCOUNTER — Observation Stay (HOSPITAL_COMMUNITY): Admission: AD | Admit: 2014-03-01 | Payer: Self-pay | Source: Intra-hospital | Admitting: Psychiatry

## 2014-03-01 NOTE — ED Notes (Signed)
[facility]. [facility] A[facility] and [facility]

## 2014-05-15 ENCOUNTER — Encounter (HOSPITAL_COMMUNITY): Payer: Self-pay | Admitting: Emergency Medicine

## 2014-05-28 ENCOUNTER — Encounter (HOSPITAL_COMMUNITY): Payer: Self-pay | Admitting: Cardiology

## 2014-05-28 ENCOUNTER — Emergency Department (HOSPITAL_COMMUNITY)
Admission: EM | Admit: 2014-05-28 | Discharge: 2014-05-28 | Discharge status: Against Medical Advice | Payer: Medicaid Other | Attending: Emergency Medicine | Admitting: Emergency Medicine

## 2014-05-28 ENCOUNTER — Emergency Department (HOSPITAL_COMMUNITY): Payer: Medicaid Other

## 2014-05-28 DIAGNOSIS — J45909 Unspecified asthma, uncomplicated: Secondary | ICD-10-CM | POA: Insufficient documentation

## 2014-05-28 DIAGNOSIS — Z720 Tobacco use: Secondary | ICD-10-CM | POA: Diagnosis not present

## 2014-05-28 DIAGNOSIS — G8929 Other chronic pain: Secondary | ICD-10-CM | POA: Diagnosis not present

## 2014-05-28 DIAGNOSIS — R079 Chest pain, unspecified: Secondary | ICD-10-CM | POA: Insufficient documentation

## 2014-05-28 LAB — BASIC METABOLIC PANEL
Anion gap: 14 (ref 5–15)
BUN: 9 mg/dL (ref 6–23)
CALCIUM: 8.7 mg/dL (ref 8.4–10.5)
CO2: 21 meq/L (ref 19–32)
CREATININE: 0.74 mg/dL (ref 0.50–1.10)
Chloride: 109 mEq/L (ref 96–112)
GFR calc Af Amer: >90 mL/min (ref >90)
GFR calc non Af Amer: >90 mL/min (ref >90)
GLUCOSE: 95 mg/dL (ref 70–99)
Potassium: 4.2 mEq/L (ref 3.7–5.3)
Sodium: 144 mEq/L (ref 137–147)

## 2014-05-28 LAB — CBC WITH DIFFERENTIAL/PLATELET
Basophils Absolute: 0 10*3/uL (ref 0.0–0.1)
Basophils Relative: 0 % (ref 0–1)
EOS PCT: 2 % (ref 0–5)
Eosinophils Absolute: 0.1 10*3/uL (ref 0.0–0.7)
HEMATOCRIT: 37.5 % (ref 36.0–46.0)
Hemoglobin: 12.7 g/dL (ref 12.0–15.0)
LYMPHS ABS: 2.9 10*3/uL (ref 0.7–4.0)
Lymphocytes Relative: 54 % — ABNORMAL HIGH (ref 12–46)
MCH: 32.2 pg (ref 26.0–34.0)
MCHC: 33.9 g/dL (ref 30.0–36.0)
MCV: 94.9 fL (ref 78.0–100.0)
MONO ABS: 0.4 10*3/uL (ref 0.1–1.0)
Monocytes Relative: 7 % (ref 3–12)
Neutro Abs: 2 10*3/uL (ref 1.7–7.7)
Neutrophils Relative %: 37 % — ABNORMAL LOW (ref 43–77)
Platelets: 419 10*3/uL — ABNORMAL HIGH (ref 150–400)
RBC: 3.95 MIL/uL (ref 3.87–5.11)
RDW: 13.3 % (ref 11.5–15.5)
WBC: 5.3 10*3/uL (ref 4.0–10.5)

## 2014-05-28 LAB — TROPONIN I: Troponin I: <0.3 ng/mL (ref <0.30)

## 2014-05-28 NOTE — ED Notes (Signed)
Chest tightness times 30 min.  Has been evaluated for the same before.

## 2014-05-28 NOTE — ED Notes (Signed)
Called name to go back to room.  No answer.

## 2014-05-28 NOTE — ED Notes (Signed)
Called name again to go back to room.  No answer.

## 2014-05-30 ENCOUNTER — Encounter: Payer: Self-pay | Admitting: Obstetrics & Gynecology

## 2014-05-30 ENCOUNTER — Ambulatory Visit (INDEPENDENT_AMBULATORY_CARE_PROVIDER_SITE_OTHER): Payer: Medicaid Other | Admitting: Obstetrics & Gynecology

## 2014-05-30 VITALS — BP 130/60 | Wt 138.0 lb

## 2014-05-30 DIAGNOSIS — A5901 Trichomonal vulvovaginitis: Secondary | ICD-10-CM

## 2014-05-30 DIAGNOSIS — Z7251 High risk heterosexual behavior: Secondary | ICD-10-CM | POA: Diagnosis not present

## 2014-05-30 MED ORDER — TINIDAZOLE 500 MG PO TABS: ORAL_TABLET | ORAL | Status: DC

## 2014-05-30 NOTE — Progress Notes (Signed)
Patient ID: Katherine Burgess, female   DOB: 08/21/1980, 33 y.o.   MRN: [id number]    Pt presents complaining of vaginal discharge and wants STD check  Blood pressure 130/60, weight 138 lb (62.596 kg), last menstrual period 05/21/2014.  Vulva NEFG Vagina bubbly discharge  Cervix no lesions -CMT  Wet Prep:  Trich +    Tinidazole 500 BID x 14 days  STD done otherwise  Trichomonas vaginitis:  Tinidazole therapy

## 2014-05-31 LAB — HEPATITIS C ANTIBODY: HCV Ab: NEGATIVE

## 2014-05-31 LAB — HIV ANTIBODY (ROUTINE TESTING W REFLEX): HIV 1&2 Ab, 4th Generation: NONREACTIVE

## 2014-05-31 LAB — RPR

## 2014-05-31 LAB — GC/CHLAMYDIA PROBE AMP
CT Probe RNA: NEGATIVE
GC Probe RNA: NEGATIVE

## 2014-06-01 ENCOUNTER — Telehealth: Payer: Self-pay | Admitting: *Deleted

## 2014-06-01 LAB — HSV 2 ANTIBODY, IGG: HSV 2 Glycoprotein G Ab, IgG: 1.79 IV — ABNORMAL HIGH

## 2014-06-02 ENCOUNTER — Telehealth: Payer: Self-pay | Admitting: *Deleted

## 2014-06-02 NOTE — Telephone Encounter (Signed)
Pt calling requesting her Rx for Tinidazole 500 mg. Pt informed started the PA for the Tinidazole was told could take up to 24 hours. Pt informed will call as soon as I hear back from PA. Pt verbalized understanding.

## 2014-06-20 ENCOUNTER — Ambulatory Visit: Payer: Medicaid Other | Admitting: Obstetrics & Gynecology

## 2014-06-20 ENCOUNTER — Encounter: Payer: Self-pay | Admitting: *Deleted

## 2014-06-20 ENCOUNTER — Encounter: Payer: Self-pay | Admitting: Obstetrics & Gynecology

## 2014-07-13 ENCOUNTER — Emergency Department (HOSPITAL_COMMUNITY): Payer: Medicaid Other

## 2014-07-13 ENCOUNTER — Emergency Department (HOSPITAL_COMMUNITY)
Admission: EM | Admit: 2014-07-13 | Discharge: 2014-07-13 | Disposition: A | Discharge status: Home / Self Care | Payer: Medicaid Other | Attending: Emergency Medicine | Admitting: Emergency Medicine

## 2014-07-13 ENCOUNTER — Encounter (HOSPITAL_COMMUNITY): Payer: Self-pay | Admitting: Cardiology

## 2014-07-13 DIAGNOSIS — Z87442 Personal history of urinary calculi: Secondary | ICD-10-CM | POA: Insufficient documentation

## 2014-07-13 DIAGNOSIS — Z87448 Personal history of other diseases of urinary system: Secondary | ICD-10-CM | POA: Insufficient documentation

## 2014-07-13 DIAGNOSIS — F319 Bipolar disorder, unspecified: Secondary | ICD-10-CM | POA: Diagnosis not present

## 2014-07-13 DIAGNOSIS — J45909 Unspecified asthma, uncomplicated: Secondary | ICD-10-CM | POA: Insufficient documentation

## 2014-07-13 DIAGNOSIS — J029 Acute pharyngitis, unspecified: Secondary | ICD-10-CM | POA: Insufficient documentation

## 2014-07-13 DIAGNOSIS — R5383 Other fatigue: Secondary | ICD-10-CM | POA: Diagnosis not present

## 2014-07-13 DIAGNOSIS — G8929 Other chronic pain: Secondary | ICD-10-CM | POA: Diagnosis not present

## 2014-07-13 DIAGNOSIS — Z720 Tobacco use: Secondary | ICD-10-CM | POA: Diagnosis not present

## 2014-07-13 DIAGNOSIS — Z79899 Other long term (current) drug therapy: Secondary | ICD-10-CM | POA: Diagnosis not present

## 2014-07-13 DIAGNOSIS — R110 Nausea: Secondary | ICD-10-CM | POA: Diagnosis not present

## 2014-07-13 DIAGNOSIS — F410 Panic disorder [episodic paroxysmal anxiety] without agoraphobia: Secondary | ICD-10-CM | POA: Insufficient documentation

## 2014-07-13 DIAGNOSIS — R05 Cough: Secondary | ICD-10-CM

## 2014-07-13 DIAGNOSIS — R059 Cough, unspecified: Secondary | ICD-10-CM

## 2014-07-13 DIAGNOSIS — F4310 Post-traumatic stress disorder, unspecified: Secondary | ICD-10-CM | POA: Diagnosis not present

## 2014-07-13 MED ORDER — IBUPROFEN 800 MG PO TABS
800.0000 mg | ORAL_TABLET | Freq: Once | ORAL | Status: DC
  Filled 2014-07-13: qty 1

## 2014-07-13 MED ORDER — IBUPROFEN 100 MG/5ML PO SUSP: 600.0000 mg | Freq: Once | ORAL | Status: DC

## 2014-07-13 MED ORDER — BENZONATATE 100 MG PO CAPS
200.0000 mg | ORAL_CAPSULE | Freq: Once | ORAL | Status: AC
  Administered 2014-07-13: 200 mg via ORAL
  Filled 2014-07-13: qty 2

## 2014-07-13 MED ORDER — IBUPROFEN 100 MG/5ML PO SUSP
400.0000 mg | Freq: Once | ORAL | Status: AC
  Administered 2014-07-13: 400 mg via ORAL
  Filled 2014-07-13: qty 20

## 2014-07-13 NOTE — ED Notes (Signed)
Fever and cough times 3 days.  Nauseated.

## 2014-07-13 NOTE — ED Notes (Signed)
Per patient ride has to leave and be somewhere at 2:30-x-ray back. EDPa made aware-went to tell patient. Patient no longer in room, personal belongings gone.
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
## 2014-08-07 ENCOUNTER — Emergency Department (HOSPITAL_COMMUNITY): Payer: Medicaid Other

## 2014-08-07 ENCOUNTER — Emergency Department (HOSPITAL_COMMUNITY)
Admission: EM | Admit: 2014-08-07 | Discharge: 2014-08-07 | Disposition: A | Discharge status: Home / Self Care | Payer: Medicaid Other | Attending: Emergency Medicine | Admitting: Emergency Medicine

## 2014-08-07 ENCOUNTER — Encounter (HOSPITAL_COMMUNITY): Payer: Self-pay | Admitting: Emergency Medicine

## 2014-08-07 DIAGNOSIS — F419 Anxiety disorder, unspecified: Secondary | ICD-10-CM | POA: Diagnosis not present

## 2014-08-07 DIAGNOSIS — G8929 Other chronic pain: Secondary | ICD-10-CM | POA: Insufficient documentation

## 2014-08-07 DIAGNOSIS — Z87442 Personal history of urinary calculi: Secondary | ICD-10-CM | POA: Diagnosis not present

## 2014-08-07 DIAGNOSIS — R079 Chest pain, unspecified: Secondary | ICD-10-CM | POA: Insufficient documentation

## 2014-08-07 DIAGNOSIS — R51 Headache: Secondary | ICD-10-CM | POA: Diagnosis not present

## 2014-08-07 DIAGNOSIS — F410 Panic disorder [episodic paroxysmal anxiety] without agoraphobia: Secondary | ICD-10-CM | POA: Diagnosis not present

## 2014-08-07 DIAGNOSIS — Z720 Tobacco use: Secondary | ICD-10-CM | POA: Diagnosis not present

## 2014-08-07 DIAGNOSIS — J45909 Unspecified asthma, uncomplicated: Secondary | ICD-10-CM | POA: Insufficient documentation

## 2014-08-07 DIAGNOSIS — Z87448 Personal history of other diseases of urinary system: Secondary | ICD-10-CM | POA: Diagnosis not present

## 2014-08-07 DIAGNOSIS — R112 Nausea with vomiting, unspecified: Secondary | ICD-10-CM | POA: Diagnosis not present

## 2014-08-07 DIAGNOSIS — Z79899 Other long term (current) drug therapy: Secondary | ICD-10-CM | POA: Insufficient documentation

## 2014-08-07 DIAGNOSIS — F13239 Sedative, hypnotic or anxiolytic dependence with withdrawal, unspecified: Secondary | ICD-10-CM

## 2014-08-07 DIAGNOSIS — F13939 Sedative, hypnotic or anxiolytic use, unspecified with withdrawal, unspecified: Secondary | ICD-10-CM

## 2014-08-07 LAB — CBC WITH DIFFERENTIAL/PLATELET
BASOS ABS: 0 10*3/uL (ref 0.0–0.1)
Basophils Relative: 0 % (ref 0–1)
EOS ABS: 0.1 10*3/uL (ref 0.0–0.7)
Eosinophils Relative: 1 % (ref 0–5)
HCT: 45.8 % (ref 36.0–46.0)
HEMOGLOBIN: 15.3 g/dL — AB (ref 12.0–15.0)
LYMPHS ABS: 2.2 10*3/uL (ref 0.7–4.0)
LYMPHS PCT: 32 % (ref 12–46)
MCH: 32.4 pg (ref 26.0–34.0)
MCHC: 33.4 g/dL (ref 30.0–36.0)
MCV: 97 fL (ref 78.0–100.0)
Monocytes Absolute: 0.4 10*3/uL (ref 0.1–1.0)
Monocytes Relative: 5 % (ref 3–12)
Neutro Abs: 4.2 10*3/uL (ref 1.7–7.7)
Neutrophils Relative %: 62 % (ref 43–77)
PLATELETS: 313 10*3/uL (ref 150–400)
RBC: 4.72 MIL/uL (ref 3.87–5.11)
RDW: 13.4 % (ref 11.5–15.5)
WBC: 6.8 10*3/uL (ref 4.0–10.5)

## 2014-08-07 LAB — COMPREHENSIVE METABOLIC PANEL
ALBUMIN: 4.5 g/dL (ref 3.5–5.2)
ALT: 12 U/L (ref 0–35)
AST: 14 U/L (ref 0–37)
Alkaline Phosphatase: 65 U/L (ref 39–117)
Anion gap: 7 (ref 5–15)
BUN: 9 mg/dL (ref 6–23)
CALCIUM: 9.3 mg/dL (ref 8.4–10.5)
CHLORIDE: 106 mmol/L (ref 96–112)
CO2: 22 mmol/L (ref 19–32)
Creatinine, Ser: 0.93 mg/dL (ref 0.50–1.10)
GFR calc non Af Amer: 79 mL/min — ABNORMAL LOW (ref >90)
Glucose, Bld: 102 mg/dL — ABNORMAL HIGH (ref 70–99)
POTASSIUM: 3.8 mmol/L (ref 3.5–5.1)
Sodium: 135 mmol/L (ref 135–145)
TOTAL PROTEIN: 8.1 g/dL (ref 6.0–8.3)
Total Bilirubin: 0.5 mg/dL (ref 0.3–1.2)

## 2014-08-07 LAB — TROPONIN I: Troponin I: <0.03 ng/mL (ref <0.031)

## 2014-08-07 MED ORDER — SODIUM CHLORIDE 0.9 % IV SOLN
INTRAVENOUS | Status: DC
  Administered 2014-08-07: 500 mL via INTRAVENOUS

## 2014-08-07 MED ORDER — SODIUM CHLORIDE 0.9 % IV BOLUS (SEPSIS)
500.0000 mL | Freq: Once | INTRAVENOUS | Status: AC
  Administered 2014-08-07: 500 mL via INTRAVENOUS

## 2014-08-07 MED ORDER — KETOROLAC TROMETHAMINE 30 MG/ML IJ SOLN
30.0000 mg | Freq: Once | INTRAMUSCULAR | Status: AC
  Administered 2014-08-07: 30 mg via INTRAVENOUS
  Filled 2014-08-07: qty 1

## 2014-08-07 MED ORDER — LORAZEPAM 2 MG/ML IJ SOLN
1.0000 mg | Freq: Once | INTRAMUSCULAR | Status: AC
  Administered 2014-08-07: 1 mg via INTRAVENOUS
  Filled 2014-08-07: qty 1
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
## 2014-08-07 NOTE — Discharge Instructions (Signed)
Follow-up with your doctor who prescribes your clonazepam, for further treatment and assistance.   Benzodiazepine Withdrawal  Benzodiazepines are a group of drugs that are prescribed for both short-term and long-term treatment of a variety of medical conditions. For some of these conditions, such as seizures and sudden and severe muscle spasms, they are used only for a few hours or a few days. For other conditions, such as anxiety, sleep problems, or frequent muscle spasms or to help prevent seizures, they are used for an extended period, usually weeks or months. Benzodiazepines work by changing the way your brain functions. Normally, chemicals in your brain called neurotransmitters send messages between your brain cells. The neurotransmitter that benzodiazepines affect is called gamma-aminobutyric acid (GABA). GABA sends out messages that have a calming effect on many of the functions of your brain. Benzodiazepines make these messages stronger and increase this calming effect. Short-term use of benzodiazepines usually does not cause problems when you stop taking the drugs. However, if you take benzodiazepines for a long time, your body can adjust to the drug and require more of it to produce the same effect (drug tolerance). Eventually, you can develop physical dependence on benzodiazepines, which is when you experience negative effects if your dosage of benzodiazepines is reduced or stopped too quickly. These negative effects are called symptoms of withdrawal. SYMPTOMS Symptoms of withdrawal may begin anytime within the first 10 days after you stop taking the benzodiazepine. They can last from several weeks up to a few months but usually are the worst between the first 10 to 14 days.  The actual symptoms also vary, depending on the type of benzodiazepine you take. Possible symptoms include:  Anxiety.  Excitability.  Irritability.  Depression.  Mood swings.  Trouble  sleeping.  Confusion.  Uncontrollable shaking (tremors).  Muscle weakness.  Seizures. DIAGNOSIS To diagnose benzodiazepine withdrawal, your caregiver will examine you for certain signs, such as:  Rapid heartbeat.  Rapid breathing.  Tremors.  High blood pressure.  Fever.  Mood changes. Your caregiver also may ask the following questions about your use of benzodiazepines:  What type of benzodiazepine did you take?  How much did you take each day?  How long did you take the drug?  When was the last time you took the drug?  Do you take any other drugs?  Have you had alcohol recently?  Have you had a seizure recently?  Have you lost consciousness recently?  Have you had trouble remembering recent events?  Have you had a recent increase in anxiety, irritability, or trouble sleeping? A drug test also may be administered. TREATMENT The treatment for benzodiazepine withdrawal can vary, depending on the type and severity of your symptoms, what type of benzodiazepine you have been taking, and how long you have been taking the benzodiazepine. Sometimes it is necessary for you to be treated in a hospital, especially if you are at risk of seizures.  Often, treatment includes a prescription for a long-acting benzodiazepine, the dosage of which is reduced slowly over a long period. This period could be several weeks or months. Eventually, your dosage will be reduced to a point that you can stop taking the drug, without experiencing withdrawal symptoms. This is called tapered withdrawal. Occasionally, minor symptoms of withdrawal continue for a few days or weeks after you have completed a tapered withdrawal. SEEK IMMEDIATE MEDICAL CARE IF:  You have a seizure.  You develop a craving for drugs or alcohol. Chest Pain (Nonspecific) It is often hard to  give a diagnosis for the cause of chest pain. There is always a chance that your pain could be related to something serious, such as  a heart attack or a blood clot in the lungs. You need to follow up with your doctor. HOME CARE If antibiotic medicine was given, take it as directed by your doctor. Finish the medicine even if you start to feel better. For the next few days, avoid activities that bring on chest pain. Continue physical activities as told by your doctor. Do not use any tobacco products. This includes cigarettes, chewing tobacco, and e-cigarettes. Avoid drinking alcohol. Only take medicine as told by your doctor. Follow your doctor's suggestions for more testing if your chest pain does not go away. Keep all doctor visits you made. GET HELP IF: Your chest pain does not go away, even after treatment. You have a rash with blisters on your chest. You have a fever. GET HELP RIGHT AWAY IF:  You have more pain or pain that spreads to your arm, neck, jaw, back, or belly (abdomen). You have shortness of breath. You cough more than usual or cough up blood. You have very bad back or belly pain. You feel sick to your stomach (nauseous) or throw up (vomit). You have very bad weakness. You pass out (faint). You have chills. This is an emergency. Do not wait to see if the problems will go away. Call your local emergency services (911 in [place] Do not drive yourself to the hospital. MAKE SURE YOU:  Understand these instructions. Will watch your condition. Will get help right away if you are not doing well or get worse. Document Released: 12/17/2007 Document Revised: 07/05/2013 Document Reviewed: 12/17/2007 [employer] Patient Information 2015 [employer] [place]. This information is not intended to replace advice given to you by your health care provider. Make sure you discuss any questions you have with your health care provider.   You begin to experience symptoms of withdrawal during your tapered withdrawal.  You become very confused.  You lose consciousness.  You have trouble breathing.  You think about hurting  yourself or someone else. Document Released: 06/19/2011 Document Revised: 09/22/2011 Document Reviewed: 06/19/2011 [facility] Patient Information 2015 [place], [place]. This information is not intended to replace advice given to you by your health care provider. Make sure you discuss any questions you have with your health care provider.

## 2014-08-07 NOTE — ED Notes (Signed)
Discharge instructions reviewed with pt, questions answered. Pt verbalized understanding.  

## 2014-08-07 NOTE — ED Notes (Signed)
Pt requesting to eat, EDP approved, meal tray provided.

## 2014-09-01 ENCOUNTER — Encounter (HOSPITAL_COMMUNITY): Payer: Self-pay | Admitting: *Deleted

## 2014-09-01 ENCOUNTER — Emergency Department (HOSPITAL_COMMUNITY)
Admission: EM | Admit: 2014-09-01 | Discharge: 2014-09-01 | Disposition: A | Discharge status: Home / Self Care | Payer: Medicaid Other | Attending: Emergency Medicine | Admitting: Emergency Medicine

## 2014-09-01 DIAGNOSIS — Z720 Tobacco use: Secondary | ICD-10-CM | POA: Insufficient documentation

## 2014-09-01 DIAGNOSIS — F4310 Post-traumatic stress disorder, unspecified: Secondary | ICD-10-CM | POA: Insufficient documentation

## 2014-09-01 DIAGNOSIS — R102 Pelvic and perineal pain: Secondary | ICD-10-CM

## 2014-09-01 DIAGNOSIS — Z9889 Other specified postprocedural states: Secondary | ICD-10-CM | POA: Diagnosis not present

## 2014-09-01 DIAGNOSIS — G8929 Other chronic pain: Secondary | ICD-10-CM | POA: Diagnosis not present

## 2014-09-01 DIAGNOSIS — F410 Panic disorder [episodic paroxysmal anxiety] without agoraphobia: Secondary | ICD-10-CM | POA: Diagnosis not present

## 2014-09-01 DIAGNOSIS — N390 Urinary tract infection, site not specified: Secondary | ICD-10-CM | POA: Insufficient documentation

## 2014-09-01 DIAGNOSIS — F319 Bipolar disorder, unspecified: Secondary | ICD-10-CM | POA: Diagnosis not present

## 2014-09-01 DIAGNOSIS — Z87448 Personal history of other diseases of urinary system: Secondary | ICD-10-CM | POA: Insufficient documentation

## 2014-09-01 DIAGNOSIS — Z3202 Encounter for pregnancy test, result negative: Secondary | ICD-10-CM | POA: Diagnosis not present

## 2014-09-01 DIAGNOSIS — Z79899 Other long term (current) drug therapy: Secondary | ICD-10-CM | POA: Insufficient documentation

## 2014-09-01 DIAGNOSIS — Z87442 Personal history of urinary calculi: Secondary | ICD-10-CM | POA: Insufficient documentation

## 2014-09-01 DIAGNOSIS — J45909 Unspecified asthma, uncomplicated: Secondary | ICD-10-CM | POA: Diagnosis not present

## 2014-09-01 LAB — WET PREP, GENITAL
Clue Cells Wet Prep HPF POC: NONE SEEN
Trich, Wet Prep: NONE SEEN
Yeast Wet Prep HPF POC: NONE SEEN

## 2014-09-01 LAB — CBC WITH DIFFERENTIAL/PLATELET
BASOS ABS: 0 10*3/uL (ref 0.0–0.1)
Basophils Relative: 0 % (ref 0–1)
EOS PCT: 1 % (ref 0–5)
Eosinophils Absolute: 0.1 10*3/uL (ref 0.0–0.7)
HEMATOCRIT: 40.8 % (ref 36.0–46.0)
Hemoglobin: 13.4 g/dL (ref 12.0–15.0)
Lymphocytes Relative: 37 % (ref 12–46)
Lymphs Abs: 3.5 10*3/uL (ref 0.7–4.0)
MCH: 32.1 pg (ref 26.0–34.0)
MCHC: 32.8 g/dL (ref 30.0–36.0)
MCV: 97.8 fL (ref 78.0–100.0)
MONO ABS: 0.5 10*3/uL (ref 0.1–1.0)
Monocytes Relative: 5 % (ref 3–12)
Neutro Abs: 5.3 10*3/uL (ref 1.7–7.7)
Neutrophils Relative %: 57 % (ref 43–77)
PLATELETS: 406 10*3/uL — AB (ref 150–400)
RBC: 4.17 MIL/uL (ref 3.87–5.11)
RDW: 13.1 % (ref 11.5–15.5)
WBC: 9.5 10*3/uL (ref 4.0–10.5)

## 2014-09-01 LAB — URINALYSIS, ROUTINE W REFLEX MICROSCOPIC
BILIRUBIN URINE: NEGATIVE
Glucose, UA: NEGATIVE mg/dL
Ketones, ur: NEGATIVE mg/dL
NITRITE: POSITIVE — AB
PH: 6 (ref 5.0–8.0)
Protein, ur: NEGATIVE mg/dL
Specific Gravity, Urine: >1.03 — ABNORMAL HIGH (ref 1.005–1.030)
UROBILINOGEN UA: 0.2 mg/dL (ref 0.0–1.0)

## 2014-09-01 LAB — BASIC METABOLIC PANEL
ANION GAP: 4 — AB (ref 5–15)
BUN: 19 mg/dL (ref 6–23)
CHLORIDE: 107 mmol/L (ref 96–112)
CO2: 27 mmol/L (ref 19–32)
Calcium: 9 mg/dL (ref 8.4–10.5)
Creatinine, Ser: 0.89 mg/dL (ref 0.50–1.10)
GFR calc Af Amer: >90 mL/min (ref >90)
GFR calc non Af Amer: 83 mL/min — ABNORMAL LOW (ref >90)
Glucose, Bld: 75 mg/dL (ref 70–99)
Potassium: 3.8 mmol/L (ref 3.5–5.1)
Sodium: 138 mmol/L (ref 135–145)

## 2014-09-01 LAB — URINE MICROSCOPIC-ADD ON

## 2014-09-01 LAB — PREGNANCY, URINE: PREG TEST UR: NEGATIVE

## 2014-09-01 MED ORDER — CEPHALEXIN 500 MG PO CAPS: 500.0000 mg | ORAL_CAPSULE | Freq: Four times a day (QID) | ORAL | Status: DC

## 2014-09-01 MED ORDER — HYDROCODONE-ACETAMINOPHEN 5-325 MG PO TABS
2.0000 | ORAL_TABLET | ORAL | Status: DC | PRN
Start: 2014-09-01 — End: 2014-09-21

## 2014-09-01 MED ORDER — AZITHROMYCIN 250 MG PO TABS
1000.0000 mg | ORAL_TABLET | Freq: Once | ORAL | Status: AC
  Administered 2014-09-01: 1000 mg via ORAL
  Filled 2014-09-01: qty 4

## 2014-09-01 MED ORDER — LIDOCAINE HCL (PF) 1 % IJ SOLN
INTRAMUSCULAR | Status: AC
  Filled 2014-09-01: qty 5

## 2014-09-01 MED ORDER — CEFTRIAXONE SODIUM 250 MG IJ SOLR
250.0000 mg | Freq: Once | INTRAMUSCULAR | Status: AC
  Administered 2014-09-01: 250 mg via INTRAMUSCULAR
  Filled 2014-09-01: qty 250

## 2014-09-01 NOTE — Discharge Instructions (Signed)
Pelvic Pain °Female pelvic pain can be caused by many different things and start from a variety of places. Pelvic pain refers to pain that is located in the lower half of the abdomen and between your hips. The pain may occur over a short period of time (acute) or may be reoccurring (chronic). The cause of pelvic pain may be related to disorders affecting the female reproductive organs (gynecologic), but it may also be related to the bladder, kidney stones, an intestinal complication, or muscle or skeletal problems. Getting help right away for pelvic pain is important, especially if there has been severe, sharp, or a sudden onset of unusual pain. It is also important to get help right away because some types of pelvic pain can be life threatening.  °CAUSES  °Below are only some of the causes of pelvic pain. The causes of pelvic pain can be in one of several categories.  °· Gynecologic. °· Pelvic inflammatory disease. °· Sexually transmitted infection. °· Ovarian cyst or a twisted ovarian ligament (ovarian torsion). °· Uterine lining that grows outside the uterus (endometriosis). °· Fibroids, cysts, or tumors. °· Ovulation. °· Pregnancy. °· Pregnancy that occurs outside the uterus (ectopic pregnancy). °· Miscarriage. °· Labor. °· Abruption of the placenta or ruptured uterus. °· Infection. °· Uterine infection (endometritis). °· Bladder infection. °· Diverticulitis. °· Miscarriage related to a uterine infection (septic abortion). °· Bladder. °· Inflammation of the bladder (cystitis). °· Kidney stone(s). °· Gastrointestinal. °· Constipation. °· Diverticulitis. °· Neurologic. °· Trauma. °· Feeling pelvic pain because of mental or emotional causes (psychosomatic). °· Cancers of the bowel or pelvis. °EVALUATION  °Your caregiver will want to take a careful history of your concerns. This includes recent changes in your health, a careful gynecologic history of your periods (menses), and a sexual history. Obtaining your family  history and medical history is also important. Your caregiver may suggest a pelvic exam. A pelvic exam will help identify the location and severity of the pain. It also helps in the evaluation of which organ system may be involved. In order to identify the cause of the pelvic pain and be properly treated, your caregiver may order tests. These tests may include:  °· A pregnancy test. °· Pelvic ultrasonography. °· An X-ray exam of the abdomen. °· A urinalysis or evaluation of vaginal discharge. °· Blood tests. °HOME CARE INSTRUCTIONS  °· Only take over-the-counter or prescription medicines for pain, discomfort, or fever as directed by your caregiver.   °· Rest as directed by your caregiver.   °· Eat a balanced diet.   °· Drink enough fluids to make your urine clear or pale yellow, or as directed.   °· Avoid sexual intercourse if it causes pain.   °· Apply warm or cold compresses to the lower abdomen depending on which one helps the pain.   °· Avoid stressful situations.   °· Keep a journal of your pelvic pain. Write down when it started, where the pain is located, and if there are things that seem to be associated with the pain, such as food or your menstrual cycle. °· Follow up with your caregiver as directed.   °SEEK MEDICAL CARE IF: °· Your medicine does not help your pain. °· You have abnormal vaginal discharge. °SEEK IMMEDIATE MEDICAL CARE IF:  °· You have heavy bleeding from the vagina.   °· Your pelvic pain increases.   °· You feel light-headed or faint.   °· You have chills.   °· You have pain with urination or blood in your urine.   °· You have uncontrolled diarrhea   or vomiting.   You have a fever or persistent symptoms for more than 3 days.  You have a fever and your symptoms suddenly get worse.   You are being physically or sexually abused.  MAKE SURE YOU:  Understand these instructions.  Will watch your condition.  Will get help if you are not doing well or get worse. Document Released:  05/27/2004 Document Revised: 11/14/2013 Document Reviewed: 10/20/2011 [facility] Patient Information 2015 [place], [place]. This information is not intended to replace advice given to you by your health care provider. Make sure you discuss any questions you have with your health care provider.  Urinary Tract Infection Urinary tract infections (UTIs) can develop anywhere along your urinary tract. Your urinary tract is your body's drainage system for removing wastes and extra water. Your urinary tract includes two kidneys, two ureters, a bladder, and a urethra. Your kidneys are a pair of bean-shaped organs. Each kidney is about the size of your fist. They are located below your ribs, one on each side of your spine. CAUSES Infections are caused by microbes, which are microscopic organisms, including fungi, viruses, and bacteria. These organisms are so small that they can only be seen through a microscope. Bacteria are the microbes that most commonly cause UTIs. SYMPTOMS  Symptoms of UTIs may vary by age and gender of the patient and by the location of the infection. Symptoms in young women typically include a frequent and intense urge to urinate and a painful, burning feeling in the bladder or urethra during urination. Older women and men are more likely to be tired, shaky, and weak and have muscle aches and abdominal pain. A fever may mean the infection is in your kidneys. Other symptoms of a kidney infection include pain in your back or sides below the ribs, nausea, and vomiting. DIAGNOSIS To diagnose a UTI, your caregiver will ask you about your symptoms. Your caregiver also will ask to provide a urine sample. The urine sample will be tested for bacteria and white blood cells. White blood cells are made by your body to help fight infection. TREATMENT  Typically, UTIs can be treated with medication. Because most UTIs are caused by a bacterial infection, they usually can be treated with the use of  antibiotics. The choice of antibiotic and length of treatment depend on your symptoms and the type of bacteria causing your infection. HOME CARE INSTRUCTIONS  If you were prescribed antibiotics, take them exactly as your caregiver instructs you. Finish the medication even if you feel better after you have only taken some of the medication.  Drink enough water and fluids to keep your urine clear or pale yellow.  Avoid caffeine, tea, and carbonated beverages. They tend to irritate your bladder.  Empty your bladder often. Avoid holding urine for long periods of time.  Empty your bladder before and after sexual intercourse.  After a bowel movement, women should cleanse from front to back. Use each tissue only once. SEEK MEDICAL CARE IF:   You have back pain.  You develop a fever.  Your symptoms do not begin to resolve within 3 days. SEEK IMMEDIATE MEDICAL CARE IF:   You have severe back pain or lower abdominal pain.  You develop chills.  You have nausea or vomiting.  You have continued burning or discomfort with urination. MAKE SURE YOU:   Understand these instructions.  Will watch your condition.  Will get help right away if you are not doing well or get worse. Document Released: 04/09/2005  Document Revised: 12/30/2011 Document Reviewed: 08/08/2011 [facility] Patient Information 2015 [place]. This information is not intended to replace advice given to you by your health care provider. Make sure you discuss any questions you have with your health care provider. Pelvic Inflammatory Disease Pelvic inflammatory disease (PID) refers to an infection in some or all of the female organs. The infection can be in the uterus, ovaries, fallopian tubes, or the surrounding tissues in the pelvis. PID can cause abdominal or pelvic pain that comes on suddenly (acute pelvic pain). PID is a serious infection because it can lead to lasting (chronic) pelvic pain or the inability to have  children (infertile).  CAUSES  The infection is often caused by the normal bacteria found in the vaginal tissues. PID may also be caused by an infection that is spread during sexual contact. PID can also occur following:   The birth of a baby.   A miscarriage.   An abortion.   Major pelvic surgery.   The use of an intrauterine device (IUD).   A sexual assault.  RISK FACTORS Certain factors can put a person at higher risk for PID, such as:  Being younger than 25 years.  Being sexually active at [place] age.  Usingnonbarrier contraception.  Havingmultiple sexual partners.  Having sex with someone who has symptoms of a genital infection.  Using oral contraception. Other times, certain behaviors can increase the possibility of getting PID, such as:  Having sex during your period.  Using a vaginal douche.  Having an intrauterine device (IUD) in place. SYMPTOMS   Abdominal or pelvic pain.   Fever.   Chills.   Abnormal vaginal discharge.  Abnormal uterine bleeding.   Unusual pain shortly after finishing your period. DIAGNOSIS  Your caregiver will choose some of the following methods to make a diagnosis, such as:   Performinga physical exam and history. A pelvic exam typically reveals a very tender uterus and surrounding pelvis.   Ordering laboratory tests including a pregnancy test, blood tests, and urine test.  Orderingcultures of the vagina and cervix to check for a sexually transmitted infection (STI).  Performing an ultrasound.   Performing a laparoscopic procedure to look inside the pelvis.  TREATMENT   Antibiotic medicines may be prescribed and taken by mouth.   Sexual partners may be treated when the infection is caused by a sexually transmitted disease (STD).   Hospitalization may be needed to give antibiotics intravenously.  Surgery may be needed, but this is rare. It may take weeks until you are completely well. If you are  diagnosed with PID, you should also be checked for human immunodeficiency virus (HIV). HOME CARE INSTRUCTIONS   If given, take your antibiotics as directed. Finish the medicine even if you start to feel better.   Only take over-the-counter or prescription medicines for pain, discomfort, or fever as directed by your caregiver.   Do not have sexual intercourse until treatment is completed or as directed by your caregiver. If PID is confirmed, your recent sexual partner(s) will need treatment.   Keep your follow-up appointments. SEEK MEDICAL CARE IF:   You have increased or abnormal vaginal discharge.   You need prescription medicine for your pain.   You vomit.   You cannot take your medicines.   Your partner has an STD.  SEEK IMMEDIATE MEDICAL CARE IF:   You have a fever.   You have increased abdominal or pelvic pain.   You have chills.   You have pain when  you urinate.   You are not better after 72 hours following treatment.  MAKE SURE YOU:   Understand these instructions.  Will watch your condition.  Will get help right away if you are not doing well or get worse. Document Released: 06/30/2005 Document Revised: 10/25/2012 Document Reviewed: 06/26/2011 [facility] Patient Information 2015 [place], [place]. This information is not intended to replace advice given to you by your health care provider. Make sure you discuss any questions you have with your health care provider.
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
## 2014-09-01 NOTE — ED Notes (Signed)
Lab at bedside

## 2014-09-01 NOTE — ED Notes (Signed)
abd pain , nausea, no vomiting,  White d/c with "bad odor". Having menstrual flow now.

## 2014-09-01 NOTE — ED Notes (Signed)
Pt alert & oriented x4, stable gait. Patient given discharge instructions, paperwork & prescription(s). Patient informed not to drive, operate any equipment & handel any important documents 4 hours after taking pain medication. Patient  instructed to stop at the registration desk to finish any additional paperwork. Patient  verbalized understanding. Pt left department w/ no further questions. 

## 2014-09-04 LAB — GC/CHLAMYDIA PROBE AMP (~~LOC~~) NOT AT ARMC
Chlamydia: NEGATIVE
Neisseria Gonorrhea: NEGATIVE

## 2014-09-05 ENCOUNTER — Emergency Department (HOSPITAL_COMMUNITY): Payer: Medicaid Other

## 2014-09-05 ENCOUNTER — Emergency Department (HOSPITAL_COMMUNITY)
Admission: EM | Admit: 2014-09-05 | Discharge: 2014-09-05 | Disposition: A | Discharge status: Home / Self Care | Payer: Medicaid Other | Attending: Emergency Medicine | Admitting: Emergency Medicine

## 2014-09-05 ENCOUNTER — Encounter (HOSPITAL_COMMUNITY): Payer: Self-pay | Admitting: *Deleted

## 2014-09-05 DIAGNOSIS — M542 Cervicalgia: Secondary | ICD-10-CM | POA: Insufficient documentation

## 2014-09-05 DIAGNOSIS — Z87442 Personal history of urinary calculi: Secondary | ICD-10-CM | POA: Diagnosis not present

## 2014-09-05 DIAGNOSIS — Z720 Tobacco use: Secondary | ICD-10-CM | POA: Diagnosis not present

## 2014-09-05 DIAGNOSIS — Z792 Long term (current) use of antibiotics: Secondary | ICD-10-CM | POA: Insufficient documentation

## 2014-09-05 DIAGNOSIS — R51 Headache: Secondary | ICD-10-CM | POA: Insufficient documentation

## 2014-09-05 DIAGNOSIS — R55 Syncope and collapse: Secondary | ICD-10-CM | POA: Insufficient documentation

## 2014-09-05 DIAGNOSIS — G8929 Other chronic pain: Secondary | ICD-10-CM | POA: Insufficient documentation

## 2014-09-05 DIAGNOSIS — J45909 Unspecified asthma, uncomplicated: Secondary | ICD-10-CM | POA: Insufficient documentation

## 2014-09-05 DIAGNOSIS — Z3202 Encounter for pregnancy test, result negative: Secondary | ICD-10-CM | POA: Insufficient documentation

## 2014-09-05 DIAGNOSIS — R079 Chest pain, unspecified: Secondary | ICD-10-CM

## 2014-09-05 DIAGNOSIS — F319 Bipolar disorder, unspecified: Secondary | ICD-10-CM | POA: Insufficient documentation

## 2014-09-05 DIAGNOSIS — R110 Nausea: Secondary | ICD-10-CM | POA: Insufficient documentation

## 2014-09-05 DIAGNOSIS — F410 Panic disorder [episodic paroxysmal anxiety] without agoraphobia: Secondary | ICD-10-CM | POA: Insufficient documentation

## 2014-09-05 DIAGNOSIS — R519 Headache, unspecified: Secondary | ICD-10-CM

## 2014-09-05 DIAGNOSIS — Z79899 Other long term (current) drug therapy: Secondary | ICD-10-CM | POA: Insufficient documentation

## 2014-09-05 DIAGNOSIS — R0789 Other chest pain: Secondary | ICD-10-CM | POA: Insufficient documentation

## 2014-09-05 DIAGNOSIS — A599 Trichomoniasis, unspecified: Secondary | ICD-10-CM | POA: Diagnosis not present

## 2014-09-05 LAB — CBC WITH DIFFERENTIAL/PLATELET
BASOS ABS: 0 10*3/uL (ref 0.0–0.1)
Basophils Relative: 0 % (ref 0–1)
EOS PCT: 2 % (ref 0–5)
Eosinophils Absolute: 0.2 10*3/uL (ref 0.0–0.7)
HCT: 41.7 % (ref 36.0–46.0)
Hemoglobin: 13.6 g/dL (ref 12.0–15.0)
LYMPHS PCT: 42 % (ref 12–46)
Lymphs Abs: 3 10*3/uL (ref 0.7–4.0)
MCH: 31.7 pg (ref 26.0–34.0)
MCHC: 32.6 g/dL (ref 30.0–36.0)
MCV: 97.2 fL (ref 78.0–100.0)
Monocytes Absolute: 0.5 10*3/uL (ref 0.1–1.0)
Monocytes Relative: 6 % (ref 3–12)
NEUTROS ABS: 3.6 10*3/uL (ref 1.7–7.7)
Neutrophils Relative %: 50 % (ref 43–77)
PLATELETS: 329 10*3/uL (ref 150–400)
RBC: 4.29 MIL/uL (ref 3.87–5.11)
RDW: 12.9 % (ref 11.5–15.5)
WBC: 7.2 10*3/uL (ref 4.0–10.5)

## 2014-09-05 LAB — COMPREHENSIVE METABOLIC PANEL
ALBUMIN: 4.2 g/dL (ref 3.5–5.2)
ALT: 14 U/L (ref 0–35)
AST: 14 U/L (ref 0–37)
Alkaline Phosphatase: 64 U/L (ref 39–117)
Anion gap: 3 — ABNORMAL LOW (ref 5–15)
BUN: 13 mg/dL (ref 6–23)
CO2: 28 mmol/L (ref 19–32)
Calcium: 8.8 mg/dL (ref 8.4–10.5)
Chloride: 105 mmol/L (ref 96–112)
Creatinine, Ser: 0.78 mg/dL (ref 0.50–1.10)
GFR calc Af Amer: >90 mL/min (ref >90)
Glucose, Bld: 106 mg/dL — ABNORMAL HIGH (ref 70–99)
Potassium: 3.8 mmol/L (ref 3.5–5.1)
SODIUM: 136 mmol/L (ref 135–145)
TOTAL PROTEIN: 7.9 g/dL (ref 6.0–8.3)
Total Bilirubin: 0.3 mg/dL (ref 0.3–1.2)

## 2014-09-05 LAB — URINALYSIS, ROUTINE W REFLEX MICROSCOPIC
Bilirubin Urine: NEGATIVE
GLUCOSE, UA: NEGATIVE mg/dL
HGB URINE DIPSTICK: NEGATIVE
Ketones, ur: NEGATIVE mg/dL
Nitrite: NEGATIVE
Protein, ur: NEGATIVE mg/dL
SPECIFIC GRAVITY, URINE: 1.025 (ref 1.005–1.030)
Urobilinogen, UA: 0.2 mg/dL (ref 0.0–1.0)
pH: 6 (ref 5.0–8.0)

## 2014-09-05 LAB — HIV ANTIBODY (ROUTINE TESTING W REFLEX): HIV Screen 4th Generation wRfx: NONREACTIVE

## 2014-09-05 LAB — TROPONIN I

## 2014-09-05 LAB — URINE MICROSCOPIC-ADD ON

## 2014-09-05 LAB — RPR: RPR: NONREACTIVE

## 2014-09-05 LAB — POC URINE PREG, ED: Preg Test, Ur: NEGATIVE

## 2014-09-05 MED ORDER — KETOROLAC TROMETHAMINE 30 MG/ML IJ SOLN
30.0000 mg | Freq: Once | INTRAMUSCULAR | Status: AC
  Administered 2014-09-05: 30 mg via INTRAVENOUS
  Filled 2014-09-05: qty 1

## 2014-09-05 MED ORDER — METOCLOPRAMIDE HCL 5 MG/ML IJ SOLN
10.0000 mg | Freq: Once | INTRAMUSCULAR | Status: AC
  Administered 2014-09-05: 10 mg via INTRAVENOUS
  Filled 2014-09-05: qty 2

## 2014-09-05 MED ORDER — SODIUM CHLORIDE 0.9 % IV SOLN
INTRAVENOUS | Status: DC
  Administered 2014-09-05: 18:00:00 via INTRAVENOUS

## 2014-09-05 MED ORDER — TINIDAZOLE 500 MG PO TABS: 2.0000 g | ORAL_TABLET | Freq: Once | ORAL | Status: DC

## 2014-09-05 MED ORDER — HYDROMORPHONE HCL 1 MG/ML IJ SOLN
0.5000 mg | Freq: Once | INTRAMUSCULAR | Status: AC
  Administered 2014-09-05: 0.5 mg via INTRAVENOUS
  Filled 2014-09-05: qty 1

## 2014-09-05 MED ORDER — DIPHENHYDRAMINE HCL 50 MG/ML IJ SOLN
25.0000 mg | Freq: Once | INTRAMUSCULAR | Status: AC
  Administered 2014-09-05: 25 mg via INTRAVENOUS
  Filled 2014-09-05: qty 1

## 2014-09-05 NOTE — ED Notes (Signed)
Pt alert & oriented x4, stable gait. Parent given discharge instructions, paperwork & prescription(s). Parent instructed to stop at the registration desk to finish any additional paperwork. Parent verbalized understanding. Pt left department w/ no further questions. 

## 2014-09-05 NOTE — ED Notes (Signed)
Headache, and chest pain, headache for 3 days, chest pain started last night.
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
## 2014-09-05 NOTE — ED Notes (Signed)
Pt. Recently voided; will give IV fluids and try for urine specimen again later.

## 2014-09-05 NOTE — ED Notes (Signed)
Called mother & left message pt has been discharged & is waiting for ride in waiting room.

## 2014-09-05 NOTE — ED Notes (Signed)
Pt. C/o HA and chest tigthness off and on for 3 days; pt states she takes klonopin for bipolar and anxiety; has not had klonopin x 1 month
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
## 2014-09-05 NOTE — Discharge Instructions (Signed)
Your EKG, chest x-ray, labs and CT scan today are normal. Follow up with your primary care doctor to discuss your headache and the symptoms you had today. Return here as needed.

## 2014-09-21 ENCOUNTER — Emergency Department (HOSPITAL_COMMUNITY): Payer: Medicaid Other

## 2014-09-21 ENCOUNTER — Encounter (HOSPITAL_COMMUNITY): Payer: Self-pay | Admitting: Emergency Medicine

## 2014-09-21 ENCOUNTER — Emergency Department (HOSPITAL_COMMUNITY)
Admission: EM | Admit: 2014-09-21 | Discharge: 2014-09-21 | Disposition: A | Discharge status: Home / Self Care | Payer: Medicaid Other | Attending: Emergency Medicine | Admitting: Emergency Medicine

## 2014-09-21 DIAGNOSIS — G8929 Other chronic pain: Secondary | ICD-10-CM | POA: Diagnosis not present

## 2014-09-21 DIAGNOSIS — J45909 Unspecified asthma, uncomplicated: Secondary | ICD-10-CM | POA: Diagnosis not present

## 2014-09-21 DIAGNOSIS — Z3202 Encounter for pregnancy test, result negative: Secondary | ICD-10-CM | POA: Insufficient documentation

## 2014-09-21 DIAGNOSIS — R102 Pelvic and perineal pain unspecified side: Secondary | ICD-10-CM

## 2014-09-21 DIAGNOSIS — Z79899 Other long term (current) drug therapy: Secondary | ICD-10-CM | POA: Insufficient documentation

## 2014-09-21 DIAGNOSIS — F319 Bipolar disorder, unspecified: Secondary | ICD-10-CM | POA: Insufficient documentation

## 2014-09-21 DIAGNOSIS — Z792 Long term (current) use of antibiotics: Secondary | ICD-10-CM | POA: Insufficient documentation

## 2014-09-21 DIAGNOSIS — F410 Panic disorder [episodic paroxysmal anxiety] without agoraphobia: Secondary | ICD-10-CM | POA: Insufficient documentation

## 2014-09-21 DIAGNOSIS — N739 Female pelvic inflammatory disease, unspecified: Secondary | ICD-10-CM

## 2014-09-21 DIAGNOSIS — F4310 Post-traumatic stress disorder, unspecified: Secondary | ICD-10-CM | POA: Diagnosis not present

## 2014-09-21 DIAGNOSIS — Z87442 Personal history of urinary calculi: Secondary | ICD-10-CM | POA: Insufficient documentation

## 2014-09-21 DIAGNOSIS — N390 Urinary tract infection, site not specified: Secondary | ICD-10-CM

## 2014-09-21 DIAGNOSIS — Z720 Tobacco use: Secondary | ICD-10-CM | POA: Diagnosis not present

## 2014-09-21 LAB — COMPREHENSIVE METABOLIC PANEL
ALK PHOS: 73 U/L (ref 39–117)
ALT: 22 U/L (ref 0–35)
AST: 14 U/L (ref 0–37)
Albumin: 3.9 g/dL (ref 3.5–5.2)
Anion gap: 5 (ref 5–15)
BILIRUBIN TOTAL: 0.4 mg/dL (ref 0.3–1.2)
BUN: 17 mg/dL (ref 6–23)
CHLORIDE: 109 mmol/L (ref 96–112)
CO2: 25 mmol/L (ref 19–32)
Calcium: 9 mg/dL (ref 8.4–10.5)
Creatinine, Ser: 0.75 mg/dL (ref 0.50–1.10)
GFR calc non Af Amer: >90 mL/min (ref >90)
Glucose, Bld: 93 mg/dL (ref 70–99)
POTASSIUM: 3.8 mmol/L (ref 3.5–5.1)
SODIUM: 139 mmol/L (ref 135–145)
Total Protein: 7.4 g/dL (ref 6.0–8.3)

## 2014-09-21 LAB — LIPASE, BLOOD: Lipase: 27 U/L (ref 11–59)

## 2014-09-21 LAB — CBC WITH DIFFERENTIAL/PLATELET
BASOS PCT: 0 % (ref 0–1)
Basophils Absolute: 0 10*3/uL (ref 0.0–0.1)
EOS ABS: 0.1 10*3/uL (ref 0.0–0.7)
Eosinophils Relative: 1 % (ref 0–5)
HCT: 36.8 % (ref 36.0–46.0)
Hemoglobin: 12.1 g/dL (ref 12.0–15.0)
LYMPHS ABS: 2.7 10*3/uL (ref 0.7–4.0)
Lymphocytes Relative: 33 % (ref 12–46)
MCH: 32.1 pg (ref 26.0–34.0)
MCHC: 32.9 g/dL (ref 30.0–36.0)
MCV: 97.6 fL (ref 78.0–100.0)
MONO ABS: 0.5 10*3/uL (ref 0.1–1.0)
Monocytes Relative: 6 % (ref 3–12)
NEUTROS ABS: 5 10*3/uL (ref 1.7–7.7)
Neutrophils Relative %: 60 % (ref 43–77)
Platelets: 300 10*3/uL (ref 150–400)
RBC: 3.77 MIL/uL — ABNORMAL LOW (ref 3.87–5.11)
RDW: 13.5 % (ref 11.5–15.5)
WBC: 8.4 10*3/uL (ref 4.0–10.5)

## 2014-09-21 LAB — I-STAT CG4 LACTIC ACID, ED: LACTIC ACID, VENOUS: 0.78 mmol/L (ref 0.5–2.0)

## 2014-09-21 LAB — URINE MICROSCOPIC-ADD ON

## 2014-09-21 LAB — WET PREP, GENITAL
Trich, Wet Prep: NONE SEEN
Yeast Wet Prep HPF POC: NONE SEEN

## 2014-09-21 LAB — URINALYSIS, ROUTINE W REFLEX MICROSCOPIC
BILIRUBIN URINE: NEGATIVE
Glucose, UA: NEGATIVE mg/dL
KETONES UR: NEGATIVE mg/dL
Leukocytes, UA: NEGATIVE
Nitrite: POSITIVE — AB
PROTEIN: NEGATIVE mg/dL
SPECIFIC GRAVITY, URINE: 1.015 (ref 1.005–1.030)
UROBILINOGEN UA: 0.2 mg/dL (ref 0.0–1.0)
pH: 6 (ref 5.0–8.0)

## 2014-09-21 LAB — PREGNANCY, URINE: PREG TEST UR: NEGATIVE

## 2014-09-21 MED ORDER — SODIUM CHLORIDE 0.9 % IV SOLN
1000.0000 mL | INTRAVENOUS | Status: DC
  Administered 2014-09-21: 1000 mL via INTRAVENOUS

## 2014-09-21 MED ORDER — MORPHINE SULFATE 4 MG/ML IJ SOLN
4.0000 mg | Freq: Once | INTRAMUSCULAR | Status: AC
  Administered 2014-09-21: 4 mg via INTRAVENOUS
  Filled 2014-09-21: qty 1

## 2014-09-21 MED ORDER — SODIUM CHLORIDE 0.9 % IV SOLN
1000.0000 mL | Freq: Once | INTRAVENOUS | Status: AC
  Administered 2014-09-21: 1000 mL via INTRAVENOUS

## 2014-09-21 MED ORDER — HYDROCODONE-ACETAMINOPHEN 5-325 MG PO TABS: 1.0000 | ORAL_TABLET | Freq: Four times a day (QID) | ORAL | Status: DC | PRN

## 2014-09-21 MED ORDER — DOXYCYCLINE HYCLATE 100 MG PO CAPS: 100.0000 mg | ORAL_CAPSULE | Freq: Two times a day (BID) | ORAL | Status: DC

## 2014-09-21 MED ORDER — ONDANSETRON HCL 4 MG/2ML IJ SOLN
4.0000 mg | Freq: Once | INTRAMUSCULAR | Status: AC
  Administered 2014-09-21: 4 mg via INTRAVENOUS
  Filled 2014-09-21: qty 2

## 2014-09-21 MED ORDER — DEXTROSE 5 % IV SOLN
1.0000 g | Freq: Once | INTRAVENOUS | Status: AC
  Administered 2014-09-21: 1 g via INTRAVENOUS
  Filled 2014-09-21: qty 10

## 2014-09-21 MED ORDER — AZITHROMYCIN 1 G PO PACK
1.0000 g | PACK | Freq: Once | ORAL | Status: AC
  Administered 2014-09-21: 1 g via ORAL

## 2014-09-21 MED ORDER — ONDANSETRON 4 MG PO TBDP: ORAL_TABLET | ORAL | Status: DC

## 2014-09-21 NOTE — ED Notes (Signed)
Patient reports pelvic pain, lower back pain, white vaginal discharge, swelling to vaginal area, urinary frequency, and odor to urine. States symptoms have occurred x 3 days.
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
## 2014-09-21 NOTE — ED Notes (Signed)
Unable to update vitals at this time, with [place]

## 2014-09-21 NOTE — Discharge Instructions (Signed)
Follow up with your ob-gyn md next week °

## 2014-09-21 NOTE — ED Notes (Signed)
Pt drinking water,  Advised pt earlier that she was NPO.

## 2014-09-21 NOTE — ED Provider Notes (Signed)
[place] shows hemorraghic cyst,   Will tx for std and rx with hydrocodone and zofran with follow up with gyn next week  Bethann BerkshireJoseph Adleigh Mcmasters, MD 09/21/14 1038

## 2014-09-22 LAB — HIV ANTIBODY (ROUTINE TESTING W REFLEX): HIV Screen 4th Generation wRfx: NONREACTIVE

## 2014-09-22 LAB — GC/CHLAMYDIA PROBE AMP (~~LOC~~) NOT AT ARMC
Chlamydia: NEGATIVE
Neisseria Gonorrhea: NEGATIVE

## 2014-09-23 ENCOUNTER — Emergency Department (HOSPITAL_COMMUNITY)
Admission: EM | Admit: 2014-09-23 | Discharge: 2014-09-23 | Disposition: A | Discharge status: Home / Self Care | Payer: Medicaid Other | Attending: Emergency Medicine | Admitting: Emergency Medicine

## 2014-09-23 ENCOUNTER — Encounter (HOSPITAL_COMMUNITY): Payer: Self-pay | Admitting: *Deleted

## 2014-09-23 DIAGNOSIS — Z3202 Encounter for pregnancy test, result negative: Secondary | ICD-10-CM | POA: Diagnosis not present

## 2014-09-23 DIAGNOSIS — J45909 Unspecified asthma, uncomplicated: Secondary | ICD-10-CM | POA: Diagnosis not present

## 2014-09-23 DIAGNOSIS — R1110 Vomiting, unspecified: Secondary | ICD-10-CM | POA: Diagnosis present

## 2014-09-23 DIAGNOSIS — N10 Acute tubulo-interstitial nephritis: Secondary | ICD-10-CM

## 2014-09-23 DIAGNOSIS — G8929 Other chronic pain: Secondary | ICD-10-CM | POA: Diagnosis not present

## 2014-09-23 DIAGNOSIS — Z8659 Personal history of other mental and behavioral disorders: Secondary | ICD-10-CM | POA: Insufficient documentation

## 2014-09-23 DIAGNOSIS — Z79899 Other long term (current) drug therapy: Secondary | ICD-10-CM | POA: Insufficient documentation

## 2014-09-23 DIAGNOSIS — N12 Tubulo-interstitial nephritis, not specified as acute or chronic: Secondary | ICD-10-CM | POA: Insufficient documentation

## 2014-09-23 DIAGNOSIS — Z87448 Personal history of other diseases of urinary system: Secondary | ICD-10-CM | POA: Diagnosis not present

## 2014-09-23 DIAGNOSIS — Z720 Tobacco use: Secondary | ICD-10-CM | POA: Insufficient documentation

## 2014-09-23 DIAGNOSIS — Z87442 Personal history of urinary calculi: Secondary | ICD-10-CM | POA: Insufficient documentation

## 2014-09-23 DIAGNOSIS — N730 Acute parametritis and pelvic cellulitis: Secondary | ICD-10-CM | POA: Diagnosis not present

## 2014-09-23 LAB — URINALYSIS, ROUTINE W REFLEX MICROSCOPIC
Bilirubin Urine: NEGATIVE
GLUCOSE, UA: NEGATIVE mg/dL
Hgb urine dipstick: NEGATIVE
Ketones, ur: NEGATIVE mg/dL
Leukocytes, UA: NEGATIVE
Nitrite: NEGATIVE
PH: 5.5 (ref 5.0–8.0)
Protein, ur: NEGATIVE mg/dL
Specific Gravity, Urine: >1.03 — ABNORMAL HIGH (ref 1.005–1.030)
Urobilinogen, UA: 0.2 mg/dL (ref 0.0–1.0)

## 2014-09-23 LAB — BASIC METABOLIC PANEL
Anion gap: 7 (ref 5–15)
BUN: 15 mg/dL (ref 6–23)
CO2: 22 mmol/L (ref 19–32)
Calcium: 8.9 mg/dL (ref 8.4–10.5)
Chloride: 109 mmol/L (ref 96–112)
Creatinine, Ser: 0.81 mg/dL (ref 0.50–1.10)
GFR calc Af Amer: >90 mL/min (ref >90)
GFR calc non Af Amer: >90 mL/min (ref >90)
Glucose, Bld: 90 mg/dL (ref 70–99)
Potassium: 3.6 mmol/L (ref 3.5–5.1)
Sodium: 138 mmol/L (ref 135–145)

## 2014-09-23 LAB — URINE CULTURE

## 2014-09-23 LAB — CBC WITH DIFFERENTIAL/PLATELET
Basophils Absolute: 0 10*3/uL (ref 0.0–0.1)
Basophils Relative: 0 % (ref 0–1)
EOS PCT: 1 % (ref 0–5)
Eosinophils Absolute: 0.1 10*3/uL (ref 0.0–0.7)
HCT: 40.4 % (ref 36.0–46.0)
Hemoglobin: 13.5 g/dL (ref 12.0–15.0)
LYMPHS PCT: 6 % — AB (ref 12–46)
Lymphs Abs: 0.6 10*3/uL — ABNORMAL LOW (ref 0.7–4.0)
MCH: 32.3 pg (ref 26.0–34.0)
MCHC: 33.4 g/dL (ref 30.0–36.0)
MCV: 96.7 fL (ref 78.0–100.0)
MONO ABS: 0.3 10*3/uL (ref 0.1–1.0)
MONOS PCT: 3 % (ref 3–12)
NEUTROS PCT: 90 % — AB (ref 43–77)
Neutro Abs: 9 10*3/uL — ABNORMAL HIGH (ref 1.7–7.7)
Platelets: 351 10*3/uL (ref 150–400)
RBC: 4.18 MIL/uL (ref 3.87–5.11)
RDW: 13.5 % (ref 11.5–15.5)
WBC: 10 10*3/uL (ref 4.0–10.5)

## 2014-09-23 LAB — PREGNANCY, URINE: PREG TEST UR: NEGATIVE

## 2014-09-23 MED ORDER — METOCLOPRAMIDE HCL 10 MG PO TABS: 10.0000 mg | ORAL_TABLET | Freq: Four times a day (QID) | ORAL | Status: DC | PRN

## 2014-09-23 MED ORDER — KETOROLAC TROMETHAMINE 30 MG/ML IJ SOLN
INTRAMUSCULAR | Status: AC
  Filled 2014-09-23: qty 1

## 2014-09-23 MED ORDER — DEXTROSE 5 % IV SOLN
1.0000 g | Freq: Once | INTRAVENOUS | Status: AC
  Administered 2014-09-23: 1 g via INTRAVENOUS
  Filled 2014-09-23: qty 10

## 2014-09-23 MED ORDER — SODIUM CHLORIDE 0.9 % IV BOLUS (SEPSIS)
1000.0000 mL | Freq: Once | INTRAVENOUS | Status: AC
  Administered 2014-09-23: 1000 mL via INTRAVENOUS

## 2014-09-23 MED ORDER — HYDROMORPHONE HCL 1 MG/ML IJ SOLN
1.0000 mg | Freq: Once | INTRAMUSCULAR | Status: AC
  Administered 2014-09-23: 1 mg via INTRAVENOUS
  Filled 2014-09-23: qty 1

## 2014-09-23 MED ORDER — CLINDAMYCIN PHOSPHATE 2 % VA CREA: 1.0000 | TOPICAL_CREAM | Freq: Every day | VAGINAL | Status: DC

## 2014-09-23 MED ORDER — KETOROLAC TROMETHAMINE 30 MG/ML IJ SOLN
15.0000 mg | Freq: Once | INTRAMUSCULAR | Status: AC
  Administered 2014-09-23: 15 mg via INTRAVENOUS

## 2014-09-23 NOTE — ED Notes (Signed)
Pt would like anyone who comes in to see her to have everyone leave the room before discussing anything.

## 2014-09-23 NOTE — Discharge Instructions (Signed)
If you were given medicines take as directed.  If you are on coumadin or contraceptives realize their levels and effectiveness is altered by many different medicines.  If you have any reaction (rash, tongues swelling, other) to the medicines stop taking and see a physician.   Please follow up as directed and return to the ER or see a physician for new or worsening symptoms.  Thank you. Filed Vitals:   09/23/14 1337  BP: 107/60  Pulse: 85  Temp: 98.9 F (37.2 C)  TempSrc: Oral  Resp: 15  Height: 5' (1.524 m)  Weight: 131 lb 8 oz (59.648 kg)  SpO2: 98%   See OB GYN Monday

## 2014-09-23 NOTE — ED Notes (Signed)
Pt c/o vomiting, chills and lightheadedness for past hour per pt
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
## 2014-09-24 NOTE — Progress Notes (Signed)
ED Antimicrobial Stewardship Positive Culture Follow Up   Katherine Burgess is an 34 y.o. female who [facility] with a chief complaint of  Chief ComplaintHart Carwin  Patient presents with  . Emesis    Recent Results (from the past 720 hour(s))  Wet prep, genital     Status: Abnormal   Collection Time: 09/01/14  6:00 PM  Result Value Ref Range Status   Yeast Wet Prep HPF POC NONE SEEN NONE SEEN Final   Trich, Wet Prep NONE SEEN NONE SEEN Final   Clue Cells Wet Prep HPF POC NONE SEEN NONE SEEN Final   WBC, Wet Prep HPF POC RARE (A) NONE SEEN Final  Urine culture     Status: None   Collection Time: 09/21/14  5:36 AM  Result Value Ref Range Status   Specimen Description URINE, CLEAN CATCH  Final   Special Requests NONE  Final   Colony Count   Final    >=100,000 COLONIES/ML Performed at [facility]    Culture   Final    ESCHERICHIA COLI Performed at [facility]    Report Status 09/23/2014 FINAL  Final   Organism ID, Bacteria ESCHERICHIA COLI  Final      Susceptibility   Escherichia coli - MIC*    AMPICILLIN 4 SENSITIVE Sensitive     CEFAZOLIN <=4 SENSITIVE Sensitive     CEFTRIAXONE <=1 SENSITIVE Sensitive     CIPROFLOXACIN <=0.25 SENSITIVE Sensitive     GENTAMICIN <=1 SENSITIVE Sensitive     LEVOFLOXACIN 1 SENSITIVE Sensitive     NITROFURANTOIN 32 SENSITIVE Sensitive     TOBRAMYCIN <=1 SENSITIVE Sensitive     TRIMETH/SULFA >=320 RESISTANT Resistant     PIP/TAZO <=4 SENSITIVE Sensitive     * ESCHERICHIA COLI  Wet prep, genital     Status: Abnormal   Collection Time: 09/21/14  6:55 AM  Result Value Ref Range Status   Yeast Wet Prep HPF POC NONE SEEN NONE SEEN Final   Trich, Wet Prep NONE SEEN NONE SEEN Final   Clue Cells Wet Prep HPF POC MANY (A) NONE SEEN Final   WBC, Wet Prep HPF POC RARE (A) NONE SEEN Final    [x]  Patient discharged originally without antimicrobial agent and treatment is now indicated   34 yo who came in with n/v  and abd pain. She was dx with pyelo and PID. She was given rocephin and doxy. Her urine cx cam back with e.coli. Going to give her keflex.   New antibiotic prescription:  Keflex 500mg  PO TID x10 days  ED Provider: Sarita BottomNicole Piscotta, PA  Ulyses SouthwardMinh Mostafa Yuan, PharmD Pager: [phone] Infectious Diseases Pharmacist Phone# [phone]

## 2014-09-25 ENCOUNTER — Ambulatory Visit: Payer: Medicaid Other | Admitting: Obstetrics & Gynecology

## 2014-09-26 ENCOUNTER — Ambulatory Visit: Payer: Medicaid Other | Admitting: Obstetrics & Gynecology

## 2014-09-27 ENCOUNTER — Telehealth (HOSPITAL_BASED_OUTPATIENT_CLINIC_OR_DEPARTMENT_OTHER): Payer: Self-pay | Admitting: Emergency Medicine

## 2014-09-27 NOTE — Telephone Encounter (Signed)
Post ED Visit - Positive Culture Follow-up: Successful Patient Follow-Up  Culture assessed and recommendations reviewed by: []  Katherine Burgess, Pharm.D., BCPS []  Katherine MiyamotoJeremy Burgess, Pharm.D., BCPS [x]  Katherine PillionElizabeth Burgess, Pharm.D., BCPS []  OneidaMinh Burgess, [place]arm[place]., BCPS, AAHIVP []  Estella HuskMichelle Burgess, Pharm.D., BCPS, AAHIVP []  Katherine ChristiansSamson Burgess, Pharm.D. []  Katherine Mustassie Burgess, [place]arm.D.  Positive urine culture E. Coli  []  Patient discharged without antimicrobial prescription and treatment is now indicated [x]  Organism is resistant to prescribed ED discharge antimicrobial []  Patient with positive blood cultures  Changes discussed with ED provider: Trixie DredgeEmily West PA New antibiotic prescription Keflex 500mg  po tid x 10 days Refused by patient d/t already returning to ED and being treated with IV rocephin and clindamycin  Contacted patient, date 09/27/14 1539   Katherine MullMiller, Katherine Burgess 09/27/2014, 3:37 PM

## 2014-09-28 ENCOUNTER — Emergency Department (HOSPITAL_COMMUNITY)
Admission: EM | Admit: 2014-09-28 | Discharge: 2014-09-28 | Disposition: A | Discharge status: Home / Self Care | Payer: Medicaid Other | Attending: Emergency Medicine | Admitting: Emergency Medicine

## 2014-09-28 ENCOUNTER — Encounter (HOSPITAL_COMMUNITY): Payer: Self-pay | Admitting: *Deleted

## 2014-09-28 ENCOUNTER — Other Ambulatory Visit: Payer: Self-pay

## 2014-09-28 DIAGNOSIS — Z87442 Personal history of urinary calculi: Secondary | ICD-10-CM | POA: Insufficient documentation

## 2014-09-28 DIAGNOSIS — Z720 Tobacco use: Secondary | ICD-10-CM | POA: Diagnosis not present

## 2014-09-28 DIAGNOSIS — R5383 Other fatigue: Secondary | ICD-10-CM | POA: Diagnosis present

## 2014-09-28 DIAGNOSIS — Z79899 Other long term (current) drug therapy: Secondary | ICD-10-CM | POA: Diagnosis not present

## 2014-09-28 DIAGNOSIS — Z3202 Encounter for pregnancy test, result negative: Secondary | ICD-10-CM | POA: Insufficient documentation

## 2014-09-28 DIAGNOSIS — G8929 Other chronic pain: Secondary | ICD-10-CM | POA: Diagnosis not present

## 2014-09-28 DIAGNOSIS — Z792 Long term (current) use of antibiotics: Secondary | ICD-10-CM | POA: Insufficient documentation

## 2014-09-28 DIAGNOSIS — Z8659 Personal history of other mental and behavioral disorders: Secondary | ICD-10-CM | POA: Diagnosis not present

## 2014-09-28 DIAGNOSIS — R531 Weakness: Secondary | ICD-10-CM | POA: Diagnosis not present

## 2014-09-28 DIAGNOSIS — J45909 Unspecified asthma, uncomplicated: Secondary | ICD-10-CM | POA: Insufficient documentation

## 2014-09-28 DIAGNOSIS — Z87448 Personal history of other diseases of urinary system: Secondary | ICD-10-CM | POA: Insufficient documentation

## 2014-09-28 LAB — URINALYSIS, ROUTINE W REFLEX MICROSCOPIC
Bilirubin Urine: NEGATIVE
GLUCOSE, UA: NEGATIVE mg/dL
Ketones, ur: NEGATIVE mg/dL
LEUKOCYTES UA: NEGATIVE
NITRITE: NEGATIVE
Protein, ur: NEGATIVE mg/dL
Specific Gravity, Urine: >1.03 — ABNORMAL HIGH (ref 1.005–1.030)
Urobilinogen, UA: 0.2 mg/dL (ref 0.0–1.0)
pH: 6 (ref 5.0–8.0)

## 2014-09-28 LAB — CBC
HCT: 38.2 % (ref 36.0–46.0)
Hemoglobin: 12.9 g/dL (ref 12.0–15.0)
MCH: 32.1 pg (ref 26.0–34.0)
MCHC: 33.8 g/dL (ref 30.0–36.0)
MCV: 95 fL (ref 78.0–100.0)
Platelets: 384 10*3/uL (ref 150–400)
RBC: 4.02 MIL/uL (ref 3.87–5.11)
RDW: 13 % (ref 11.5–15.5)
WBC: 6.4 10*3/uL (ref 4.0–10.5)

## 2014-09-28 LAB — COMPREHENSIVE METABOLIC PANEL
ALK PHOS: 61 U/L (ref 39–117)
ALT: 25 U/L (ref 0–35)
AST: 26 U/L (ref 0–37)
Albumin: 3.9 g/dL (ref 3.5–5.2)
Anion gap: 8 (ref 5–15)
BUN: 12 mg/dL (ref 6–23)
CHLORIDE: 109 mmol/L (ref 96–112)
CO2: 22 mmol/L (ref 19–32)
Calcium: 8.9 mg/dL (ref 8.4–10.5)
Creatinine, Ser: 0.9 mg/dL (ref 0.50–1.10)
GFR calc non Af Amer: 82 mL/min — ABNORMAL LOW (ref >90)
Glucose, Bld: 87 mg/dL (ref 70–99)
Potassium: 3.4 mmol/L — ABNORMAL LOW (ref 3.5–5.1)
SODIUM: 139 mmol/L (ref 135–145)
Total Bilirubin: 0.5 mg/dL (ref 0.3–1.2)
Total Protein: 7.5 g/dL (ref 6.0–8.3)

## 2014-09-28 LAB — URINE MICROSCOPIC-ADD ON

## 2014-09-28 NOTE — Discharge Instructions (Signed)
Increase fluids. Eat regular males, especially in the morning. Tests show you were slightly dehydrated.

## 2014-09-28 NOTE — ED Notes (Addendum)
Pt states she was seen here Sunday for UTI and began having severe diarrhea on Monday. Pt states she is feeling "weak and drained, like I am going to pass out." Pt also states dizziness at times. Right ear pain and pain to right side of head. States she noticed a little blood to her ear yesterday.
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
## 2015-01-30 ENCOUNTER — Encounter (HOSPITAL_COMMUNITY): Payer: Self-pay | Admitting: Emergency Medicine

## 2015-01-30 ENCOUNTER — Emergency Department (HOSPITAL_COMMUNITY)
Admission: EM | Admit: 2015-01-30 | Discharge: 2015-01-30 | Disposition: A | Discharge status: Home / Self Care | Payer: Medicaid Other | Attending: Emergency Medicine | Admitting: Emergency Medicine

## 2015-01-30 DIAGNOSIS — Z87442 Personal history of urinary calculi: Secondary | ICD-10-CM | POA: Diagnosis not present

## 2015-01-30 DIAGNOSIS — Z79899 Other long term (current) drug therapy: Secondary | ICD-10-CM | POA: Diagnosis not present

## 2015-01-30 DIAGNOSIS — Z87448 Personal history of other diseases of urinary system: Secondary | ICD-10-CM | POA: Insufficient documentation

## 2015-01-30 DIAGNOSIS — Z202 Contact with and (suspected) exposure to infections with a predominantly sexual mode of transmission: Secondary | ICD-10-CM

## 2015-01-30 DIAGNOSIS — N760 Acute vaginitis: Secondary | ICD-10-CM | POA: Diagnosis not present

## 2015-01-30 DIAGNOSIS — G8929 Other chronic pain: Secondary | ICD-10-CM | POA: Insufficient documentation

## 2015-01-30 DIAGNOSIS — Z720 Tobacco use: Secondary | ICD-10-CM | POA: Insufficient documentation

## 2015-01-30 DIAGNOSIS — Z8659 Personal history of other mental and behavioral disorders: Secondary | ICD-10-CM | POA: Diagnosis not present

## 2015-01-30 DIAGNOSIS — J45909 Unspecified asthma, uncomplicated: Secondary | ICD-10-CM | POA: Diagnosis not present

## 2015-01-30 DIAGNOSIS — Z3202 Encounter for pregnancy test, result negative: Secondary | ICD-10-CM | POA: Insufficient documentation

## 2015-01-30 DIAGNOSIS — B9689 Other specified bacterial agents as the cause of diseases classified elsewhere: Secondary | ICD-10-CM

## 2015-01-30 LAB — POC URINE PREG, ED
Preg Test, Ur: NEGATIVE
Preg Test, Ur: NEGATIVE

## 2015-01-30 LAB — URINALYSIS, ROUTINE W REFLEX MICROSCOPIC
Bilirubin Urine: NEGATIVE
Glucose, UA: NEGATIVE mg/dL
Hgb urine dipstick: NEGATIVE
KETONES UR: NEGATIVE mg/dL
Leukocytes, UA: NEGATIVE
Nitrite: NEGATIVE
PH: 6.5 (ref 5.0–8.0)
PROTEIN: NEGATIVE mg/dL
Specific Gravity, Urine: 1.025 (ref 1.005–1.030)
UROBILINOGEN UA: 0.2 mg/dL (ref 0.0–1.0)

## 2015-01-30 LAB — WET PREP, GENITAL
TRICH WET PREP: NONE SEEN
WBC WET PREP: NONE SEEN
Yeast Wet Prep HPF POC: NONE SEEN

## 2015-01-30 MED ORDER — TRAMADOL HCL 50 MG PO TABS: 50.0000 mg | ORAL_TABLET | Freq: Four times a day (QID) | ORAL | Status: DC | PRN

## 2015-01-30 MED ORDER — CLINDAMYCIN HCL 300 MG PO CAPS: 300.0000 mg | ORAL_CAPSULE | Freq: Two times a day (BID) | ORAL | Status: DC

## 2015-01-30 MED ORDER — AZITHROMYCIN 250 MG PO TABS
1000.0000 mg | ORAL_TABLET | Freq: Once | ORAL | Status: AC
  Administered 2015-01-30: 1000 mg via ORAL
  Filled 2015-01-30: qty 4

## 2015-01-30 MED ORDER — LIDOCAINE HCL (PF) 1 % IJ SOLN
INTRAMUSCULAR | Status: AC
  Administered 2015-01-30: 2.1 mL
  Filled 2015-01-30: qty 5

## 2015-01-30 MED ORDER — DOXYCYCLINE HYCLATE 100 MG PO CAPS: 100.0000 mg | ORAL_CAPSULE | Freq: Two times a day (BID) | ORAL | Status: DC

## 2015-01-30 MED ORDER — CEFTRIAXONE SODIUM 250 MG IJ SOLR
250.0000 mg | Freq: Once | INTRAMUSCULAR | Status: AC
  Administered 2015-01-30: 250 mg via INTRAMUSCULAR
  Filled 2015-01-30: qty 250

## 2015-01-30 NOTE — Discharge Instructions (Signed)
Bacterial Vaginosis °Bacterial vaginosis is a vaginal infection that occurs when the normal balance of bacteria in the vagina is disrupted. It results from an overgrowth of certain bacteria. This is the most common vaginal infection in women of childbearing age. Treatment is important to prevent complications, especially in pregnant women, as it can cause a premature delivery. °CAUSES  °Bacterial vaginosis is caused by an increase in harmful bacteria that are normally present in smaller amounts in the vagina. Several different kinds of bacteria can cause bacterial vaginosis. However, the reason that the condition develops is not fully understood. °RISK FACTORS °Certain activities or behaviors can put you at an increased risk of developing bacterial vaginosis, including: °· Having a new sex partner or multiple sex partners. °· Douching. °· Using an intrauterine device (IUD) for contraception. °Women do not get bacterial vaginosis from toilet seats, bedding, swimming pools, or contact with objects around them. °SIGNS AND SYMPTOMS  °Some women with bacterial vaginosis have no signs or symptoms. Common symptoms include: °· Grey vaginal discharge. °· A fishlike odor with discharge, especially after sexual intercourse. °· Itching or burning of the vagina and vulva. °· Burning or pain with urination. °DIAGNOSIS  °Your health care provider will take a medical history and examine the vagina for signs of bacterial vaginosis. A sample of vaginal fluid may be taken. Your health care provider will look at this sample under a microscope to check for bacteria and abnormal cells. A vaginal pH test may also be done.  °TREATMENT  °Bacterial vaginosis may be treated with antibiotic medicines. These may be given in the form of a pill or a vaginal cream. A second round of antibiotics may be prescribed if the condition comes back after treatment.  °HOME CARE INSTRUCTIONS  °· Only take over-the-counter or prescription medicines as  directed by your health care provider. °· If antibiotic medicine was prescribed, take it as directed. Make sure you finish it even if you start to feel better. °· Do not have sex until treatment is completed. °· Tell all sexual partners that you have a vaginal infection. They should see their health care provider and be treated if they have problems, such as a mild rash or itching. °· Practice safe sex by using condoms and only having one sex partner. °SEEK MEDICAL CARE IF:  °· Your symptoms are not improving after 3 days of treatment. °· You have increased discharge or pain. °· You have a fever. °MAKE SURE YOU:  °· Understand these instructions. °· Will watch your condition. °· Will get help right away if you are not doing well or get worse. °FOR MORE INFORMATION  °Centers for Disease Control and Prevention, Division of STD Prevention: www.cdc.gov/std °American Sexual Health Association (ASHA): www.ashastd.org  °Document Released: 06/30/2005 Document Revised: 04/20/2013 Document Reviewed: 02/09/2013 °ExitCare® Patient Information ©2015 [employer]. This information is not intended to replace advice given to you by your health care provider. Make sure you discuss any questions you have with your health care provider. ° °Safe Sex °Safe sex is about reducing the risk of giving or getting a sexually transmitted disease (STD). STDs are spread through sexual contact involving the genitals, mouth, or rectum. Some STDs can be cured and others cannot. Safe sex can also prevent unintended pregnancies.  °WHAT ARE SOME SAFE SEX PRACTICES? °· Limit your sexual activity to only one partner who is having sex with only you. °· Talk to your partner about his or her past partners, past STDs, and drug use. °·   Use a condom every time you have sexual intercourse. This includes vaginal, oral, and anal sexual activity. Both females and males should wear condoms during oral sex. Only use latex or polyurethane condoms and water-based  lubricants. Using petroleum-based lubricants or oils to lubricate a condom will weaken the condom and increase the chance that it will break. The condom should be in place from the beginning to the end of sexual activity. Wearing a condom reduces, but does not completely eliminate, your risk of getting or giving an STD. STDs can be spread by contact with infected body fluids and skin. °· Get vaccinated for hepatitis B and HPV. °· Avoid alcohol and recreational drugs, which can affect your judgment. You may forget to use a condom or participate in high-risk sex. °· For females, avoid douching after sexual intercourse. Douching can spread an infection farther into the reproductive tract. °· Check your body for signs of sores, blisters, rashes, or unusual discharge. See your health care provider if you notice any of these signs. °· Avoid sexual contact if you have symptoms of an infection or are being treated for an STD. If you or your partner has herpes, avoid sexual contact when blisters are present. Use condoms at all other times. °· If you are at risk of being infected with HIV, it is recommended that you take a prescription medicine daily to prevent HIV infection. This is called pre-exposure prophylaxis (PrEP). You are considered at risk if: °¨ You are a man who has sex with other men (MSM). °¨ You are a heterosexual man or woman who is sexually active with more than one partner. °¨ You take drugs by injection. °¨ You are sexually active with a partner who has HIV. °· Talk with your health care provider about whether you are at high risk of being infected with HIV. If you choose to begin PrEP, you should first be tested for HIV. You should then be tested every 3 months for as long as you are taking PrEP. °· See your health care provider for regular screenings, exams, and tests for other STDs. Before having sex with a new partner, each of you should be screened for STDs and should talk about the results with each  other. °WHAT ARE THE BENEFITS OF SAFE SEX?  °· There is less chance of getting or giving an STD. °· You can prevent unwanted or unintended pregnancies. °· By discussing safe sex concerns with your partner, you may increase feelings of intimacy, comfort, trust, and honesty between the two of you. °Document Released: 08/07/2004 Document Revised: 11/14/2013 Document Reviewed: 12/22/2011 °ExitCare® Patient Information ©2015 [employer]. This information is not intended to replace advice given to you by your health care provider. Make sure you discuss any questions you have with your health care provider. ° °

## 2015-01-30 NOTE — ED Notes (Signed)
Pt was having unprotected sex with female and female's girlfriend that he has STD.  Here for treatment.
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
## 2015-02-01 LAB — RPR: RPR: NONREACTIVE

## 2015-02-01 LAB — GC/CHLAMYDIA PROBE AMP (~~LOC~~) NOT AT ARMC
Chlamydia: NEGATIVE
Neisseria Gonorrhea: NEGATIVE

## 2015-02-01 LAB — HIV ANTIBODY (ROUTINE TESTING W REFLEX): HIV SCREEN 4TH GENERATION: NONREACTIVE

## 2015-06-05 ENCOUNTER — Emergency Department (HOSPITAL_COMMUNITY): Payer: Medicaid Other

## 2015-06-05 ENCOUNTER — Emergency Department (HOSPITAL_COMMUNITY)
Admission: EM | Admit: 2015-06-05 | Discharge: 2015-06-05 | Disposition: A | Discharge status: Home / Self Care | Payer: Medicaid Other | Attending: Emergency Medicine | Admitting: Emergency Medicine

## 2015-06-05 ENCOUNTER — Encounter (HOSPITAL_COMMUNITY): Payer: Self-pay | Admitting: Emergency Medicine

## 2015-06-05 DIAGNOSIS — R102 Pelvic and perineal pain: Secondary | ICD-10-CM

## 2015-06-05 DIAGNOSIS — G8929 Other chronic pain: Secondary | ICD-10-CM | POA: Insufficient documentation

## 2015-06-05 DIAGNOSIS — Z3202 Encounter for pregnancy test, result negative: Secondary | ICD-10-CM | POA: Diagnosis not present

## 2015-06-05 DIAGNOSIS — F419 Anxiety disorder, unspecified: Secondary | ICD-10-CM | POA: Diagnosis not present

## 2015-06-05 DIAGNOSIS — Z87442 Personal history of urinary calculi: Secondary | ICD-10-CM | POA: Insufficient documentation

## 2015-06-05 DIAGNOSIS — N730 Acute parametritis and pelvic cellulitis: Secondary | ICD-10-CM | POA: Diagnosis not present

## 2015-06-05 DIAGNOSIS — J45909 Unspecified asthma, uncomplicated: Secondary | ICD-10-CM | POA: Diagnosis not present

## 2015-06-05 DIAGNOSIS — F17210 Nicotine dependence, cigarettes, uncomplicated: Secondary | ICD-10-CM | POA: Diagnosis not present

## 2015-06-05 DIAGNOSIS — F329 Major depressive disorder, single episode, unspecified: Secondary | ICD-10-CM | POA: Diagnosis not present

## 2015-06-05 LAB — URINALYSIS, ROUTINE W REFLEX MICROSCOPIC
Bilirubin Urine: NEGATIVE
Glucose, UA: NEGATIVE mg/dL
Hgb urine dipstick: NEGATIVE
Ketones, ur: NEGATIVE mg/dL
LEUKOCYTES UA: NEGATIVE
Nitrite: NEGATIVE
PROTEIN: NEGATIVE mg/dL
Specific Gravity, Urine: 1.015 (ref 1.005–1.030)
pH: 5 (ref 5.0–8.0)

## 2015-06-05 LAB — WET PREP, GENITAL
CLUE CELLS WET PREP: NONE SEEN
Sperm: NONE SEEN
Trich, Wet Prep: NONE SEEN
Yeast Wet Prep HPF POC: NONE SEEN

## 2015-06-05 LAB — COMPREHENSIVE METABOLIC PANEL
ALBUMIN: 4.2 g/dL (ref 3.5–5.0)
ALT: 21 U/L (ref 14–54)
ANION GAP: 7 (ref 5–15)
AST: 20 U/L (ref 15–41)
Alkaline Phosphatase: 58 U/L (ref 38–126)
BUN: 15 mg/dL (ref 6–20)
CHLORIDE: 104 mmol/L (ref 101–111)
CO2: 26 mmol/L (ref 22–32)
Calcium: 9.1 mg/dL (ref 8.9–10.3)
Creatinine, Ser: 0.73 mg/dL (ref 0.44–1.00)
Glucose, Bld: 101 mg/dL — ABNORMAL HIGH (ref 65–99)
POTASSIUM: 3.8 mmol/L (ref 3.5–5.1)
SODIUM: 137 mmol/L (ref 135–145)
Total Bilirubin: 0.6 mg/dL (ref 0.3–1.2)
Total Protein: 7.7 g/dL (ref 6.5–8.1)

## 2015-06-05 LAB — CBC
HEMATOCRIT: 39 % (ref 36.0–46.0)
HEMOGLOBIN: 13.1 g/dL (ref 12.0–15.0)
MCH: 32.3 pg (ref 26.0–34.0)
MCHC: 33.6 g/dL (ref 30.0–36.0)
MCV: 96.3 fL (ref 78.0–100.0)
Platelets: 308 10*3/uL (ref 150–400)
RBC: 4.05 MIL/uL (ref 3.87–5.11)
RDW: 12.9 % (ref 11.5–15.5)
WBC: 6.5 10*3/uL (ref 4.0–10.5)

## 2015-06-05 LAB — HCG, SERUM, QUALITATIVE: Preg, Serum: NEGATIVE

## 2015-06-05 MED ORDER — HYDROCODONE-ACETAMINOPHEN 5-325 MG PO TABS
1.0000 | ORAL_TABLET | Freq: Once | ORAL | Status: AC
  Administered 2015-06-05: 1 via ORAL
  Filled 2015-06-05: qty 1

## 2015-06-05 MED ORDER — LIDOCAINE HCL (PF) 1 % IJ SOLN
5.0000 mL | Freq: Once | INTRAMUSCULAR | Status: AC
  Administered 2015-06-05: 0.9 mL
  Filled 2015-06-05: qty 5

## 2015-06-05 MED ORDER — ONDANSETRON 4 MG PO TBDP
4.0000 mg | ORAL_TABLET | Freq: Once | ORAL | Status: AC
  Administered 2015-06-05: 4 mg via ORAL
  Filled 2015-06-05: qty 1

## 2015-06-05 MED ORDER — IBUPROFEN 400 MG PO TABS
600.0000 mg | ORAL_TABLET | Freq: Once | ORAL | Status: AC
  Administered 2015-06-05: 600 mg via ORAL
  Filled 2015-06-05: qty 2

## 2015-06-05 MED ORDER — DOXYCYCLINE HYCLATE 100 MG PO CAPS: 100.0000 mg | ORAL_CAPSULE | Freq: Two times a day (BID) | ORAL | Status: DC

## 2015-06-05 MED ORDER — CEFTRIAXONE SODIUM 250 MG IJ SOLR
250.0000 mg | Freq: Once | INTRAMUSCULAR | Status: AC
  Administered 2015-06-05: 250 mg via INTRAMUSCULAR
  Filled 2015-06-05: qty 250

## 2015-06-05 MED ORDER — AZITHROMYCIN 250 MG PO TABS
1000.0000 mg | ORAL_TABLET | Freq: Once | ORAL | Status: AC
  Administered 2015-06-05: 1000 mg via ORAL
  Filled 2015-06-05: qty 4
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
## 2015-06-05 NOTE — ED Notes (Signed)
Pt reports lower abdominal pain,lower back pain and vaginal discharge for last several days.

## 2015-06-05 NOTE — Discharge Instructions (Signed)
Please continue to take Motrin and Tylenol as needed for pain control. Please take antibiotics as prescribed. Return without fail for worsening symptoms including fever, vomiting and unable to keep down food or fluids, worsening pain, or any other symptoms concerning to you.  Pelvic Inflammatory Disease Pelvic inflammatory disease (PID) refers to an infection in some or all of the female organs. The infection can be in the uterus, ovaries, fallopian tubes, or the surrounding tissues in the pelvis. PID can cause abdominal or pelvic pain that comes on suddenly (acute pelvic pain). PID is a serious infection because it can lead to lasting (chronic) pelvic pain or the inability to have children (infertility). CAUSES This condition is most often caused by an infection that is spread during sexual contact. However, the infection can also be caused by the normal bacteria that are found in the vaginal tissues if these bacteria travel upward into the reproductive organs. PID can also occur following:  The birth of a baby.  A miscarriage.  An abortion.  Major pelvic surgery.  The use of an intrauterine device (IUD).  A sexual assault. RISK FACTORS This condition is more likely to develop in women who:  Are younger than 36 years of age.  Are [facility].  Use nonbarrier contraception.  Have multiple sexual partners.  Have sex with someone who has symptoms of an STD (sexually transmitted disease).  Use oral contraception. At times, certain behaviors can also increase the possibility of getting PID, such as:  Using a vaginal douche.  Having an IUD in place. SYMPTOMS Symptoms of this condition include:  Abdominal or pelvic pain.  Fever.  Chills.  Abnormal vaginal discharge.  Abnormal uterine bleeding.  Unusual pain shortly after the end of a menstrual period.  Painful urination.  Pain with sexual intercourse.  Nausea and vomiting. DIAGNOSIS To diagnose  this condition, your health care provider will do a physical exam and take your medical history. A pelvic exam typically reveals great tenderness in the uterus and the surrounding pelvic tissues. You may also have tests, such as:  Lab tests, including a pregnancy test, blood tests, and urine test.  Culture tests of the vagina and cervix to check for an STD.  Ultrasound.  A laparoscopic procedure to look inside the pelvis.  Examining vaginal secretions under a microscope. TREATMENT Treatment for this condition may involve one or more approaches.  Antibiotic medicines may be prescribed to be taken by mouth.  Sexual partners may need to be treated if the infection is caused by an STD.  For more severe cases, hospitalization may be needed to give antibiotics directly into a vein through an IV tube.  Surgery may be needed if other treatments do not help, but this is rare. It may take weeks until you are completely well. If you are diagnosed with PID, you should also be checked for human immunodeficiency virus (HIV). Your health care provider may test you for infection again 3 months after treatment. You should not have unprotected sex. HOME CARE INSTRUCTIONS  Take over-the-counter and prescription medicines only as told by your health care provider.  If you were prescribed an antibiotic medicine, take it as told by your health care provider. Do not stop taking the antibiotic even if you start to feel better.  Do not have sexual intercourse until treatment is completed or as told by your health care provider. If PID is confirmed, your recent sexual partners will need treatment, especially if you had unprotected  sex.  Keep all follow-up visits as told by your health care provider. This is important. SEEK MEDICAL CARE IF:  You have increased or abnormal vaginal discharge.  Your pain does not improve.  You vomit.  You have a fever.  You cannot tolerate your medicines.  Your partner  has an STD.  You have pain when you urinate. SEEK IMMEDIATE MEDICAL CARE IF:  You have increased abdominal or pelvic pain.  You have chills.  Your symptoms are not better in 72 hours even with treatment.   This information is not intended to replace advice given to you by your health care provider. Make sure you discuss any questions you have with your health care provider.   Document Released: 06/30/2005 Document Revised: 03/21/2015 Document Reviewed: 08/07/2014 Elsevier Interactive Patient Education [employer]

## 2015-06-06 LAB — HIV ANTIBODY (ROUTINE TESTING W REFLEX): HIV Screen 4th Generation wRfx: NONREACTIVE

## 2015-06-06 LAB — RPR: RPR Ser Ql: NONREACTIVE

## 2015-06-07 LAB — GC/CHLAMYDIA PROBE AMP (~~LOC~~) NOT AT ARMC
CHLAMYDIA, DNA PROBE: NEGATIVE
Neisseria Gonorrhea: NEGATIVE

## 2015-08-06 ENCOUNTER — Encounter (HOSPITAL_COMMUNITY): Payer: Self-pay | Admitting: *Deleted

## 2015-08-06 ENCOUNTER — Emergency Department (HOSPITAL_COMMUNITY)
Admission: EM | Admit: 2015-08-06 | Discharge: 2015-08-06 | Discharge status: Against Medical Advice | Payer: Medicaid Other | Attending: Emergency Medicine | Admitting: Emergency Medicine

## 2015-08-06 DIAGNOSIS — R300 Dysuria: Secondary | ICD-10-CM | POA: Diagnosis not present

## 2015-08-06 DIAGNOSIS — J45909 Unspecified asthma, uncomplicated: Secondary | ICD-10-CM | POA: Diagnosis not present

## 2015-08-06 DIAGNOSIS — F17210 Nicotine dependence, cigarettes, uncomplicated: Secondary | ICD-10-CM | POA: Diagnosis not present

## 2015-08-06 DIAGNOSIS — Z3202 Encounter for pregnancy test, result negative: Secondary | ICD-10-CM | POA: Diagnosis not present

## 2015-08-06 DIAGNOSIS — G8929 Other chronic pain: Secondary | ICD-10-CM | POA: Insufficient documentation

## 2015-08-06 DIAGNOSIS — M545 Low back pain: Secondary | ICD-10-CM | POA: Insufficient documentation

## 2015-08-06 LAB — RAPID URINE DRUG SCREEN, HOSP PERFORMED
Amphetamines: NOT DETECTED
Barbiturates: NOT DETECTED
Benzodiazepines: NOT DETECTED
Cocaine: NOT DETECTED
OPIATES: NOT DETECTED
Tetrahydrocannabinol: NOT DETECTED

## 2015-08-06 LAB — URINALYSIS, ROUTINE W REFLEX MICROSCOPIC
BILIRUBIN URINE: NEGATIVE
Glucose, UA: NEGATIVE mg/dL
Hgb urine dipstick: NEGATIVE
KETONES UR: NEGATIVE mg/dL
Leukocytes, UA: NEGATIVE
NITRITE: NEGATIVE
PROTEIN: NEGATIVE mg/dL
Specific Gravity, Urine: >1.03 — ABNORMAL HIGH (ref 1.005–1.030)
pH: 6 (ref 5.0–8.0)

## 2015-08-06 LAB — PREGNANCY, URINE: PREG TEST UR: NEGATIVE

## 2015-08-06 NOTE — ED Notes (Signed)
Pt with orange colored urine and low back pain since this morning

## 2015-09-03 ENCOUNTER — Other Ambulatory Visit: Payer: Medicaid Other | Admitting: Obstetrics & Gynecology

## 2015-09-12 ENCOUNTER — Encounter: Payer: Self-pay | Admitting: Obstetrics & Gynecology

## 2015-09-12 ENCOUNTER — Ambulatory Visit (INDEPENDENT_AMBULATORY_CARE_PROVIDER_SITE_OTHER): Payer: Medicaid Other | Admitting: Obstetrics & Gynecology

## 2015-09-12 ENCOUNTER — Other Ambulatory Visit (HOSPITAL_COMMUNITY)
Admission: RE | Admit: 2015-09-12 | Discharge: 2015-09-12 | Disposition: A | Discharge status: Home / Self Care | Payer: Medicaid Other | Source: Ambulatory Visit | Attending: Obstetrics & Gynecology | Admitting: Obstetrics & Gynecology

## 2015-09-12 VITALS — BP 110/70 | HR 72 | Ht 60.0 in | Wt 175.4 lb

## 2015-09-12 DIAGNOSIS — Z0000 Encounter for general adult medical examination without abnormal findings: Secondary | ICD-10-CM

## 2015-09-12 DIAGNOSIS — N760 Acute vaginitis: Secondary | ICD-10-CM

## 2015-09-12 DIAGNOSIS — B9689 Other specified bacterial agents as the cause of diseases classified elsewhere: Secondary | ICD-10-CM

## 2015-09-12 DIAGNOSIS — Z01411 Encounter for gynecological examination (general) (routine) with abnormal findings: Secondary | ICD-10-CM

## 2015-09-12 DIAGNOSIS — Z1151 Encounter for screening for human papillomavirus (HPV): Secondary | ICD-10-CM | POA: Insufficient documentation

## 2015-09-12 DIAGNOSIS — A499 Bacterial infection, unspecified: Secondary | ICD-10-CM | POA: Diagnosis not present

## 2015-09-12 DIAGNOSIS — Z01419 Encounter for gynecological examination (general) (routine) without abnormal findings: Secondary | ICD-10-CM | POA: Diagnosis present

## 2015-09-12 MED ORDER — CLINDAMYCIN PHOSPHATE 100 MG VA SUPP: 100.0000 mg | Freq: Every day | VAGINAL | Status: DC
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
## 2015-09-14 LAB — CYTOLOGY - PAP

## 2015-09-27 ENCOUNTER — Ambulatory Visit (INDEPENDENT_AMBULATORY_CARE_PROVIDER_SITE_OTHER): Payer: Medicaid Other | Admitting: Obstetrics & Gynecology

## 2015-09-27 ENCOUNTER — Encounter: Payer: Self-pay | Admitting: Obstetrics & Gynecology

## 2015-09-27 VITALS — BP 100/60 | HR 72 | Wt 176.3 lb

## 2015-09-27 DIAGNOSIS — N946 Dysmenorrhea, unspecified: Secondary | ICD-10-CM | POA: Diagnosis not present

## 2015-09-27 DIAGNOSIS — N7011 Chronic salpingitis: Secondary | ICD-10-CM | POA: Diagnosis not present

## 2015-09-27 DIAGNOSIS — N941 Unspecified dyspareunia: Secondary | ICD-10-CM

## 2015-09-27 DIAGNOSIS — N920 Excessive and frequent menstruation with regular cycle: Secondary | ICD-10-CM

## 2015-09-27 DIAGNOSIS — N7093 Salpingitis and oophoritis, unspecified: Secondary | ICD-10-CM

## 2015-10-11 ENCOUNTER — Other Ambulatory Visit: Payer: Self-pay | Admitting: Obstetrics & Gynecology

## 2015-10-12 ENCOUNTER — Encounter (HOSPITAL_COMMUNITY): Admission: RE | Admit: 2015-10-12 | Payer: Medicaid Other | Source: Ambulatory Visit

## 2015-10-15 ENCOUNTER — Ambulatory Visit: Payer: Medicaid Other | Admitting: Obstetrics & Gynecology

## 2015-10-17 ENCOUNTER — Inpatient Hospital Stay (HOSPITAL_COMMUNITY)
Admission: RE | Admit: 2015-10-17 | Payer: Medicaid Other | Source: Ambulatory Visit | Admitting: Obstetrics & Gynecology

## 2015-10-17 ENCOUNTER — Encounter (HOSPITAL_COMMUNITY): Admission: RE | Payer: Self-pay | Source: Ambulatory Visit

## 2015-10-17 SURGERY — HYSTERECTOMY, ABDOMINAL
Anesthesia: General

## 2015-11-07 ENCOUNTER — Ambulatory Visit (HOSPITAL_COMMUNITY)
Admission: RE | Admit: 2015-11-07 | Discharge: 2015-11-07 | Disposition: A | Discharge status: Home / Self Care | Payer: Medicaid Other | Source: Ambulatory Visit | Attending: Internal Medicine | Admitting: Internal Medicine

## 2015-11-07 ENCOUNTER — Other Ambulatory Visit (HOSPITAL_COMMUNITY): Payer: Self-pay | Admitting: Internal Medicine

## 2015-11-07 DIAGNOSIS — R1032 Left lower quadrant pain: Secondary | ICD-10-CM | POA: Insufficient documentation

## 2015-11-07 DIAGNOSIS — R1031 Right lower quadrant pain: Secondary | ICD-10-CM

## 2015-11-07 DIAGNOSIS — K5900 Constipation, unspecified: Secondary | ICD-10-CM | POA: Diagnosis not present

## 2015-11-22 ENCOUNTER — Encounter: Payer: Self-pay | Admitting: Obstetrics & Gynecology

## 2015-11-22 ENCOUNTER — Ambulatory Visit (INDEPENDENT_AMBULATORY_CARE_PROVIDER_SITE_OTHER): Payer: Medicaid Other | Admitting: Obstetrics & Gynecology

## 2015-11-22 VITALS — BP 127/75 | HR 84 | Wt 177.8 lb

## 2015-11-22 DIAGNOSIS — N971 Female infertility of tubal origin: Secondary | ICD-10-CM | POA: Diagnosis not present

## 2015-11-22 NOTE — Progress Notes (Signed)
Patient ID: Katherine Burgess, female   DOB: 03/18/1981, 35 y.o.   MRN: [id number] History:  35 y.o. G1P0 here today for eval of AUB. Pt was seen by Dr. Despina HiddenEure who rec a hyst for AUB and pelvic pain. Pt desires a pregnancy.  She is here for a second opinion to see if she should proceed with the hyst of try to conceive.  She has questions regarding her prev surgery and if her fallopian tube was actually damaged.   The following portions of the patient's history were reviewed and updated as appropriate: allergies, current medications, past family history, past medical history, past social history, past surgical history and problem list.  Review of Systems:  Pertinent items are noted in HPI.  Objective:  Physical Exam Blood pressure 127/75, pulse 84, weight 177 lb 12.8 oz (80.65 kg). Gen: NAD Abd: Soft, nontender and nondistended Pelvic: not done  Labs and Imaging Dg Abd 2 Views  11/07/2015  CLINICAL DATA:  Lower abdominal pain for 1 month, initial encounter EXAM: ABDOMEN - 2 VIEW COMPARISON:  None. FINDINGS: Scattered large and small bowel gas is noted. Fecal material is noted throughout the colon consistent with a degree of constipation. No free air is seen. No abnormal mass or abnormal calcifications are noted. No acute bony abnormality is seen. IMPRESSION: Constipation.  No other focal abnormality is noted. Electronically Signed   By: Alcide CleverMark  Lukens M.D.   On: 11/07/2015 13:19   09/28/2013 Diagnosis Fallopian tube, right - COMPLETE CROSS SECTION OF DILATED FALLOPIAN TUBAL TISSUE WITH PARATUBAL CYST, CONSISTENT WITH HYDROSALPINX. - NO ATYPIA OR MALIGNANCY.  Assessment & Plan:   HSG to eval for tubal blockage on left side F/u prn results of HSG D/w pt IVF and what it entails and potential costs (rough estimates for information only)   Reviewed surg path from prior procedure and op note to confirm for pt that her fallopian tube was indeed damaged. Pt to decide on hyst based on HSG. 20min in  face to face discussion with pt.  Jiyah Torpey L. Harraway-Smith, M.D., Evern CoreFACOG
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
## 2016-03-25 ENCOUNTER — Other Ambulatory Visit: Payer: Self-pay | Admitting: *Deleted

## 2016-03-25 DIAGNOSIS — N971 Female infertility of tubal origin: Secondary | ICD-10-CM

## 2016-03-27 ENCOUNTER — Ambulatory Visit (HOSPITAL_COMMUNITY): Admission: RE | Admit: 2016-03-27 | Payer: No Typology Code available for payment source | Source: Ambulatory Visit

## 2016-03-27 ENCOUNTER — Encounter (INDEPENDENT_AMBULATORY_CARE_PROVIDER_SITE_OTHER): Payer: Self-pay

## 2016-09-29 ENCOUNTER — Encounter (HOSPITAL_COMMUNITY): Payer: Self-pay | Admitting: *Deleted

## 2016-09-29 ENCOUNTER — Emergency Department (HOSPITAL_COMMUNITY)
Admission: EM | Admit: 2016-09-29 | Discharge: 2016-09-29 | Disposition: A | Discharge status: Home / Self Care | Payer: Medicaid Other | Attending: Emergency Medicine | Admitting: Emergency Medicine

## 2016-09-29 DIAGNOSIS — F17210 Nicotine dependence, cigarettes, uncomplicated: Secondary | ICD-10-CM | POA: Insufficient documentation

## 2016-09-29 DIAGNOSIS — E279 Disorder of adrenal gland, unspecified: Secondary | ICD-10-CM

## 2016-09-29 DIAGNOSIS — J45909 Unspecified asthma, uncomplicated: Secondary | ICD-10-CM | POA: Diagnosis not present

## 2016-09-29 DIAGNOSIS — R102 Pelvic and perineal pain: Secondary | ICD-10-CM | POA: Diagnosis present

## 2016-09-29 DIAGNOSIS — E278 Other specified disorders of adrenal gland: Secondary | ICD-10-CM | POA: Insufficient documentation

## 2016-09-29 HISTORY — DX: Unspecified injury of head, initial encounter: S09.90XA

## 2016-09-29 LAB — URINALYSIS, ROUTINE W REFLEX MICROSCOPIC
Bilirubin Urine: NEGATIVE
Glucose, UA: NEGATIVE mg/dL
Hgb urine dipstick: NEGATIVE
Ketones, ur: NEGATIVE mg/dL
LEUKOCYTES UA: NEGATIVE
NITRITE: NEGATIVE
PH: 5 (ref 5.0–8.0)
Protein, ur: NEGATIVE mg/dL
SPECIFIC GRAVITY, URINE: 1.019 (ref 1.005–1.030)

## 2016-09-29 LAB — WET PREP, GENITAL
SPERM: NONE SEEN
TRICH WET PREP: NONE SEEN
Yeast Wet Prep HPF POC: NONE SEEN

## 2016-09-29 LAB — PREGNANCY, URINE: Preg Test, Ur: NEGATIVE

## 2016-09-29 MED ORDER — DOXYCYCLINE HYCLATE 100 MG PO TABS
100.0000 mg | ORAL_TABLET | Freq: Once | ORAL | Status: AC
  Administered 2016-09-29: 100 mg via ORAL
  Filled 2016-09-29: qty 1

## 2016-09-29 MED ORDER — DOXYCYCLINE HYCLATE 100 MG PO CAPS: 100.0000 mg | ORAL_CAPSULE | Freq: Two times a day (BID) | ORAL | 0 refills | Status: DC

## 2016-09-29 MED ORDER — OXYCODONE-ACETAMINOPHEN 5-325 MG PO TABS: 1.0000 | ORAL_TABLET | ORAL | 0 refills | Status: DC | PRN

## 2016-09-29 MED ORDER — OXYCODONE-ACETAMINOPHEN 5-325 MG PO TABS
1.0000 | ORAL_TABLET | Freq: Once | ORAL | Status: AC
  Administered 2016-09-29: 1 via ORAL
  Filled 2016-09-29: qty 1

## 2016-09-29 MED ORDER — FLUCONAZOLE 150 MG PO TABS: 150.0000 mg | ORAL_TABLET | Freq: Every day | ORAL | 1 refills | Status: DC

## 2016-09-29 MED ORDER — LIDOCAINE HCL (PF) 1 % IJ SOLN
INTRAMUSCULAR | Status: AC
  Filled 2016-09-29: qty 5

## 2016-09-29 MED ORDER — CEFTRIAXONE SODIUM 250 MG IJ SOLR
250.0000 mg | Freq: Once | INTRAMUSCULAR | Status: AC
  Administered 2016-09-29: 250 mg via INTRAMUSCULAR
  Filled 2016-09-29: qty 250

## 2016-09-29 MED ORDER — AZITHROMYCIN 1 G PO PACK
1.0000 g | PACK | Freq: Once | ORAL | Status: AC
  Administered 2016-09-29: 1 g via ORAL
  Filled 2016-09-29: qty 1

## 2016-09-29 NOTE — ED Notes (Signed)
Patient given discharge instruction, verbalized understand. Patient ambulatory out of the department.  

## 2016-09-29 NOTE — ED Notes (Signed)
MD at the bedside  

## 2016-09-29 NOTE — ED Notes (Signed)
Supplies set up at bedside.  

## 2016-09-29 NOTE — ED Triage Notes (Signed)
Pt c/o lower abd pain that started over a week ago becoming worse, [facility] 09/04/2016, states that she was given two antibiotics but did not finish the medication due to it making her nausea,
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
## 2016-09-29 NOTE — Discharge Instructions (Addendum)
The radiologist who read your CT scan from February 22 recommended you have a non-emergent MRI scan to better characterize adrenal nodules he saw on the scan. Your primary care provider or your gynecologist can arrange for the scan.

## 2016-09-30 LAB — RPR: RPR: NONREACTIVE

## 2016-09-30 LAB — GC/CHLAMYDIA PROBE AMP (~~LOC~~) NOT AT ARMC
Chlamydia: NEGATIVE
Neisseria Gonorrhea: NEGATIVE

## 2016-09-30 LAB — HIV ANTIBODY (ROUTINE TESTING W REFLEX): HIV SCREEN 4TH GENERATION: NONREACTIVE

## 2016-12-17 ENCOUNTER — Emergency Department
Admission: EM | Admit: 2016-12-17 | Discharge: 2016-12-17 | Disposition: A | Discharge status: Home / Self Care | Payer: Medicaid Other | Attending: Emergency Medicine | Admitting: Emergency Medicine

## 2016-12-17 DIAGNOSIS — L02416 Cutaneous abscess of left lower limb: Secondary | ICD-10-CM | POA: Diagnosis present

## 2016-12-17 DIAGNOSIS — R109 Unspecified abdominal pain: Secondary | ICD-10-CM | POA: Insufficient documentation

## 2016-12-17 DIAGNOSIS — L0291 Cutaneous abscess, unspecified: Secondary | ICD-10-CM

## 2016-12-17 DIAGNOSIS — J45909 Unspecified asthma, uncomplicated: Secondary | ICD-10-CM | POA: Diagnosis not present

## 2016-12-17 LAB — URINALYSIS, COMPLETE (UACMP) WITH MICROSCOPIC
BILIRUBIN URINE: NEGATIVE
Bacteria, UA: NONE SEEN
GLUCOSE, UA: NEGATIVE mg/dL
KETONES UR: NEGATIVE mg/dL
Leukocytes, UA: NEGATIVE
Nitrite: NEGATIVE
PROTEIN: NEGATIVE mg/dL
Specific Gravity, Urine: 1.019 (ref 1.005–1.030)
pH: 6 (ref 5.0–8.0)

## 2016-12-17 LAB — COMPREHENSIVE METABOLIC PANEL
ALT: 25 U/L (ref 14–54)
ANION GAP: 5 (ref 5–15)
AST: 26 U/L (ref 15–41)
Albumin: 4 g/dL (ref 3.5–5.0)
Alkaline Phosphatase: 68 U/L (ref 38–126)
BUN: 13 mg/dL (ref 6–20)
CHLORIDE: 106 mmol/L (ref 101–111)
CO2: 27 mmol/L (ref 22–32)
Calcium: 8.6 mg/dL — ABNORMAL LOW (ref 8.9–10.3)
Creatinine, Ser: 1.05 mg/dL — ABNORMAL HIGH (ref 0.44–1.00)
GFR calc non Af Amer: >60 mL/min (ref >60)
Glucose, Bld: 87 mg/dL (ref 65–99)
POTASSIUM: 4.1 mmol/L (ref 3.5–5.1)
Sodium: 138 mmol/L (ref 135–145)
Total Bilirubin: 0.3 mg/dL (ref 0.3–1.2)
Total Protein: 7.6 g/dL (ref 6.5–8.1)

## 2016-12-17 LAB — LIPASE, BLOOD: LIPASE: 33 U/L (ref 11–51)

## 2016-12-17 LAB — CBC
HCT: 36.7 % (ref 35.0–47.0)
HEMOGLOBIN: 12.7 g/dL (ref 12.0–16.0)
MCH: 32.5 pg (ref 26.0–34.0)
MCHC: 34.6 g/dL (ref 32.0–36.0)
MCV: 93.7 fL (ref 80.0–100.0)
Platelets: 290 10*3/uL (ref 150–440)
RBC: 3.91 MIL/uL (ref 3.80–5.20)
RDW: 13.2 % (ref 11.5–14.5)
WBC: 5.1 10*3/uL (ref 3.6–11.0)

## 2016-12-17 LAB — POCT PREGNANCY, URINE: Preg Test, Ur: NEGATIVE

## 2016-12-17 MED ORDER — OXYCODONE-ACETAMINOPHEN 5-325 MG PO TABS
2.0000 | ORAL_TABLET | Freq: Once | ORAL | Status: AC
  Administered 2016-12-17: 2 via ORAL
  Filled 2016-12-17: qty 2

## 2016-12-17 MED ORDER — MUPIROCIN 2 % EX OINT: TOPICAL_OINTMENT | CUTANEOUS | 0 refills | Status: DC

## 2016-12-17 MED ORDER — LIDOCAINE HCL (PF) 1 % IJ SOLN
5.0000 mL | Freq: Once | INTRAMUSCULAR | Status: AC
  Administered 2016-12-17: 5 mL
  Filled 2016-12-17 (×2): qty 5

## 2016-12-17 MED ORDER — OXYCODONE-ACETAMINOPHEN 5-325 MG PO TABS: 1.0000 | ORAL_TABLET | Freq: Four times a day (QID) | ORAL | 0 refills | Status: DC | PRN

## 2016-12-17 MED ORDER — LINACLOTIDE 145 MCG PO CAPS: 145.0000 ug | ORAL_CAPSULE | Freq: Every day | ORAL | 1 refills | Status: DC
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
## 2016-12-17 NOTE — ED Triage Notes (Signed)
Pt reports abdominal pain X 2 days, abscess to inner left groin x 1 week. Pt alert and oriented X4, active, cooperative, pt in NAD. RR even and unlabored, color WNL.

## 2017-02-18 ENCOUNTER — Encounter (HOSPITAL_COMMUNITY): Payer: Self-pay | Admitting: *Deleted

## 2017-02-18 ENCOUNTER — Emergency Department (HOSPITAL_COMMUNITY)
Admission: EM | Admit: 2017-02-18 | Discharge: 2017-02-19 | Disposition: A | Discharge status: Other Acute Inpatient Hospital | Payer: Medicaid Other | Attending: Emergency Medicine | Admitting: Emergency Medicine

## 2017-02-18 DIAGNOSIS — R45851 Suicidal ideations: Secondary | ICD-10-CM | POA: Diagnosis not present

## 2017-02-18 DIAGNOSIS — R4182 Altered mental status, unspecified: Secondary | ICD-10-CM | POA: Insufficient documentation

## 2017-02-18 DIAGNOSIS — F329 Major depressive disorder, single episode, unspecified: Secondary | ICD-10-CM | POA: Diagnosis not present

## 2017-02-18 DIAGNOSIS — Z79899 Other long term (current) drug therapy: Secondary | ICD-10-CM | POA: Diagnosis not present

## 2017-02-18 DIAGNOSIS — F17210 Nicotine dependence, cigarettes, uncomplicated: Secondary | ICD-10-CM | POA: Diagnosis not present

## 2017-02-18 DIAGNOSIS — J45909 Unspecified asthma, uncomplicated: Secondary | ICD-10-CM | POA: Insufficient documentation

## 2017-02-18 DIAGNOSIS — F32A Depression, unspecified: Secondary | ICD-10-CM

## 2017-02-18 LAB — I-STAT BETA HCG BLOOD, ED (MC, WL, AP ONLY): I-stat hCG, quantitative: <5 m[IU]/mL (ref <5)

## 2017-02-18 LAB — COMPREHENSIVE METABOLIC PANEL
ALBUMIN: 4 g/dL (ref 3.5–5.0)
ALK PHOS: 64 U/L (ref 38–126)
ALT: 37 U/L (ref 14–54)
AST: 20 U/L (ref 15–41)
Anion gap: 8 (ref 5–15)
BUN: 6 mg/dL (ref 6–20)
CALCIUM: 9.1 mg/dL (ref 8.9–10.3)
CHLORIDE: 104 mmol/L (ref 101–111)
CO2: 23 mmol/L (ref 22–32)
CREATININE: 0.84 mg/dL (ref 0.44–1.00)
GFR calc non Af Amer: >60 mL/min (ref >60)
GLUCOSE: 89 mg/dL (ref 65–99)
Potassium: 3.9 mmol/L (ref 3.5–5.1)
SODIUM: 135 mmol/L (ref 135–145)
Total Bilirubin: 0.4 mg/dL (ref 0.3–1.2)
Total Protein: 7.1 g/dL (ref 6.5–8.1)

## 2017-02-18 LAB — RAPID URINE DRUG SCREEN, HOSP PERFORMED
AMPHETAMINES: NOT DETECTED
Barbiturates: NOT DETECTED
Benzodiazepines: NOT DETECTED
Cocaine: NOT DETECTED
OPIATES: NOT DETECTED
TETRAHYDROCANNABINOL: NOT DETECTED

## 2017-02-18 LAB — ACETAMINOPHEN LEVEL: Acetaminophen (Tylenol), Serum: <10 ug/mL — ABNORMAL LOW (ref 10–30)

## 2017-02-18 LAB — CBC
HCT: 40.1 % (ref 36.0–46.0)
Hemoglobin: 13.4 g/dL (ref 12.0–15.0)
MCH: 31.4 pg (ref 26.0–34.0)
MCHC: 33.4 g/dL (ref 30.0–36.0)
MCV: 93.9 fL (ref 78.0–100.0)
Platelets: 371 10*3/uL (ref 150–400)
RBC: 4.27 MIL/uL (ref 3.87–5.11)
RDW: 12.9 % (ref 11.5–15.5)
WBC: 8.6 10*3/uL (ref 4.0–10.5)

## 2017-02-18 LAB — ETHANOL: Alcohol, Ethyl (B): <5 mg/dL (ref <5)

## 2017-02-18 LAB — SALICYLATE LEVEL: Salicylate Lvl: <7 mg/dL (ref 2.8–30.0)

## 2017-02-18 MED ORDER — LINACLOTIDE 145 MCG PO CAPS
145.0000 ug | ORAL_CAPSULE | Freq: Every day | ORAL | Status: DC
  Filled 2017-02-18 (×3): qty 1

## 2017-02-18 MED ORDER — CITALOPRAM HYDROBROMIDE 10 MG PO TABS
40.0000 mg | ORAL_TABLET | Freq: Every day | ORAL | Status: DC
  Administered 2017-02-18 – 2017-02-19 (×2): 40 mg via ORAL
  Filled 2017-02-18 (×2): qty 4

## 2017-02-18 MED ORDER — HYDROXYZINE HCL 25 MG PO TABS: 25.0000 mg | ORAL_TABLET | Freq: Three times a day (TID) | ORAL | Status: DC | PRN

## 2017-02-18 MED ORDER — MUPIROCIN 2 % EX OINT
TOPICAL_OINTMENT | Freq: Every day | CUTANEOUS | Status: DC
  Filled 2017-02-18 (×2): qty 22

## 2017-02-18 MED ORDER — CLONAZEPAM 0.5 MG PO TABS
0.5000 mg | ORAL_TABLET | Freq: Three times a day (TID) | ORAL | Status: DC | PRN
  Administered 2017-02-18 – 2017-02-19 (×3): 0.5 mg via ORAL
  Filled 2017-02-18 (×3): qty 1
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
## 2017-02-18 NOTE — ED Notes (Signed)
Pt requesting home medications, explained to pt [facility] medications not [facility], this RN to speak with Dr. Ethelda ChickJacubowitz. Pt verbalized understanding.

## 2017-02-18 NOTE — ED Notes (Signed)
Pt reports she has not eaten all day. Pt given saltine crackers and graham crackers until dinner arrives.

## 2017-02-18 NOTE — ED Notes (Signed)
Staffing said 15:00 before we get a sitter and regular tray ordered
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
## 2017-02-18 NOTE — ED Notes (Signed)
Dinner tray ordered.

## 2017-02-18 NOTE — ED Notes (Signed)
TTS in progress 

## 2017-02-18 NOTE — ED Notes (Signed)
Dinner tray at bedside, onions on tray despite [facility]. Onions removed from tray. [place] fries missing, this RN requested french fries be sent to pt. Pt ambulatory to restroom with steady gait.

## 2017-02-18 NOTE — ED Notes (Signed)
F/u with service response. Order received.  

## 2017-02-18 NOTE — ED Triage Notes (Signed)
Pt reports having racing thoughts, hearing voices and thoughts of hurting herself but denies any attempts to harm herself pta. Reports taking all meds as prescribed.

## 2017-02-19 ENCOUNTER — Inpatient Hospital Stay (HOSPITAL_COMMUNITY)
Admission: AD | Admit: 2017-02-19 | Discharge: 2017-02-23 | DRG: 885 | Disposition: A | Discharge status: Home / Self Care | Payer: Medicaid Other | Source: Intra-hospital | Attending: Psychiatry | Admitting: Psychiatry

## 2017-02-19 DIAGNOSIS — F333 Major depressive disorder, recurrent, severe with psychotic symptoms: Secondary | ICD-10-CM | POA: Diagnosis not present

## 2017-02-19 DIAGNOSIS — R45851 Suicidal ideations: Secondary | ICD-10-CM | POA: Diagnosis present

## 2017-02-19 DIAGNOSIS — Z716 Tobacco abuse counseling: Secondary | ICD-10-CM

## 2017-02-19 DIAGNOSIS — Z635 Disruption of family by separation and divorce: Secondary | ICD-10-CM | POA: Diagnosis not present

## 2017-02-19 DIAGNOSIS — F1290 Cannabis use, unspecified, uncomplicated: Secondary | ICD-10-CM | POA: Diagnosis not present

## 2017-02-19 DIAGNOSIS — Z883 Allergy status to other anti-infective agents status: Secondary | ICD-10-CM | POA: Diagnosis not present

## 2017-02-19 DIAGNOSIS — R4182 Altered mental status, unspecified: Secondary | ICD-10-CM | POA: Diagnosis not present

## 2017-02-19 DIAGNOSIS — F1490 Cocaine use, unspecified, uncomplicated: Secondary | ICD-10-CM | POA: Diagnosis not present

## 2017-02-19 DIAGNOSIS — Z91410 Personal history of adult physical and sexual abuse: Secondary | ICD-10-CM | POA: Diagnosis not present

## 2017-02-19 DIAGNOSIS — F4310 Post-traumatic stress disorder, unspecified: Secondary | ICD-10-CM | POA: Diagnosis present

## 2017-02-19 DIAGNOSIS — Z636 Dependent relative needing care at home: Secondary | ICD-10-CM | POA: Diagnosis not present

## 2017-02-19 DIAGNOSIS — F17210 Nicotine dependence, cigarettes, uncomplicated: Secondary | ICD-10-CM | POA: Diagnosis not present

## 2017-02-19 DIAGNOSIS — F1211 Cannabis abuse, in remission: Secondary | ICD-10-CM | POA: Diagnosis not present

## 2017-02-19 DIAGNOSIS — F29 Unspecified psychosis not due to a substance or known physiological condition: Secondary | ICD-10-CM | POA: Diagnosis not present

## 2017-02-19 DIAGNOSIS — L0292 Furuncle, unspecified: Secondary | ICD-10-CM | POA: Diagnosis not present

## 2017-02-19 DIAGNOSIS — F410 Panic disorder [episodic paroxysmal anxiety] without agoraphobia: Secondary | ICD-10-CM | POA: Diagnosis not present

## 2017-02-19 DIAGNOSIS — Z91018 Allergy to other foods: Secondary | ICD-10-CM

## 2017-02-19 DIAGNOSIS — Z888 Allergy status to other drugs, medicaments and biological substances status: Secondary | ICD-10-CM | POA: Diagnosis not present

## 2017-02-19 DIAGNOSIS — F1411 Cocaine abuse, in remission: Secondary | ICD-10-CM | POA: Diagnosis not present

## 2017-02-19 DIAGNOSIS — Z91010 Allergy to peanuts: Secondary | ICD-10-CM

## 2017-02-19 DIAGNOSIS — F22 Delusional disorders: Secondary | ICD-10-CM | POA: Diagnosis not present

## 2017-02-19 DIAGNOSIS — G4700 Insomnia, unspecified: Secondary | ICD-10-CM | POA: Diagnosis not present

## 2017-02-19 DIAGNOSIS — R5381 Other malaise: Secondary | ICD-10-CM | POA: Diagnosis not present

## 2017-02-19 DIAGNOSIS — R634 Abnormal weight loss: Secondary | ICD-10-CM | POA: Diagnosis not present

## 2017-02-19 DIAGNOSIS — R440 Auditory hallucinations: Secondary | ICD-10-CM | POA: Diagnosis not present

## 2017-02-19 DIAGNOSIS — R5383 Other fatigue: Secondary | ICD-10-CM | POA: Diagnosis not present

## 2017-02-19 DIAGNOSIS — Z79899 Other long term (current) drug therapy: Secondary | ICD-10-CM | POA: Diagnosis not present

## 2017-02-19 DIAGNOSIS — F419 Anxiety disorder, unspecified: Secondary | ICD-10-CM | POA: Diagnosis not present

## 2017-02-19 MED ORDER — ACETAMINOPHEN 325 MG PO TABS: 650.0000 mg | ORAL_TABLET | Freq: Four times a day (QID) | ORAL | Status: DC | PRN

## 2017-02-19 MED ORDER — HALOPERIDOL 5 MG PO TABS
5.0000 mg | ORAL_TABLET | Freq: Four times a day (QID) | ORAL | Status: DC | PRN
  Administered 2017-02-19: 5 mg via ORAL
  Filled 2017-02-19: qty 1

## 2017-02-19 MED ORDER — HYDROXYZINE HCL 25 MG PO TABS
25.0000 mg | ORAL_TABLET | Freq: Three times a day (TID) | ORAL | Status: DC | PRN
  Administered 2017-02-20 – 2017-02-22 (×3): 25 mg via ORAL
  Filled 2017-02-19 (×5): qty 1

## 2017-02-19 MED ORDER — CLONAZEPAM 0.5 MG PO TABS
0.5000 mg | ORAL_TABLET | Freq: Three times a day (TID) | ORAL | Status: DC | PRN
  Administered 2017-02-20 – 2017-02-23 (×7): 0.5 mg via ORAL
  Filled 2017-02-19 (×7): qty 1

## 2017-02-19 MED ORDER — ALUM & MAG HYDROXIDE-SIMETH 200-200-20 MG/5ML PO SUSP
30.0000 mL | ORAL | Status: DC | PRN
  Administered 2017-02-21: 30 mL via ORAL
  Filled 2017-02-19: qty 30

## 2017-02-19 MED ORDER — HALOPERIDOL LACTATE 5 MG/ML IJ SOLN: 5.0000 mg | Freq: Four times a day (QID) | INTRAMUSCULAR | Status: DC | PRN

## 2017-02-19 MED ORDER — MAGNESIUM HYDROXIDE 400 MG/5ML PO SUSP: 30.0000 mL | Freq: Every day | ORAL | Status: DC | PRN

## 2017-02-19 MED ORDER — CITALOPRAM HYDROBROMIDE 40 MG PO TABS
40.0000 mg | ORAL_TABLET | Freq: Every day | ORAL | Status: DC
  Administered 2017-02-20 – 2017-02-23 (×4): 40 mg via ORAL
  Filled 2017-02-19 (×5): qty 1

## 2017-02-19 NOTE — ED Notes (Signed)
Pt given meal tray.

## 2017-02-19 NOTE — ED Notes (Signed)
Pt requesting to speak with TTS again. [facility] with [facility]

## 2017-02-19 NOTE — ED Notes (Signed)
Breakfast order sent via email.

## 2017-02-19 NOTE — ED Notes (Signed)
Pt belongings inventoried by Amil AmenJulia, Charity fundraiserN. Valuables sent to security, pt belongings inventory sheet signed by Amil AmenJulia, RN and verified with pt signature. One labeled pt belongings bag located in LOCKER #5.

## 2017-02-19 NOTE — ED Notes (Signed)
[facility]. Explained to pt that this was not our policy and that we would provide transportation. Pt requesting to leave. Explained to pt that [facility] and would be going soon. Encouraged pt to wait to speak with MD about condition and possible transfer. Offered phone for pt to call family after she stated "my family doesn't even know where I am". Pt declined.

## 2017-02-19 NOTE — ED Notes (Signed)
Pt refused ordered meal tray. This Clinical research associatewriter placed new lunch tray order.

## 2017-02-19 NOTE — ED Provider Notes (Signed)
Patient feels that she does not require admission. Behavioral health as arranged for admission at [facility] at 8 PM of bed will open up. Patient would like to talk to the counselor again. We will place a call to talk to the counselor again.  However the patient does require admission she is willing to do it voluntarily. She does not want to be IVC.   Vanetta MuldersZackowski, Elfriede Bonini, MD 02/19/17 [phone]

## 2017-02-19 NOTE — ED Notes (Signed)
[facility] and she states she will speak with patient over the phone prior to coming.

## 2017-02-19 NOTE — ED Notes (Addendum)
[facility] called stating that pt will have room around 8 pm tonight. [facility] also reports they want pt. Started on her haldol prior to coming to their facility.

## 2017-02-19 NOTE — Plan of Care (Signed)
Problem: Safety: Goal: Periods of time without injury will increase Outcome: Progressing Patient is on q15 minute safety checks and low fall risk precautions. Patient contracts for safety on the unit.

## 2017-02-19 NOTE — ED Notes (Signed)
Meal tray given 

## 2017-02-19 NOTE — Progress Notes (Signed)
Nursing Progress Note [id number]  D) Patient presents with depressed mood. Patient is minimal with Clinical research associatewriter and appears tired. Patient is adjusting to the unit without any questions or concerns for writer. Patient is keeping to her room but has showered. Patient contracts for safety on the unit.  A) Emotional support given. 1:1 interaction and active listening provided.  Snacks and fluids provided. Opportunities for questions or concerns presented to patient. Labs, vital signs and patient behavior monitored throughout shift. Patient safety maintained with q15 min safety checks. Low fall risk precautions in place and reviewed with patient; patient verbalized understanding.  R) Patient receptive to interaction with nurse. Patient remains safe on the unit at this time. Patient is resting in bed without complaints. Will continue to monitor.

## 2017-02-19 NOTE — Progress Notes (Signed)
CSW contacted by [facility] Nurse, Asher MuirJamie who is concerned because patient is getting agitated and wants to speak to the therapist that did her assessment.  That therapist was not available so CSW offered to talk to pt.  CSW introduced herself to pt and offered to answer pt's questions.  Pt stated, "I've been here for a long time and I don't know what is going on.  What's going on?"  CSW explained that pt had been assessed as needing inpatient treatment and that [facility].  Pt asked how long she would need to be in treatment and CSW explained that pt stay an average of 5-7 days in order to get their medication and symptoms stabilized.  Pt stated, "The doctor told me two days". CSW repeated the average los for patients noting that some people stay shorter times and some stay longer.  Pt then asked about the safety of her [facility].  CSW reassured patient that if her car was in the parking lot there is security.    Pt. Expressed that she is amenable to come to treatment voluntarily.  CSW then spoke [facility] and suggested that EDP consider IVCing pt if she became agitated and continues to want to leave.  Timmothy EulerJean T. Kaylyn LimSutter, MSW, LCSWA Disposition Clinical Social Work [phone] (cell) [phone] (office)

## 2017-02-19 NOTE — ED Notes (Signed)
[facility].

## 2017-02-19 NOTE — ED Notes (Signed)
Regular Diet has been ordered for Dinner. 

## 2017-02-19 NOTE — ED Notes (Signed)
MD Zackowski in room speaking with patient.

## 2017-02-19 NOTE — ED Provider Notes (Signed)
Pt seen by Dr Benita StabileZacokowski a few hours ago.  Pt is medically stable for transfer   Linwood DibblesKnapp, Jasani Lengel, MD 02/19/17 1910

## 2017-02-19 NOTE — ED Notes (Signed)
Notified pharmacy of missing 1200 medication linaclotide

## 2017-02-20 ENCOUNTER — Encounter (HOSPITAL_COMMUNITY): Payer: Self-pay | Admitting: *Deleted

## 2017-02-20 DIAGNOSIS — F17210 Nicotine dependence, cigarettes, uncomplicated: Secondary | ICD-10-CM

## 2017-02-20 DIAGNOSIS — R45851 Suicidal ideations: Secondary | ICD-10-CM

## 2017-02-20 DIAGNOSIS — F333 Major depressive disorder, recurrent, severe with psychotic symptoms: Principal | ICD-10-CM

## 2017-02-20 DIAGNOSIS — R5381 Other malaise: Secondary | ICD-10-CM

## 2017-02-20 DIAGNOSIS — F29 Unspecified psychosis not due to a substance or known physiological condition: Secondary | ICD-10-CM

## 2017-02-20 DIAGNOSIS — Z636 Dependent relative needing care at home: Secondary | ICD-10-CM

## 2017-02-20 DIAGNOSIS — Z91410 Personal history of adult physical and sexual abuse: Secondary | ICD-10-CM

## 2017-02-20 DIAGNOSIS — Z635 Disruption of family by separation and divorce: Secondary | ICD-10-CM

## 2017-02-20 DIAGNOSIS — R440 Auditory hallucinations: Secondary | ICD-10-CM

## 2017-02-20 DIAGNOSIS — R5383 Other fatigue: Secondary | ICD-10-CM

## 2017-02-20 LAB — LIPID PANEL
CHOL/HDL RATIO: 3.8 ratio
Cholesterol: 152 mg/dL (ref 0–200)
HDL: 40 mg/dL — AB (ref >40)
LDL CALC: 95 mg/dL (ref 0–99)
TRIGLYCERIDES: 84 mg/dL (ref <150)
VLDL: 17 mg/dL (ref 0–40)

## 2017-02-20 LAB — HEMOGLOBIN A1C
HEMOGLOBIN A1C: 5 % (ref 4.8–5.6)
Mean Plasma Glucose: 96.8 mg/dL

## 2017-02-20 LAB — TSH: TSH: 4.192 u[IU]/mL (ref 0.350–4.500)

## 2017-02-20 MED ORDER — ARIPIPRAZOLE 2 MG PO TABS
2.0000 mg | ORAL_TABLET | Freq: Every day | ORAL | Status: DC
  Administered 2017-02-20: 2 mg via ORAL
  Filled 2017-02-20 (×3): qty 1
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
## 2017-02-20 NOTE — Progress Notes (Signed)
Depressed affect, isolative to self. Denies all psychotic symptoms to this nurse today, endorses anxiety and depression. Requesting information about discharge process.

## 2017-02-20 NOTE — [facility] Group Notes (Signed)
[facility] LCSW Group Therapy  02/20/2017  1:05 PM  Type of Therapy:  Group therapy  Participation Level:  Active  Participation Quality:  Attentive  Affect:  Flat  Cognitive:  Oriented  Insight:  Limited  Engagement in Therapy:  Limited  Modes of Intervention:  Discussion, Socialization  Summary of Progress/Problems:  Chaplain was here to lead a group on themes of hope and courage. Stayed the entire time, engaged throughout. "I hope to feel better by being here, and to get back to my dad so I can help him out.  We just became close in the last year."   Katherine Burgess, Katherine Burgess 02/20/2017 1:33 PM
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
## 2017-02-20 NOTE — Tx Team (Signed)
Initial Treatment Plan 02/20/2017 12:33 AM Magaline Koren Shiver [place] ZOX:[id number]    PATIENT STRESSORS: Marital or family conflict   PATIENT STRENGTHS: Communication skills Physical Health Supportive family/friends   PATIENT IDENTIFIED PROBLEMS: Psychosis  "Trying to better my life"  "Have courage to be able to be on my own"                 DISCHARGE CRITERIA:  Ability to meet basic life and health needs Improved stabilization in mood, thinking, and/or behavior Motivation to continue treatment in a less acute level of care Need for constant or close observation no longer present Verbal commitment to aftercare and medication compliance  PRELIMINARY DISCHARGE PLAN: Outpatient therapy Return to previous living arrangement  PATIENT/FAMILY INVOLVEMENT: This treatment plan has been presented to and reviewed with the patient, Katherine Burgess.  The patient and family have been given the opportunity to ask questions and make suggestions.  Carleene OverlieMiddleton, [name] P, RN 02/20/2017, 12:33 AM

## 2017-02-20 NOTE — Plan of Care (Signed)
Problem: Safety: Goal: Periods of time without injury will increase Outcome: Progressing Pt safe on the unit at this time   

## 2017-02-20 NOTE — Tx Team (Signed)
Interdisciplinary Treatment and Diagnostic Plan Update  02/20/2017 Time of Session: 1:46 PM  Katherine Burgess MRN: [id number]  Principal Diagnosis: Severe recurrent major depression with psychotic features [facility])  Secondary Diagnoses: Principal Problem:   Severe recurrent major depression with psychotic features (HCC)   Current Medications:  Current Facility-Administered Medications  Medication Dose Route Frequency Provider Last Rate Last Dose  . acetaminophen (TYLENOL) tablet 650 mg  650 mg Oral Q6H PRN Nira Conn A, NP      . alum & mag hydroxide-simeth (MAALOX/MYLANTA) 200-200-20 MG/5ML suspension 30 mL  30 mL Oral Q4H PRN Nira Conn A, NP      . ARIPiprazole (ABILIFY) tablet 2 mg  2 mg Oral QHS Nelly Rout, MD      . citalopram (CELEXA) tablet 40 mg  40 mg Oral Daily Nira Conn A, NP   40 mg at 02/20/17 0817  . clonazePAM (KLONOPIN) tablet 0.5 mg  0.5 mg Oral TID PRN Nira Conn A, NP   0.5 mg at 02/20/17 3740  . hydrOXYzine (ATARAX/VISTARIL) tablet 25 mg  25 mg Oral TID PRN Nira Conn A, NP      . magnesium hydroxide (MILK OF MAGNESIA) suspension 30 mL  30 mL Oral Daily PRN Jackelyn Poling, NP        PTA Medications: Prescriptions Prior to Admission  Medication Sig Dispense Refill Last Dose  . citalopram (CELEXA) 40 MG tablet Take 40 mg by mouth daily.  3 02/18/2017  . clonazePAM (KLONOPIN) 0.5 MG tablet Take 0.5 mg by mouth 3 (three) times daily as needed for anxiety.  0 02/18/2017  . hydrOXYzine (VISTARIL) 25 MG capsule Take 25 mg by mouth 3 (three) times daily as needed for anxiety.  2 Past Month at Unknown time  . linaclotide (LINZESS) 145 MCG CAPS capsule Take 1 capsule (145 mcg total) by mouth daily before breakfast. (Patient taking differently: Take 145 mcg by mouth once a week. ) 30 capsule 1 Past Week at Unknown time  . mupirocin ointment (BACTROBAN) 2 % Apply to affected area 3 times daily (Patient not taking: Reported on 02/20/2017) 22 g 0 Not Taking at Unknown time   . [DISCONTINUED] fluconazole (DIFLUCAN) 150 MG tablet Take 1 tablet (150 mg total) by mouth daily. Take after completing the course of Doxycycline (Patient not taking: Reported on 02/18/2017) 1 tablet 1 Completed Course at Unknown time  . [DISCONTINUED] oxyCODONE-acetaminophen (PERCOCET) 5-325 MG tablet Take 1-2 tablets by mouth every 6 (six) hours as needed. (Patient not taking: Reported on 02/18/2017) 10 tablet 0 Completed Course at Unknown time  . [DISCONTINUED] oxyCODONE-acetaminophen (PERCOCET/ROXICET) 5-325 MG tablet Take 1 tablet by mouth every 4 (four) hours as needed. 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   Physician Treatment Plan for Primary Diagnosis: Severe recurrent major depression with psychotic features (HCC) Long Term Goal(s): Improvement in symptoms so as ready for discharge  Short Term Goals: Ability to verbalize feelings will improve Ability to disclose and discuss suicidal ideas Ability to identify and develop effective coping behaviors will improve Ability to identify changes in lifestyle to reduce recurrence of condition will improve Ability to demonstrate self-control will improve Ability to identify and develop effective coping behaviors will improve  Medication Management: Evaluate patient's  response, side effects, and tolerance of medication regimen.  Therapeutic Interventions: 1 to 1 sessions, Unit Group sessions and Medication administration.  Evaluation of Outcomes: Progressing  Physician Treatment Plan for Secondary Diagnosis: Principal Problem:   Severe recurrent major depression with psychotic features ([place])   Long Term  Goal(s): Improvement in symptoms so as ready for discharge  Short Term Goals: Ability to verbalize feelings will improve Ability to disclose and discuss suicidal ideas Ability to identify and develop effective coping behaviors will improve Ability to identify changes in lifestyle to reduce recurrence of condition will improve Ability to demonstrate self-control will improve Ability to identify and develop effective coping behaviors will improve  Medication Management: Evaluate patient's response, side effects, and tolerance of medication regimen.  Therapeutic Interventions: 1 to 1 sessions, Unit Group sessions and Medication administration.  Evaluation of Outcomes: Progressing   RN Treatment Plan for Primary Diagnosis: Severe recurrent major depression with psychotic features ([place]) Long Term Goal(s): Knowledge of disease and therapeutic regimen to maintain health will improve  Short Term Goals: Ability to identify and develop effective coping behaviors will improve and Compliance with prescribed medications will improve  Medication Management: RN will administer medications as ordered by provider, will assess and evaluate patient's response and provide education to patient for prescribed medication. RN will report any adverse and/or side effects to prescribing provider.  Therapeutic Interventions: 1 on 1 counseling sessions, Psychoeducation, Medication administration, Evaluate responses to treatment, Monitor vital signs and CBGs as ordered, Perform/monitor CIWA, COWS, AIMS and Fall Risk screenings as ordered, Perform wound care treatments as ordered.  Evaluation of Outcomes: Progressing   LCSW Treatment Plan for Primary Diagnosis: Severe recurrent major depression with psychotic features ([place]) Long Term Goal(s): Safe transition to appropriate next level of care at discharge, Engage patient in therapeutic group addressing interpersonal concerns.  Short Term Goals: Engage patient in  aftercare planning with referrals and resources  Therapeutic Interventions: Assess for all discharge needs, 1 to 1 time with Social worker, Explore available resources and support systems, Assess for adequacy in community support network, Educate family and significant other(s) on suicide prevention, Complete Psychosocial Assessment, Interpersonal group therapy.  Evaluation of Outcomes: Met  Return home, follow up [facility]   Progress in Treatment: Attending groups: Yes Participating in groups: Yes Taking medication as prescribed: Yes Toleration medication: Yes, no side effects reported at this time Family/Significant other contact made: No Patient understands diagnosis: Yes AEB asking for help with racing thoughts and anxiety Discussing patient identified problems/goals with staff: Yes Medical problems stabilized or resolved: Yes Denies suicidal/homicidal ideation: Yes Issues/concerns per patient self-inventory: None Other: N/A  New problem(s) identified: None identified at this time.   New Short Term/Long Term Goal(s): "I need help with racing thoughts, anxiety and loneliness"   Discharge Plan or Barriers:   Reason for Continuation of Hospitalization: Anxiety Paranoia  Depression Hallucinations  Medical Issues Medication stabilization   Estimated Length of Stay: 8/15  Attendees: Patient: Katherine Burgess 02/20/2017  1:46 PM  Physician: Ursula Alert, MD 02/20/2017  1:46 PM  Nursing: Sena Hitch, RN 02/20/2017  1:46 PM  RN Care Manager: Lars Pinks, RN 02/20/2017  1:46 PM  Social Worker: Ripley Fraise 02/20/2017  1:46 PM  Recreational Therapist: Winfield Cunas 02/20/2017  1:46 PM  Other: Norberto Sorenson 02/20/2017  1:46 PM  Other:  02/20/2017  1:46 PM    Scribe for Treatment Team:  Roque Lias LCSW 02/20/2017 1:46 PM
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
## 2017-02-20 NOTE — Progress Notes (Signed)
Admission Note:  36 year old female who presents voluntary, in no acute distress, for the treatment of Psychosis.  Per previous assessment, "Pt presents with complaint of passive suicidal ideation, paranoid ideation, auditory hallucination, psychotic behavior (thought-reading), and depressive symptoms". Patient denies SI/HI/AVH on admission. Patient appears flat, depressed, and blunted. Patient appears guarded and gave short answers with no elaboration during admission process.  Patient is cooperative on admission.  Patient identifies stressor as "marital issues, trying to get a divorce".  Patient denies drug or alcohol use.  Patient currently lives with her father and identifies her father as her support system.  [facility], patient would like to work on "Trying to better my life" and "Have courage to be able to be on my own". Skin was assessed and found to be clear of any abnormal marks.  Patient searched and no contraband found, POC and unit policies explained and understanding verbalized. Consents obtained.  Patient had no additional questions or concerns.

## 2017-02-20 NOTE — Progress Notes (Signed)
Recreation Therapy Notes  Date: 02/20/2017 Time: 10:00am Location: 500 Hall Dayroom  Group Topic: Communication and Problem-Solving  Goal Area(s) Addresses:  Pts will be able to successfully work with their team members to reach a common goal with the materials they are provided.  Behavioral Response: Engaged  Intervention: Uncooked Spaghetti, masking tape and marshmallows  Activity: Pts were asked to work in a group to create a tower with spaghetti and masking tape that will hold a marshmallow on top.   Education: Communication, Problem-Solving, Discharge Planning  Education Outcome: Acknowledges understanding  Clinical Observations/Feedback: Pt actively participated in activity. Pt identified that the skills he needed to use in order to be successful within a team as working together, working without arguing with one another and being creative. Pt reported that in order to be successful with individuals post d/c is by talking to people, helping others and being compassionate.  Marvell Fullerachel Meyer, Recreation Therapy Intern   Caroll RancherMarjette Damiel Barthold, LRT/CTRS

## 2017-02-20 NOTE — Progress Notes (Signed)
D: Pt denies SI/HI/AVH. Pt is pleasant and cooperative. Pt state she was doing better due to her medications  A: Pt was offered support and encouragement. Pt was given scheduled medications. Pt was encourage to attend groups. Q 15 minute checks were done for safety.   R:Pt attends groups and interacts well with peers and staff. Pt is taking medication. Pt has no complaints.Pt receptive to treatment and safety maintained on unit.
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
## 2017-02-21 ENCOUNTER — Other Ambulatory Visit: Payer: Self-pay

## 2017-02-21 DIAGNOSIS — F1490 Cocaine use, unspecified, uncomplicated: Secondary | ICD-10-CM

## 2017-02-21 DIAGNOSIS — F1290 Cannabis use, unspecified, uncomplicated: Secondary | ICD-10-CM

## 2017-02-21 DIAGNOSIS — F419 Anxiety disorder, unspecified: Secondary | ICD-10-CM

## 2017-02-21 LAB — PROLACTIN: Prolactin: 34.8 ng/mL — ABNORMAL HIGH (ref 4.8–23.3)

## 2017-02-21 LAB — SYPHILIS: RPR W/REFLEX TO RPR TITER AND TREPONEMAL ANTIBODIES, TRADITIONAL SCREENING AND DIAGNOSIS ALGORITHM: RPR Ser Ql: NONREACTIVE

## 2017-02-21 MED ORDER — ARIPIPRAZOLE 5 MG PO TABS
5.0000 mg | ORAL_TABLET | Freq: Every day | ORAL | Status: DC
  Administered 2017-02-21 – 2017-02-22 (×2): 5 mg via ORAL
  Filled 2017-02-21 (×3): qty 1

## 2017-02-21 NOTE — Progress Notes (Signed)
[facility] Group Notes:  (Nursing/MHT/Case Management/Adjunct)  Date:  02/21/2017  Time:  9:08 PM  Type of Therapy:  Psychoeducational Skills  Participation Level:  Active  Participation Quality:  Appropriate  Affect:  Appropriate  Cognitive:  Appropriate  Insight:  Good  Engagement in Group:  Engaged  Modes of Intervention:  Education  Summary of Progress/Problems: The patient shared in group that she had a good day overall since she is beginning to feel better. In addition, she stated that she is "coping better" and is "relaxing more". In terms of the theme for the day, her coping skill will be to take a long bath and to spend more time with her friends. The patient is also verbalizing that her "racing thoughts" are decreasing.   Katherine Burgess, Katherine Burgess 02/21/2017, 9:08 PM

## 2017-02-21 NOTE — [facility] Group Notes (Addendum)
  [facility] LCSW Group Therapy Note  Date/Time:  02/21/2017 11:15AM-12:00PM  Type of Therapy and Topic:  Group Therapy:  Feelings About Hospitalization  Participation Level:  Active   Description of Group This process group involved patients discussing their feelings related to being hospitalized, as well as the benefits they see to being in the hospital.  These feelings and benefits were itemized.  The group then brainstormed specific ways in which they could seek those same benefits when they discharge and return home.  These were listed on the whiteboard and when patients thought they would like to remember them and pursue them at discharge, they were encouraged to copy them down.  Therapeutic Goals 1. Patient will identify and describe positive and negative feelings related to hospitalization 2. Patient will verbalize benefits of hospitalization to themselves personally 3. Patients will brainstorm together ways they can obtain similar benefits in the outpatient setting, identify barriers to wellness and possible solutions  Summary of Patient Progress:  The patient expressed her primary feelings about being hospitalized are that it has helped her but she feels ready to go home.  At the end of group she stated she thought she needs to do all of the things listed on the whiteboard.  Therapeutic Modalities Cognitive Behavioral Therapy Motivational Interviewing    Ambrose MantleMareida Grossman-Orr, LCSW 02/21/2017, 1:43 PM

## 2017-02-21 NOTE — Progress Notes (Addendum)
D Flecia is seen [facility] on the 500 hall today...she is quiet, shy and does not initiate conversation with this Clinical research associatewriter and /or other patients and staff. She is flat, avoids eye contact, is isolative and when she speaks, she is barely able to be heard her voice is so low and hardly audible. A She completes her daily assessment and on this she writes she rates her depression, hopelessness  And anxiety " 0/0/2", respectively. Urine is sent to check for gonorrhea and chlymidia and EKG is completed and shown to Dr. Elna BreslowEappen.  Cc placed in patient's shadow chart. R Pt endorses paranoia, guarded behaviors and  Frequently presents to this nurse, asks this writer " are you sure I can't go home today" and then returns to watching the TV or laying in her bed.  Pt states " I hope I'm getting close to going home I'm ready". Pt requests prn klonopin for " anxiety" and is gvien this per MD order. Safety is in place.
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
## 2017-02-21 NOTE — Progress Notes (Signed)
Nursing Progress Note: 7p-7a D: Pt currently presents with a limited/anxious/flat affect and behavior. Pt states "I had a better day. I am just ready to go." Interacting appropriately with milieu. Pt reports good sleep during the previous night with current medication regimen.   A: Pt provided with medications per providers orders. Pt's labs and vitals were monitored throughout the night. Pt supported emotionally and encouraged to express concerns and questions. Pt educated on medications.  R: Pt's safety ensured with 15 minute and environmental checks. Pt currently denies SI, HI, and AVH. Pt verbally contracts to seek staff if SI,HI, or AVH occurs and to consult with staff before acting on any harmful thoughts. Will continue to monitor.

## 2017-02-22 MED ORDER — CEPHALEXIN 500 MG PO CAPS
500.0000 mg | ORAL_CAPSULE | Freq: Two times a day (BID) | ORAL | Status: DC
  Administered 2017-02-22 – 2017-02-23 (×2): 500 mg via ORAL
  Filled 2017-02-22: qty 1
  Filled 2017-02-22: qty 2
  Filled 2017-02-22 (×3): qty 1

## 2017-02-22 MED ORDER — MUPIROCIN CALCIUM 2 % EX CREA
TOPICAL_CREAM | Freq: Two times a day (BID) | CUTANEOUS | Status: DC
  Administered 2017-02-22 – 2017-02-23 (×2): via TOPICAL
  Filled 2017-02-22 (×2): qty 15

## 2017-02-22 NOTE — Progress Notes (Signed)
[facility] Group Notes:  (Nursing/MHT/Case Management/Adjunct)  Date:  02/22/2017  Time:  9:05 PM  Type of Therapy:  Psychoeducational Skills  Participation Level:  Active  Participation Quality:  Attentive  Affect:  Appropriate  Cognitive:  Appropriate  Insight:  Improving  Engagement in Group:  Developing/Improving  Modes of Intervention:  Education  Summary of Progress/Problems: Patient states that she had a good day and that she believes that she will be discharged tomorrow. She feels that the medications have been more effective and that taking them on time has helped as well. In terms of the theme for the day, her support system will be comprised of her mother and friend.   [name], Osmel Dykstra S 02/22/2017, 9:05 PM

## 2017-02-22 NOTE — Progress Notes (Signed)
D Kallan is quiet, isolative and conmt with a flat, depresssed demeanor. She is seen in the dayroom and sits in there most of the day while she watches TV. A She completed her daily assessment and on this she wrote she denied SI and she rated her depression, hopelessness and anxiety " 0/0/0", respectively. At 1730 today, she asked this nurse to " get me some medicine for the boil on my butt". Writer called NP Feliz Beam( Travis) and NP (TL) able to assesspt at 1830 and stated she would order po keflex and bactroban tonight. Pt advised these would be given as soon as NP entered orders and pharmacy okay'd orders.  Area quarter-sized and bright red and situated on mid left center buttocks. Area is closed and skin is intact. R Safety is in place.

## 2017-02-22 NOTE — [facility] Group Notes (Signed)
[facility] Therapy Note  Date/Time:  02/22/2017  11:00AM-12:00PM  Type of Therapy and Topic:  Group Therapy:  Music and Mood  Participation Level:  Active   Description of Group: In this process group, members listened to a variety of genres of music and identified that different types of music evoke different responses.  Patients were encouraged to identify music that was soothing for them and music that was energizing for them.  Patients discussed how this knowledge can help with wellness and recovery in various ways including managing depression and anxiety as well as encouraging healthy sleep habits.    Therapeutic Goals: 1. Patients will explore the impact of different varieties of music on mood 2. Patients will verbalize the thoughts they have when listening to different types of music 3. Patients will identify music that is soothing to them as well as music that is energizing to them 4. Patients will discuss how to use this knowledge to assist in maintaining wellness and recovery 5. Patients will explore the use of music as a coping skill  Summary of Patient Progress:  At the beginning of group, patient expressed that she was in a good mood because she anticipates going home tomorrow.  At the end of group she stated she felt tranquil.  She wanted to clarify some elements of her discharge plan, and CSW agreed to look into what has been signed, etc, to facilitate her discharge tomorrow.  Therapeutic Modalities: Solution Focused Brief Therapy Motivational Interviewing Activity   Ambrose MantleMareida Grossman-Orr, LCSW 02/22/2017 1:17 PM

## 2017-02-22 NOTE — Progress Notes (Signed)
D.  Pt pleasant but guarded on approach.  Complaint of sore on right lower buttock.  Pt did attend evening wrap up group and is hopeful for discharge tomorrow.  Pt denies SI/HI/AVH at this time.  A.  Medication given as ordered, support and encouragement offered  R. Pt remains safe on the unit,will continue to monitor.
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
## 2017-02-22 NOTE — [facility] Suicide Risk Assessment (Signed)
[facility] INPATIENT:  Family/Significant Other Suicide Prevention Education  Suicide Prevention Education:  Education Completed; father Katherine Burgess [phone] has been identified by the patient as the family member/significant other with whom the patient will be residing, and identified as the person(s) who will aid the patient in the event of a mental health crisis (suicidal ideations/suicide attempt).  With written consent from the patient, the family member/significant other has been provided the following suicide prevention education, prior to the and/or following the discharge of the patient.  FATHER STATED THERE ARE NO FIREARMS IN THE HOME.  HE HAS SPOKEN WITH PATIENT, FEELS SHE IS DOING WELL.  HE DOES NOT KNOW IF SHE WILL COME LIVE WITH HIM OR WITH MOTHER.  The suicide prevention education provided includes the following:  Suicide risk factors  Suicide prevention and interventions  National Suicide Hotline telephone number  [facility] assessment telephone number  [facility] Emergency Assistance 911  [facility] and/or Residential Mobile Crisis Unit telephone number  Request made of family/significant other to:  Remove weapons (e.g., guns, rifles, knives), all items previously/currently identified as safety concern.    Remove drugs/medications (over-the-counter, prescriptions, illicit drugs), all items previously/currently identified as a safety concern.  The family member/significant other verbalizes understanding of the suicide prevention education information provided.  The family member/significant other agrees to remove the items of safety concern listed above.  Katherine JaegerMareida J Burgess 02/22/2017, 1:35 PM

## 2017-02-23 DIAGNOSIS — F1411 Cocaine abuse, in remission: Secondary | ICD-10-CM

## 2017-02-23 DIAGNOSIS — G4700 Insomnia, unspecified: Secondary | ICD-10-CM

## 2017-02-23 DIAGNOSIS — F22 Delusional disorders: Secondary | ICD-10-CM

## 2017-02-23 DIAGNOSIS — F1211 Cannabis abuse, in remission: Secondary | ICD-10-CM

## 2017-02-23 DIAGNOSIS — R634 Abnormal weight loss: Secondary | ICD-10-CM

## 2017-02-23 LAB — GC/CHLAMYDIA PROBE AMP (~~LOC~~) NOT AT ARMC
CHLAMYDIA, DNA PROBE: NEGATIVE
NEISSERIA GONORRHEA: NEGATIVE

## 2017-02-23 MED ORDER — CEPHALEXIN 500 MG PO CAPS: 500.0000 mg | ORAL_CAPSULE | Freq: Two times a day (BID) | ORAL | 0 refills | Status: DC

## 2017-02-23 MED ORDER — ARIPIPRAZOLE 5 MG PO TABS: 5.0000 mg | ORAL_TABLET | Freq: Every day | ORAL | 0 refills | Status: DC

## 2017-02-23 MED ORDER — HYDROXYZINE HCL 25 MG PO TABS: 25.0000 mg | ORAL_TABLET | Freq: Three times a day (TID) | ORAL | 0 refills | Status: DC | PRN

## 2017-02-23 MED ORDER — CITALOPRAM HYDROBROMIDE 40 MG PO TABS: 40.0000 mg | ORAL_TABLET | Freq: Every day | ORAL | 0 refills | Status: DC

## 2017-02-23 MED ORDER — CLONAZEPAM 0.5 MG PO TABS: 0.5000 mg | ORAL_TABLET | Freq: Three times a day (TID) | ORAL | 0 refills | Status: DC | PRN
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
## 2017-02-23 NOTE — [facility] Group Notes (Cosign Needed)
Nursing Group Note 1030  The topic for group today was Wellness.  We asked the patients to introduce themselves and share the goals they put on their self-inventory sheets with the group.  We then went through the Monday "Wellness" packet and encouraged the patients to complete their packets in their own time.  Patient attended entire group, participated, and was receptive. 

## 2017-02-23 NOTE — Progress Notes (Signed)
Nursing Discharge Note 02/23/2017 [id number]  Data Reports sleeping good with PRN sleep med.  Rates depression 0/10, hopelessness 0/10, and anxiety 0/10. Affect anxious mood euthymic.  Denies HI, SI, AVH.  Attending groups, taking medicines as ordered.  Received discharge orders.  Action Spoke with patient 1:1, nurse offered support to patient throughout shift.  Reviewed medications, discharge instructions, and follow up appointments with patient. Medication scripts reviewed and given to patient.  Paperwork, AVS, SRA, and transition record handed to patient.   Escorted off of unit at 1330. Belongings returned per belongings form.  Discharged to lobby where she was met by [facility]- to take patient to her car which patient states is parked at [place].    Response Verbalized understanding of discharge teaching. Agrees to contact someone or 911 with thoughts/intent to harm self or others.    To follow up per AVS.

## 2017-02-23 NOTE — Tx Team (Signed)
Interdisciplinary Treatment and Diagnostic Plan Update  02/23/2017 Time of Session: 8:27 AM  Katherine Burgess MRN: [id number]  Principal Diagnosis: Severe recurrent major depression with psychotic features [facility])  Secondary Diagnoses: Principal Problem:   Severe recurrent major depression with psychotic features ([place])   Current Medications:  Current Facility-Administered Medications  Medication Dose Route Frequency Provider Last Rate Last Dose  . acetaminophen (TYLENOL) tablet 650 mg  650 mg Oral Q6H PRN Lindon Romp A, NP      . alum & mag hydroxide-simeth (MAALOX/MYLANTA) 200-200-20 MG/5ML suspension 30 mL  30 mL Oral Q4H PRN Lindon Romp A, NP   30 mL at 02/21/17 2103  . ARIPiprazole (ABILIFY) tablet 5 mg  5 mg Oral QHS Eappen, Saramma, MD   5 mg at 02/22/17 2057  . cephALEXin (KEFLEX) capsule 500 mg  500 mg Oral BID Derrill Center, NP   500 mg at 02/23/17 0758  . citalopram (CELEXA) tablet 40 mg  40 mg Oral Daily Lindon Romp A, NP   40 mg at 02/23/17 0758  . clonazePAM (KLONOPIN) tablet 0.5 mg  0.5 mg Oral TID PRN Lindon Romp A, NP   0.5 mg at 02/23/17 0800  . hydrOXYzine (ATARAX/VISTARIL) tablet 25 mg  25 mg Oral TID PRN Lindon Romp A, NP   25 mg at 02/22/17 2059  . magnesium hydroxide (MILK OF MAGNESIA) suspension 30 mL  30 mL Oral Daily PRN Lindon Romp A, NP      . mupirocin cream (BACTROBAN) 2 %   Topical BID Derrill Center, NP        PTA Medications: Prescriptions Prior to Admission  Medication Sig Dispense Refill Last Dose  . citalopram (CELEXA) 40 MG tablet Take 40 mg by mouth daily.  3 02/18/2017  . clonazePAM (KLONOPIN) 0.5 MG tablet Take 0.5 mg by mouth 3 (three) times daily as needed for anxiety.  0 02/18/2017  . hydrOXYzine (VISTARIL) 25 MG capsule Take 25 mg by mouth 3 (three) times daily as needed for anxiety.  2 Past Month at Unknown time  . linaclotide (LINZESS) 145 MCG CAPS capsule Take 1 capsule (145 mcg total) by mouth daily before breakfast. (Patient taking  differently: Take 145 mcg by mouth once a week. ) 30 capsule 1 Past Week at Unknown time  . mupirocin ointment (BACTROBAN) 2 % Apply to affected area 3 times daily (Patient not taking: Reported on 02/20/2017) 22 g 0 Not Taking at Unknown time  . [DISCONTINUED] fluconazole (DIFLUCAN) 150 MG tablet Take 1 tablet (150 mg total) by mouth daily. Take after completing the course of Doxycycline (Patient not taking: Reported on 02/18/2017) 1 tablet 1 Completed Course at Unknown time  . [DISCONTINUED] oxyCODONE-acetaminophen (PERCOCET) 5-325 MG tablet Take 1-2 tablets by mouth every 6 (six) hours as needed. (Patient not taking: Reported on 02/18/2017) 10 tablet 0 Completed Course at Unknown time  . [DISCONTINUED] oxyCODONE-acetaminophen (PERCOCET/ROXICET) 5-325 MG tablet Take 1 tablet by mouth every 4 (four) hours as needed. 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   Physician Treatment Plan for Primary Diagnosis: Severe recurrent major depression with psychotic features ([place]) Long Term Goal(s): Improvement in symptoms so as ready for discharge  Short Term Goals: Ability to verbalize feelings will improve  Ability to disclose and discuss suicidal ideas Ability to identify and develop effective coping behaviors will improve Ability to identify changes in lifestyle to reduce recurrence of condition will improve Ability to demonstrate self-control will improve Ability to identify and develop effective coping behaviors will improve  Medication Management: Evaluate patient's response, side effects, and tolerance of medication regimen.  Therapeutic Interventions: 1 to 1 sessions, Unit  Group sessions and Medication administration.  Evaluation of Outcomes: Adequate for Discharge  Physician Treatment Plan for Secondary Diagnosis: Principal Problem:   Severe recurrent major depression with psychotic features ([place])   Long Term Goal(s): Improvement in symptoms so as ready for discharge  Short Term Goals: Ability to verbalize feelings will improve Ability to disclose and discuss suicidal ideas Ability to identify and develop effective coping behaviors will improve Ability to identify changes in lifestyle to reduce recurrence of condition will improve Ability to demonstrate self-control will improve Ability to identify and develop effective coping behaviors will improve  Medication Management: Evaluate patient's response, side effects, and tolerance of medication regimen.  Therapeutic Interventions: 1 to 1 sessions, Unit Group sessions and Medication administration.  Evaluation of Outcomes: Adequate for Discharge   RN Treatment Plan for Primary Diagnosis: Severe recurrent major depression with psychotic features ([place]) Long Term Goal(s): Knowledge of disease and therapeutic regimen to maintain health will improve  Short Term Goals: Ability to identify and develop effective coping behaviors will improve and Compliance with prescribed medications will improve  Medication Management: RN will administer medications as ordered by provider, will assess and evaluate patient's response and provide education to patient for prescribed medication. RN will report any adverse and/or side effects to prescribing provider.  Therapeutic Interventions: 1 on 1 counseling sessions, Psychoeducation, Medication administration, Evaluate responses to treatment, Monitor vital signs and CBGs as ordered, Perform/monitor CIWA, COWS, AIMS and Fall Risk screenings as ordered, Perform wound care treatments as ordered.  Evaluation of Outcomes: Adequate for Discharge   LCSW Treatment Plan for Primary  Diagnosis: Severe recurrent major depression with psychotic features ([place]) Long Term Goal(s): Safe transition to appropriate next level of care at discharge, Engage patient in therapeutic group addressing interpersonal concerns.  Short Term Goals: Engage patient in aftercare planning with referrals and resources  Therapeutic Interventions: Assess for all discharge needs, 1 to 1 time with Social worker, Explore available resources and support systems, Assess for adequacy in community support network, Educate family and significant other(s) on suicide prevention, Complete Psychosocial Assessment, Interpersonal group therapy.  Evaluation of Outcomes: Met  Return home, follow up [facility]   Progress in Treatment: Attending groups: Yes Participating in groups: Yes Taking medication as prescribed: Yes Toleration medication: Yes, no side effects reported at this time Family/Significant other contact made: No Patient understands diagnosis: Yes AEB asking for help with racing thoughts and anxiety Discussing patient identified problems/goals with staff: Yes Medical problems stabilized or resolved: Yes Denies suicidal/homicidal ideation: Yes Issues/concerns per patient self-inventory: None Other: N/A  New problem(s) identified: None identified at this time.   New Short Term/Long Term Goal(s): "I need help with racing thoughts, anxiety and loneliness"   Discharge Plan or Barriers:   Reason for Continuation of Hospitalization:   Estimated Length of Stay: D/C today  Attendees: Patient:  02/23/2017  8:27 AM  Physician: Ursula Alert, MD 02/23/2017  8:27 AM  Nursing: Sena Hitch, RN 02/23/2017  8:27 AM  RN Care Manager: Lars Pinks, RN 02/23/2017  8:27 AM  Social Worker: Ripley Fraise 02/23/2017  8:27 AM  Recreational Therapist: Winfield Cunas 02/23/2017  8:27 AM  Other: Norberto Sorenson 02/23/2017  8:27 AM  Other:  02/23/2017  8:27 AM    Scribe for Treatment Team:  Roque Lias  LCSW 02/23/2017 8:27 AM

## 2017-02-23 NOTE — Progress Notes (Signed)
  [facility] :  Will you be returning to the same living situation after discharge:  Yes,  home with father At discharge, do you have transportation home?: Yes,  security to [facility] to get car Do you have the ability to pay for your medications: Yes,  MCD  Release of information consent forms completed and in the chart;  Patient's signature needed at discharge.  Patient to Follow up at: Follow-up Information    [facility] Follow up on 03/03/2017.   Why:  Tuesday of next week at 2:00 with the Dr. Then Thursday the 30th at noon with your therapist Contact information: [address] [place] [address] [phone]           Next level of care provider has [facility] Link:no  Safety Planning and Suicide Prevention discussed: Yes,  yes  Have you used any form of tobacco in the last 30 days? (Cigarettes, Smokeless Tobacco, Cigars, and/or Pipes): Yes  Has patient been referred to the Quitline?: Patient refused referral  Patient has been referred for addiction treatment: N/A  Ida RogueRodney B Rosmery Duggin, LCSW 02/23/2017, 8:25 AM

## 2017-11-20 ENCOUNTER — Emergency Department (HOSPITAL_COMMUNITY)
Admission: EM | Admit: 2017-11-20 | Discharge: 2017-11-20 | Disposition: A | Discharge status: Home / Self Care | Payer: No Typology Code available for payment source | Attending: Emergency Medicine | Admitting: Emergency Medicine

## 2017-11-20 ENCOUNTER — Encounter (HOSPITAL_COMMUNITY): Payer: Self-pay | Admitting: *Deleted

## 2017-11-20 ENCOUNTER — Other Ambulatory Visit: Payer: Self-pay

## 2017-11-20 ENCOUNTER — Emergency Department (HOSPITAL_COMMUNITY): Payer: No Typology Code available for payment source

## 2017-11-20 DIAGNOSIS — M542 Cervicalgia: Secondary | ICD-10-CM | POA: Diagnosis not present

## 2017-11-20 DIAGNOSIS — F319 Bipolar disorder, unspecified: Secondary | ICD-10-CM | POA: Diagnosis not present

## 2017-11-20 DIAGNOSIS — J45909 Unspecified asthma, uncomplicated: Secondary | ICD-10-CM | POA: Insufficient documentation

## 2017-11-20 DIAGNOSIS — F17210 Nicotine dependence, cigarettes, uncomplicated: Secondary | ICD-10-CM | POA: Diagnosis not present

## 2017-11-20 DIAGNOSIS — Z91010 Allergy to peanuts: Secondary | ICD-10-CM | POA: Insufficient documentation

## 2017-11-20 DIAGNOSIS — F419 Anxiety disorder, unspecified: Secondary | ICD-10-CM | POA: Diagnosis not present

## 2017-11-20 DIAGNOSIS — M7918 Myalgia, other site: Secondary | ICD-10-CM

## 2017-11-20 DIAGNOSIS — R52 Pain, unspecified: Secondary | ICD-10-CM

## 2017-11-20 DIAGNOSIS — R51 Headache: Secondary | ICD-10-CM | POA: Diagnosis not present

## 2017-11-20 DIAGNOSIS — M549 Dorsalgia, unspecified: Secondary | ICD-10-CM | POA: Diagnosis not present

## 2017-11-20 DIAGNOSIS — R079 Chest pain, unspecified: Secondary | ICD-10-CM | POA: Diagnosis present

## 2017-11-20 LAB — URINALYSIS, ROUTINE W REFLEX MICROSCOPIC
BILIRUBIN URINE: NEGATIVE
Glucose, UA: NEGATIVE mg/dL
KETONES UR: NEGATIVE mg/dL
LEUKOCYTES UA: NEGATIVE
NITRITE: NEGATIVE
PH: 5 (ref 5.0–8.0)
Protein, ur: NEGATIVE mg/dL
Specific Gravity, Urine: 1.021 (ref 1.005–1.030)

## 2017-11-20 LAB — POC URINE PREG, ED: PREG TEST UR: NEGATIVE

## 2017-11-20 MED ORDER — METHOCARBAMOL 500 MG PO TABS
500.0000 mg | ORAL_TABLET | Freq: Once | ORAL | Status: AC
  Administered 2017-11-20: 500 mg via ORAL
  Filled 2017-11-20: qty 1

## 2017-11-20 MED ORDER — IBUPROFEN 600 MG PO TABS: 600.0000 mg | ORAL_TABLET | Freq: Four times a day (QID) | ORAL | 0 refills | Status: DC | PRN

## 2017-11-20 MED ORDER — KETOROLAC TROMETHAMINE 30 MG/ML IJ SOLN
30.0000 mg | Freq: Once | INTRAMUSCULAR | Status: AC
  Administered 2017-11-20: 30 mg via INTRAMUSCULAR
  Filled 2017-11-20: qty 1

## 2017-11-20 MED ORDER — METHOCARBAMOL 500 MG PO TABS: 500.0000 mg | ORAL_TABLET | Freq: Four times a day (QID) | ORAL | 0 refills | Status: AC

## 2017-11-20 MED ORDER — HYDROCODONE-ACETAMINOPHEN 5-325 MG PO TABS: 1.0000 | ORAL_TABLET | ORAL | 0 refills | Status: DC | PRN

## 2017-11-20 MED ORDER — HYDROCODONE-ACETAMINOPHEN 5-325 MG PO TABS
1.0000 | ORAL_TABLET | Freq: Once | ORAL | Status: AC
  Administered 2017-11-20: 1 via ORAL
  Filled 2017-11-20: qty 1
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
## 2017-11-24 ENCOUNTER — Encounter (HOSPITAL_COMMUNITY): Payer: Self-pay

## 2017-11-24 ENCOUNTER — Emergency Department (HOSPITAL_COMMUNITY): Payer: No Typology Code available for payment source

## 2017-11-24 ENCOUNTER — Other Ambulatory Visit: Payer: Self-pay

## 2017-11-24 ENCOUNTER — Emergency Department (HOSPITAL_COMMUNITY)
Admission: EM | Admit: 2017-11-24 | Discharge: 2017-11-24 | Disposition: A | Discharge status: Home / Self Care | Payer: No Typology Code available for payment source | Attending: Emergency Medicine | Admitting: Emergency Medicine

## 2017-11-24 DIAGNOSIS — X58XXXA Exposure to other specified factors, initial encounter: Secondary | ICD-10-CM | POA: Diagnosis not present

## 2017-11-24 DIAGNOSIS — Z79899 Other long term (current) drug therapy: Secondary | ICD-10-CM | POA: Insufficient documentation

## 2017-11-24 DIAGNOSIS — Y929 Unspecified place or not applicable: Secondary | ICD-10-CM | POA: Insufficient documentation

## 2017-11-24 DIAGNOSIS — Y939 Activity, unspecified: Secondary | ICD-10-CM | POA: Diagnosis not present

## 2017-11-24 DIAGNOSIS — Z91010 Allergy to peanuts: Secondary | ICD-10-CM | POA: Insufficient documentation

## 2017-11-24 DIAGNOSIS — Y999 Unspecified external cause status: Secondary | ICD-10-CM | POA: Insufficient documentation

## 2017-11-24 DIAGNOSIS — T148XXA Other injury of unspecified body region, initial encounter: Secondary | ICD-10-CM | POA: Diagnosis not present

## 2017-11-24 DIAGNOSIS — J45909 Unspecified asthma, uncomplicated: Secondary | ICD-10-CM | POA: Diagnosis not present

## 2017-11-24 DIAGNOSIS — R079 Chest pain, unspecified: Secondary | ICD-10-CM | POA: Diagnosis present

## 2017-11-24 DIAGNOSIS — F17210 Nicotine dependence, cigarettes, uncomplicated: Secondary | ICD-10-CM | POA: Insufficient documentation

## 2017-11-24 DIAGNOSIS — S161XXA Strain of muscle, fascia and tendon at neck level, initial encounter: Secondary | ICD-10-CM | POA: Diagnosis not present

## 2017-11-24 LAB — POC URINE PREG, ED: Preg Test, Ur: NEGATIVE

## 2017-11-24 MED ORDER — KETOROLAC TROMETHAMINE 30 MG/ML IJ SOLN
30.0000 mg | Freq: Once | INTRAMUSCULAR | Status: AC
  Administered 2017-11-24: 30 mg via INTRAMUSCULAR
  Filled 2017-11-24: qty 1

## 2017-11-24 MED ORDER — ETODOLAC 500 MG PO TABS: 500.0000 mg | ORAL_TABLET | Freq: Two times a day (BID) | ORAL | 0 refills | Status: DC
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
## 2017-11-24 NOTE — Discharge Instructions (Addendum)
Consider physical therapy or chiropractic care if your symptoms persist, follow up with your primary care doctor if not significantly improved over the next week

## 2018-02-17 ENCOUNTER — Other Ambulatory Visit (HOSPITAL_COMMUNITY): Payer: Self-pay | Admitting: Internal Medicine

## 2018-02-17 DIAGNOSIS — M7060 Trochanteric bursitis, unspecified hip: Secondary | ICD-10-CM

## 2018-02-17 DIAGNOSIS — M1612 Unilateral primary osteoarthritis, left hip: Secondary | ICD-10-CM

## 2018-02-17 DIAGNOSIS — M259 Joint disorder, unspecified: Secondary | ICD-10-CM

## 2018-02-26 ENCOUNTER — Ambulatory Visit (HOSPITAL_COMMUNITY): Payer: Medicaid Other

## 2018-02-26 ENCOUNTER — Encounter (HOSPITAL_COMMUNITY): Payer: Self-pay

## 2018-03-09 ENCOUNTER — Other Ambulatory Visit: Payer: Self-pay | Admitting: Adult Health
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
## 2018-04-12 ENCOUNTER — Ambulatory Visit (HOSPITAL_COMMUNITY): Payer: Medicaid Other | Admitting: Psychiatry

## 2018-04-13 ENCOUNTER — Other Ambulatory Visit: Payer: Self-pay | Admitting: Adult Health

## 2018-12-06 ENCOUNTER — Emergency Department
Admission: EM | Admit: 2018-12-06 | Discharge: 2018-12-06 | Disposition: A | Discharge status: Home / Self Care | Payer: Medicaid Other | Attending: Emergency Medicine | Admitting: Emergency Medicine

## 2018-12-06 ENCOUNTER — Other Ambulatory Visit: Payer: Self-pay

## 2018-12-06 ENCOUNTER — Encounter: Payer: Self-pay | Admitting: Emergency Medicine

## 2018-12-06 DIAGNOSIS — G43909 Migraine, unspecified, not intractable, without status migrainosus: Secondary | ICD-10-CM | POA: Insufficient documentation

## 2018-12-06 DIAGNOSIS — R1030 Lower abdominal pain, unspecified: Secondary | ICD-10-CM | POA: Diagnosis present

## 2018-12-06 DIAGNOSIS — B9689 Other specified bacterial agents as the cause of diseases classified elsewhere: Secondary | ICD-10-CM | POA: Insufficient documentation

## 2018-12-06 DIAGNOSIS — F17210 Nicotine dependence, cigarettes, uncomplicated: Secondary | ICD-10-CM | POA: Diagnosis not present

## 2018-12-06 DIAGNOSIS — N760 Acute vaginitis: Secondary | ICD-10-CM | POA: Insufficient documentation

## 2018-12-06 DIAGNOSIS — Z79899 Other long term (current) drug therapy: Secondary | ICD-10-CM | POA: Diagnosis not present

## 2018-12-06 DIAGNOSIS — G43001 Migraine without aura, not intractable, with status migrainosus: Secondary | ICD-10-CM

## 2018-12-06 LAB — CBC WITH DIFFERENTIAL/PLATELET
Abs Immature Granulocytes: 0.03 10*3/uL (ref 0.00–0.07)
Basophils Absolute: 0 10*3/uL (ref 0.0–0.1)
Basophils Relative: 0 %
Eosinophils Absolute: 0 10*3/uL (ref 0.0–0.5)
Eosinophils Relative: 1 %
HCT: 42.1 % (ref 36.0–46.0)
Hemoglobin: 14.2 g/dL (ref 12.0–15.0)
Immature Granulocytes: 1 %
Lymphocytes Relative: 28 %
Lymphs Abs: 1.8 10*3/uL (ref 0.7–4.0)
MCH: 32.1 pg (ref 26.0–34.0)
MCHC: 33.7 g/dL (ref 30.0–36.0)
MCV: 95 fL (ref 80.0–100.0)
Monocytes Absolute: 0.3 10*3/uL (ref 0.1–1.0)
Monocytes Relative: 5 %
Neutro Abs: 4.4 10*3/uL (ref 1.7–7.7)
Neutrophils Relative %: 65 %
Platelets: 341 10*3/uL (ref 150–400)
RBC: 4.43 MIL/uL (ref 3.87–5.11)
RDW: 12.8 % (ref 11.5–15.5)
WBC: 6.6 10*3/uL (ref 4.0–10.5)
nRBC: 0 % (ref 0.0–0.2)

## 2018-12-06 LAB — COMPREHENSIVE METABOLIC PANEL
ALT: 22 U/L (ref 0–44)
AST: 20 U/L (ref 15–41)
Albumin: 4.3 g/dL (ref 3.5–5.0)
Alkaline Phosphatase: 66 U/L (ref 38–126)
Anion gap: 9 (ref 5–15)
BUN: 10 mg/dL (ref 6–20)
CO2: 21 mmol/L — ABNORMAL LOW (ref 22–32)
Calcium: 9.1 mg/dL (ref 8.9–10.3)
Chloride: 108 mmol/L (ref 98–111)
Creatinine, Ser: 0.68 mg/dL (ref 0.44–1.00)
GFR calc Af Amer: >60 mL/min (ref >60)
GFR calc non Af Amer: >60 mL/min (ref >60)
Glucose, Bld: 102 mg/dL — ABNORMAL HIGH (ref 70–99)
Potassium: 3.9 mmol/L (ref 3.5–5.1)
Sodium: 138 mmol/L (ref 135–145)
Total Bilirubin: 0.5 mg/dL (ref 0.3–1.2)
Total Protein: 7.9 g/dL (ref 6.5–8.1)

## 2018-12-06 LAB — URINALYSIS, COMPLETE (UACMP) WITH MICROSCOPIC
Bilirubin Urine: NEGATIVE
Glucose, UA: NEGATIVE mg/dL
Hgb urine dipstick: NEGATIVE
Ketones, ur: 5 mg/dL — AB
Leukocytes,Ua: NEGATIVE
Nitrite: NEGATIVE
Protein, ur: 30 mg/dL — AB
Specific Gravity, Urine: 1.023 (ref 1.005–1.030)
pH: 6 (ref 5.0–8.0)

## 2018-12-06 LAB — PREGNANCY, URINE: Preg Test, Ur: NEGATIVE

## 2018-12-06 LAB — WET PREP, GENITAL
Sperm: NONE SEEN
Trich, Wet Prep: NONE SEEN
Yeast Wet Prep HPF POC: NONE SEEN

## 2018-12-06 LAB — CHLAMYDIA/NGC RT PCR (ARMC ONLY): Chlamydia Tr: NOT DETECTED

## 2018-12-06 LAB — CHLAMYDIA/NGC RT PCR (ARMC ONLY)??????????: N gonorrhoeae: NOT DETECTED

## 2018-12-06 MED ORDER — SODIUM CHLORIDE 0.9 % IV BOLUS
1000.0000 mL | Freq: Once | INTRAVENOUS | Status: AC
  Administered 2018-12-06: 1000 mL via INTRAVENOUS

## 2018-12-06 MED ORDER — MORPHINE SULFATE (PF) 4 MG/ML IV SOLN: 4.0000 mg | Freq: Once | INTRAVENOUS | Status: DC

## 2018-12-06 MED ORDER — ONDANSETRON HCL 4 MG/2ML IJ SOLN
4.0000 mg | Freq: Once | INTRAMUSCULAR | Status: AC
  Administered 2018-12-06: 4 mg via INTRAVENOUS
  Filled 2018-12-06: qty 2

## 2018-12-06 MED ORDER — TRAMADOL HCL 50 MG PO TABS
50.0000 mg | ORAL_TABLET | Freq: Four times a day (QID) | ORAL | 0 refills | Status: DC | PRN
Start: 2018-12-06 — End: 2018-12-22

## 2018-12-06 MED ORDER — HYDROCODONE-ACETAMINOPHEN 5-325 MG PO TABS
1.0000 | ORAL_TABLET | Freq: Once | ORAL | Status: AC
  Administered 2018-12-06: 1 via ORAL
  Filled 2018-12-06: qty 1

## 2018-12-06 MED ORDER — DIPHENHYDRAMINE HCL 50 MG/ML IJ SOLN
25.0000 mg | Freq: Once | INTRAMUSCULAR | Status: AC
  Administered 2018-12-06: 25 mg via INTRAVENOUS
  Filled 2018-12-06: qty 1

## 2018-12-06 MED ORDER — KETOROLAC TROMETHAMINE 30 MG/ML IJ SOLN
30.0000 mg | Freq: Once | INTRAMUSCULAR | Status: AC
  Administered 2018-12-06: 30 mg via INTRAVENOUS
  Filled 2018-12-06: qty 1

## 2018-12-06 MED ORDER — CLINDAMYCIN HCL 150 MG PO CAPS: 300.0000 mg | ORAL_CAPSULE | Freq: Three times a day (TID) | ORAL | 0 refills | Status: DC

## 2018-12-06 NOTE — ED Triage Notes (Signed)
Pt also reports fever intermittently as well.

## 2018-12-06 NOTE — ED Notes (Signed)
ED Provider at bedside. 
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
## 2018-12-06 NOTE — ED Notes (Signed)
Pt attempting to provide another urine sample due to lab requesting another.

## 2018-12-06 NOTE — ED Triage Notes (Signed)
Pt reports has a migraine for a week. States has hx of migraines. Pt reports abd pain for the past week as well and back pain.

## 2018-12-06 NOTE — ED Notes (Signed)
Pt alert and oriented X4, active, cooperative, pt in NAD. RR even and unlabored, color WNL.  Pt informed to return if any life threatening symptoms occur.  Discharge and followup instructions reviewed. Ambulates safely. 

## 2018-12-06 NOTE — Discharge Instructions (Addendum)
Follow up with your regular doctor if not better in 3 days, return to the ER if worsening, use the medication as prescribed

## 2018-12-06 NOTE — ED Notes (Signed)
Pt states headache no better, pt requesting IV out. "I'm just ready to go, if I'm gonna feel this bad, I'd rather be at home feeling bad".
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
## 2018-12-22 ENCOUNTER — Encounter: Payer: Self-pay | Admitting: Neurology
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
## 2018-12-23 ENCOUNTER — Encounter: Payer: Self-pay | Admitting: Neurology

## 2018-12-23 ENCOUNTER — Other Ambulatory Visit: Payer: Self-pay

## 2018-12-23 ENCOUNTER — Telehealth (INDEPENDENT_AMBULATORY_CARE_PROVIDER_SITE_OTHER): Payer: Medicaid Other | Admitting: Neurology

## 2018-12-23 VITALS — Ht 60.0 in | Wt 150.0 lb

## 2018-12-23 DIAGNOSIS — M26609 Unspecified temporomandibular joint disorder, unspecified side: Secondary | ICD-10-CM

## 2018-12-23 DIAGNOSIS — R519 Headache, unspecified: Secondary | ICD-10-CM

## 2018-12-23 DIAGNOSIS — R51 Headache: Secondary | ICD-10-CM

## 2018-12-23 DIAGNOSIS — G501 Atypical facial pain: Secondary | ICD-10-CM | POA: Diagnosis not present

## 2018-12-23 NOTE — Addendum Note (Signed)
Addended by: Clois Comber on: 12/23/2018 03:49 PM   Modules accepted: Orders

## 2018-12-29 ENCOUNTER — Telehealth: Payer: Self-pay | Admitting: Neurology

## 2018-12-29 NOTE — Telephone Encounter (Signed)
Pt called checking status of both CT scans ordered 12/23/18. Pt states she is having a lot of pain and would like those scheduled as soon as possible. Informed pt awaiting authorization from ins. Pt ph [phone].

## 2018-12-29 NOTE — Telephone Encounter (Signed)
Called and provided Pt with [facility]'s contact information. She will call to check status.

## 2019-01-17 ENCOUNTER — Ambulatory Visit
Admission: RE | Admit: 2019-01-17 | Discharge: 2019-01-17 | Disposition: A | Discharge status: Home / Self Care | Payer: Medicaid Other | Source: Ambulatory Visit | Attending: Neurology | Admitting: Neurology

## 2019-01-17 DIAGNOSIS — R519 Headache, unspecified: Secondary | ICD-10-CM

## 2019-01-17 DIAGNOSIS — M26609 Unspecified temporomandibular joint disorder, unspecified side: Secondary | ICD-10-CM

## 2019-01-18 ENCOUNTER — Telehealth: Payer: Self-pay | Admitting: Neurology

## 2019-01-18 NOTE — Telephone Encounter (Signed)
Patient eft msg with after hours about cat scan and xray results. Thanks!

## 2019-01-18 NOTE — Telephone Encounter (Signed)
New Message  Patient verbalized she is calling to get results from scan yesterday.  Patient verbalized the tech at [facility] [place] [facility] stated Dr. Tomi Likens would have results back this morning.  Please f/u with patient

## 2019-01-19 ENCOUNTER — Telehealth: Payer: Self-pay

## 2019-01-19 MED ORDER — GABAPENTIN 100 MG PO CAPS: 300.0000 mg | ORAL_CAPSULE | ORAL | 3 refills | Status: DC

## 2019-01-19 NOTE — Telephone Encounter (Signed)
Left message for patient to call office to discuss results and recommendations. 

## 2019-01-19 NOTE — Telephone Encounter (Signed)
Spoke with Pt, advised her of results and gabapentin. Made appt for Pt for 4 months.

## 2019-01-19 NOTE — Telephone Encounter (Signed)
-----   Message from Pieter Partridge, DO sent at 01/19/2019  8:32 AM EDT ----- CT does not reveal any inflammation of the jaw.  Likely chronic pain from the fracture.  To try and treat the pain, we can start gabapentin 100mg  twice daily for one week, then 200mg  twice daily for one week, then 300mg  twice daily.  If pain not improved in 2 months, she may contact us and we can increase dose.  She should make a follow up appointment in 4 months.

## 2019-03-31 ENCOUNTER — Other Ambulatory Visit: Payer: Self-pay

## 2019-03-31 ENCOUNTER — Emergency Department
Admission: EM | Admit: 2019-03-31 | Discharge: 2019-03-31 | Disposition: A | Discharge status: Home / Self Care | Payer: Medicaid Other | Attending: Emergency Medicine | Admitting: Emergency Medicine

## 2019-03-31 DIAGNOSIS — R52 Pain, unspecified: Secondary | ICD-10-CM | POA: Diagnosis not present

## 2019-03-31 DIAGNOSIS — Z91010 Allergy to peanuts: Secondary | ICD-10-CM | POA: Insufficient documentation

## 2019-03-31 DIAGNOSIS — Z5321 Procedure and treatment not carried out due to patient leaving prior to being seen by health care provider: Secondary | ICD-10-CM | POA: Diagnosis not present

## 2019-03-31 DIAGNOSIS — L299 Pruritus, unspecified: Secondary | ICD-10-CM | POA: Insufficient documentation

## 2019-03-31 NOTE — ED Notes (Signed)
Pt called 2 more times without response.

## 2019-03-31 NOTE — ED Notes (Signed)
Pt called without response 
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
## 2019-04-13 ENCOUNTER — Other Ambulatory Visit: Payer: Self-pay

## 2019-04-13 ENCOUNTER — Emergency Department (HOSPITAL_COMMUNITY)
Admission: EM | Admit: 2019-04-13 | Discharge: 2019-04-13 | Disposition: A | Discharge status: Home / Self Care | Payer: Medicaid Other | Attending: Emergency Medicine | Admitting: Emergency Medicine

## 2019-04-13 ENCOUNTER — Emergency Department (HOSPITAL_COMMUNITY): Payer: Medicaid Other

## 2019-04-13 ENCOUNTER — Encounter (HOSPITAL_COMMUNITY): Payer: Self-pay

## 2019-04-13 DIAGNOSIS — N730 Acute parametritis and pelvic cellulitis: Secondary | ICD-10-CM

## 2019-04-13 DIAGNOSIS — N739 Female pelvic inflammatory disease, unspecified: Secondary | ICD-10-CM | POA: Insufficient documentation

## 2019-04-13 DIAGNOSIS — Z91040 Latex allergy status: Secondary | ICD-10-CM | POA: Insufficient documentation

## 2019-04-13 DIAGNOSIS — Y939 Activity, unspecified: Secondary | ICD-10-CM | POA: Insufficient documentation

## 2019-04-13 DIAGNOSIS — M25532 Pain in left wrist: Secondary | ICD-10-CM | POA: Insufficient documentation

## 2019-04-13 DIAGNOSIS — Y999 Unspecified external cause status: Secondary | ICD-10-CM | POA: Diagnosis not present

## 2019-04-13 DIAGNOSIS — S299XXA Unspecified injury of thorax, initial encounter: Secondary | ICD-10-CM | POA: Diagnosis present

## 2019-04-13 DIAGNOSIS — Z79899 Other long term (current) drug therapy: Secondary | ICD-10-CM | POA: Insufficient documentation

## 2019-04-13 DIAGNOSIS — F17210 Nicotine dependence, cigarettes, uncomplicated: Secondary | ICD-10-CM | POA: Insufficient documentation

## 2019-04-13 DIAGNOSIS — Y929 Unspecified place or not applicable: Secondary | ICD-10-CM | POA: Insufficient documentation

## 2019-04-13 DIAGNOSIS — J45909 Unspecified asthma, uncomplicated: Secondary | ICD-10-CM | POA: Diagnosis not present

## 2019-04-13 DIAGNOSIS — R079 Chest pain, unspecified: Secondary | ICD-10-CM | POA: Diagnosis not present

## 2019-04-13 LAB — URINALYSIS, ROUTINE W REFLEX MICROSCOPIC
Bacteria, UA: NONE SEEN
Bilirubin Urine: NEGATIVE
Glucose, UA: NEGATIVE mg/dL
Hgb urine dipstick: NEGATIVE
Ketones, ur: NEGATIVE mg/dL
Leukocytes,Ua: NEGATIVE
Nitrite: NEGATIVE
Protein, ur: 30 mg/dL — AB
Specific Gravity, Urine: 1.034 — ABNORMAL HIGH (ref 1.005–1.030)
pH: 5 (ref 5.0–8.0)

## 2019-04-13 LAB — COMPREHENSIVE METABOLIC PANEL
ALT: 19 U/L (ref 0–44)
AST: 18 U/L (ref 15–41)
Albumin: 4.5 g/dL (ref 3.5–5.0)
Alkaline Phosphatase: 53 U/L (ref 38–126)
Anion gap: 6 (ref 5–15)
BUN: 17 mg/dL (ref 6–20)
CO2: 27 mmol/L (ref 22–32)
Calcium: 9.2 mg/dL (ref 8.9–10.3)
Chloride: 105 mmol/L (ref 98–111)
Creatinine, Ser: 0.87 mg/dL (ref 0.44–1.00)
GFR calc Af Amer: >60 mL/min (ref >60)
GFR calc non Af Amer: >60 mL/min (ref >60)
Glucose, Bld: 92 mg/dL (ref 70–99)
Potassium: 4.1 mmol/L (ref 3.5–5.1)
Sodium: 138 mmol/L (ref 135–145)
Total Bilirubin: 0.6 mg/dL (ref 0.3–1.2)
Total Protein: 8 g/dL (ref 6.5–8.1)

## 2019-04-13 LAB — WET PREP, GENITAL
Clue Cells Wet Prep HPF POC: NONE SEEN
Trich, Wet Prep: NONE SEEN
Yeast Wet Prep HPF POC: NONE SEEN

## 2019-04-13 LAB — RAPID HIV SCREEN (HIV 1/2 AB+AG)
HIV 1/2 Antibodies: NONREACTIVE
HIV-1 P24 Antigen - HIV24: NONREACTIVE

## 2019-04-13 LAB — POC URINE PREG, ED: Preg Test, Ur: NEGATIVE

## 2019-04-13 LAB — HEPATITIS B SURFACE ANTIGEN: Hepatitis B Surface Ag: NONREACTIVE

## 2019-04-13 MED ORDER — ONDANSETRON HCL 4 MG PO TABS
4.0000 mg | ORAL_TABLET | Freq: Once | ORAL | Status: AC
  Administered 2019-04-13: 4 mg via ORAL
  Filled 2019-04-13: qty 1

## 2019-04-13 MED ORDER — ACETAMINOPHEN 325 MG PO TABS: 650.0000 mg | ORAL_TABLET | Freq: Once | ORAL | Status: DC

## 2019-04-13 MED ORDER — ULIPRISTAL ACETATE 30 MG PO TABS
30.0000 mg | ORAL_TABLET | Freq: Once | ORAL | Status: AC
  Administered 2019-04-13: 14:00:00 30 mg via ORAL
  Filled 2019-04-13: qty 1

## 2019-04-13 MED ORDER — LIDOCAINE HCL (PF) 1 % IJ SOLN
0.9000 mL | Freq: Once | INTRAMUSCULAR | Status: AC
  Administered 2019-04-13: 15:00:00 0.9 mL
  Filled 2019-04-13: qty 2

## 2019-04-13 MED ORDER — AZITHROMYCIN 250 MG PO TABS
1000.0000 mg | ORAL_TABLET | Freq: Once | ORAL | Status: AC
  Administered 2019-04-13: 15:00:00 1000 mg via ORAL
  Filled 2019-04-13: qty 4

## 2019-04-13 MED ORDER — ELVITEG-COBIC-EMTRICIT-TENOFAF 150-150-200-10 MG PREPACK
5.0000 | ORAL_TABLET | Freq: Once | ORAL | Status: AC
  Administered 2019-04-13: 15:00:00 5 via ORAL
  Filled 2019-04-13: qty 1

## 2019-04-13 MED ORDER — CEFTRIAXONE SODIUM 250 MG IJ SOLR
250.0000 mg | Freq: Once | INTRAMUSCULAR | Status: AC
  Administered 2019-04-13: 250 mg via INTRAMUSCULAR
  Filled 2019-04-13: qty 250

## 2019-04-13 MED ORDER — ELVITEG-COBIC-EMTRICIT-TENOFAF 150-150-200-10 MG PO TABS: 1.0000 | ORAL_TABLET | Freq: Every day | ORAL | 0 refills | Status: DC

## 2019-04-13 MED ORDER — DOXYCYCLINE HYCLATE 100 MG PO CAPS: 100.0000 mg | ORAL_CAPSULE | Freq: Two times a day (BID) | ORAL | 0 refills | Status: AC
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
## 2019-04-13 NOTE — Discharge Instructions (Addendum)
You were given a prescription for antibiotics. Please take the antibiotic prescription fully.   Take genvoya as prescribed.   Please follow up with your primary care provider within 5-7 days for re-evaluation of your symptoms. If you do not have a primary care provider, information for a healthcare clinic has been provided for you to make arrangements for follow up care. Please return to the emergency department for any new or worsening symptoms.
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
## 2019-04-13 NOTE — ED Notes (Signed)
SANE nurse speaking with pt on portable phone.
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
## 2019-04-14 LAB — MOLECULAR ANCILLARY ONLY
Chlamydia: NEGATIVE
Molecular Disclaimer: NEGATIVE
Molecular Disclaimer: NORMAL
Neisseria Gonorrhea: NEGATIVE

## 2019-04-14 LAB — HEPATITIS C ANTIBODY: HCV Ab: NONREACTIVE

## 2019-04-14 LAB — RPR: RPR Ser Ql: NONREACTIVE

## 2019-04-22 ENCOUNTER — Ambulatory Visit: Payer: Medicaid Other | Admitting: Orthopedic Surgery

## 2019-04-22 ENCOUNTER — Encounter: Payer: Self-pay | Admitting: Orthopedic Surgery
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
## 2019-05-27 ENCOUNTER — Ambulatory Visit: Payer: Medicaid Other | Admitting: Neurology

## 2019-06-06 ENCOUNTER — Ambulatory Visit: Payer: Medicaid Other | Admitting: Orthopedic Surgery

## 2019-06-06 ENCOUNTER — Encounter: Payer: Self-pay | Admitting: Orthopedic Surgery

## 2019-08-25 ENCOUNTER — Ambulatory Visit: Payer: Medicaid Other | Attending: Internal Medicine

## 2019-08-25 ENCOUNTER — Other Ambulatory Visit: Payer: Self-pay

## 2019-08-25 DIAGNOSIS — Z20822 Contact with and (suspected) exposure to covid-19: Secondary | ICD-10-CM

## 2019-08-26 LAB — NOVEL CORONAVIRUS, NAA: SARS-CoV-2, NAA: NOT DETECTED

## 2019-09-10 ENCOUNTER — Other Ambulatory Visit: Payer: Self-pay

## 2019-09-10 ENCOUNTER — Encounter (HOSPITAL_COMMUNITY): Payer: Self-pay | Admitting: Emergency Medicine

## 2019-09-10 ENCOUNTER — Emergency Department (HOSPITAL_COMMUNITY)
Admission: EM | Admit: 2019-09-10 | Discharge: 2019-09-10 | Disposition: A | Discharge status: Home / Self Care | Payer: Medicaid Other | Attending: Emergency Medicine | Admitting: Emergency Medicine

## 2019-09-10 DIAGNOSIS — F17210 Nicotine dependence, cigarettes, uncomplicated: Secondary | ICD-10-CM | POA: Insufficient documentation

## 2019-09-10 DIAGNOSIS — Z91040 Latex allergy status: Secondary | ICD-10-CM | POA: Diagnosis not present

## 2019-09-10 DIAGNOSIS — Z79899 Other long term (current) drug therapy: Secondary | ICD-10-CM | POA: Diagnosis not present

## 2019-09-10 DIAGNOSIS — R102 Pelvic and perineal pain: Secondary | ICD-10-CM

## 2019-09-10 DIAGNOSIS — B9689 Other specified bacterial agents as the cause of diseases classified elsewhere: Secondary | ICD-10-CM

## 2019-09-10 DIAGNOSIS — N760 Acute vaginitis: Secondary | ICD-10-CM | POA: Diagnosis not present

## 2019-09-10 DIAGNOSIS — J45909 Unspecified asthma, uncomplicated: Secondary | ICD-10-CM | POA: Diagnosis not present

## 2019-09-10 LAB — WET PREP, GENITAL
Sperm: NONE SEEN
Trich, Wet Prep: NONE SEEN
WBC, Wet Prep HPF POC: NONE SEEN
Yeast Wet Prep HPF POC: NONE SEEN

## 2019-09-10 LAB — URINALYSIS, ROUTINE W REFLEX MICROSCOPIC
Bacteria, UA: NONE SEEN
Bilirubin Urine: NEGATIVE
Glucose, UA: NEGATIVE mg/dL
Ketones, ur: NEGATIVE mg/dL
Leukocytes,Ua: NEGATIVE
Nitrite: NEGATIVE
Protein, ur: NEGATIVE mg/dL
Specific Gravity, Urine: 1.019 (ref 1.005–1.030)
pH: 6 (ref 5.0–8.0)

## 2019-09-10 LAB — POC URINE PREG, ED: Preg Test, Ur: NEGATIVE

## 2019-09-10 MED ORDER — DOXYCYCLINE HYCLATE 100 MG PO CAPS: 100.0000 mg | ORAL_CAPSULE | Freq: Two times a day (BID) | ORAL | 0 refills | Status: AC

## 2019-09-10 MED ORDER — DOXYCYCLINE HYCLATE 100 MG PO TABS
100.0000 mg | ORAL_TABLET | Freq: Once | ORAL | Status: AC
  Administered 2019-09-10: 22:00:00 100 mg via ORAL
  Filled 2019-09-10: qty 1

## 2019-09-10 MED ORDER — CEFTRIAXONE SODIUM 500 MG IJ SOLR
500.0000 mg | Freq: Once | INTRAMUSCULAR | Status: AC
  Administered 2019-09-10: 22:00:00 500 mg via INTRAMUSCULAR
  Filled 2019-09-10: qty 500

## 2019-09-10 MED ORDER — CLINDAMYCIN HCL 150 MG PO CAPS: 300.0000 mg | ORAL_CAPSULE | Freq: Two times a day (BID) | ORAL | 0 refills | Status: AC

## 2019-09-10 MED ORDER — NAPROXEN 500 MG PO TABS: 500.0000 mg | ORAL_TABLET | Freq: Two times a day (BID) | ORAL | 0 refills | Status: AC
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
## 2019-09-10 NOTE — Discharge Instructions (Signed)
Please read the attached instructions regarding bacterial vaginosis, this is a vaginal infection that is treated with a medicine called clindamycin which we have prescribed for you because of your allergy to metronidazole.  Please stop taking other peoples pain medications, not only is this illegal it is dangerous for you.  You may take naproxen twice daily as needed for pain, seek medical exam for severe or worsening symptoms  Talk to your family doctor on Monday for repeat exam if still having pain
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
## 2019-09-13 LAB — GC/CHLAMYDIA PROBE AMP (~~LOC~~) NOT AT ARMC
Chlamydia: NEGATIVE
Neisseria Gonorrhea: NEGATIVE

## 2019-09-23 ENCOUNTER — Telehealth: Payer: Medicaid Other | Admitting: Nurse Practitioner

## 2019-09-23 DIAGNOSIS — M545 Low back pain, unspecified: Secondary | ICD-10-CM

## 2019-09-23 DIAGNOSIS — R300 Dysuria: Secondary | ICD-10-CM

## 2019-09-23 DIAGNOSIS — R1084 Generalized abdominal pain: Secondary | ICD-10-CM

## 2019-09-23 NOTE — Progress Notes (Signed)
Based on what you shared with me it looks like you have several issues ,that should be evaluated in a face to face office visit. You will need someone  To look at lesions on legs as well as check your urine and vaginal discharge. linzess is not on your current medication list so you will need to get that from your PCP. We cannot start chronic medication in an e visit.     NOTE: If you entered your credit card information for this eVisit, you will not be charged. You may see a "hold" on your card for the $35 but that hold will drop off and you will not have a charge processed.  If you are having a true medical emergency please call 911.     For an urgent face to face visit, [place] has four urgent care centers for your convenience:   . [facility]    [phone]                  Get Driving Directions  [address] [place], [place] [address] . 10 am to 8 pm Monday-Friday . 12 pm to 8 pm Saturday-Sunday   . [facility] at [facility]  [phone]                  Get Driving Directions  [address] [place], [place] [address] . 8 am to 8 pm Monday-Friday . 9 am to 6 pm Saturday . 11 am to 6 pm Sunday   . [facility] at [facility]  [phone]                  Get Driving Directions   [address] [place], [place] [address] . 8 am to 8 pm Monday-Friday . 8 am to 4 pm Saturday-Sunday    . [facility] at [facility] Directions  [phone]  [address] [place], [place] [address]  . Monday-Friday, 12 PM to 6 PM    Your e-visit answers were reviewed by a board certified advanced clinical practitioner to complete your personal care plan.  Thank you for using e-Visits.

## 2020-01-03 ENCOUNTER — Emergency Department: Payer: Medicaid Other

## 2020-01-03 ENCOUNTER — Emergency Department
Admission: EM | Admit: 2020-01-03 | Discharge: 2020-01-03 | Disposition: A | Discharge status: Home / Self Care | Payer: Medicaid Other | Attending: Emergency Medicine | Admitting: Emergency Medicine

## 2020-01-03 ENCOUNTER — Other Ambulatory Visit: Payer: Self-pay

## 2020-01-03 ENCOUNTER — Encounter: Payer: Self-pay | Admitting: Emergency Medicine

## 2020-01-03 DIAGNOSIS — F411 Generalized anxiety disorder: Secondary | ICD-10-CM

## 2020-01-03 DIAGNOSIS — Z91010 Allergy to peanuts: Secondary | ICD-10-CM | POA: Diagnosis not present

## 2020-01-03 DIAGNOSIS — F17210 Nicotine dependence, cigarettes, uncomplicated: Secondary | ICD-10-CM | POA: Diagnosis not present

## 2020-01-03 DIAGNOSIS — R0981 Nasal congestion: Secondary | ICD-10-CM | POA: Diagnosis not present

## 2020-01-03 DIAGNOSIS — Z91040 Latex allergy status: Secondary | ICD-10-CM | POA: Diagnosis not present

## 2020-01-03 DIAGNOSIS — F1290 Cannabis use, unspecified, uncomplicated: Secondary | ICD-10-CM | POA: Insufficient documentation

## 2020-01-03 DIAGNOSIS — Z20822 Contact with and (suspected) exposure to covid-19: Secondary | ICD-10-CM | POA: Insufficient documentation

## 2020-01-03 DIAGNOSIS — R519 Headache, unspecified: Secondary | ICD-10-CM | POA: Diagnosis present

## 2020-01-03 LAB — SARS CORONAVIRUS 2 BY RT PCR (HOSPITAL ORDER, PERFORMED IN ~~LOC~~ HOSPITAL LAB): SARS Coronavirus 2: NEGATIVE

## 2020-01-03 MED ORDER — KETOROLAC TROMETHAMINE 10 MG PO TABS: 10.0000 mg | ORAL_TABLET | Freq: Three times a day (TID) | ORAL | 0 refills | Status: DC

## 2020-01-03 MED ORDER — KETOROLAC TROMETHAMINE 30 MG/ML IJ SOLN
30.0000 mg | Freq: Once | INTRAMUSCULAR | Status: AC
  Administered 2020-01-03: 30 mg via INTRAMUSCULAR
  Filled 2020-01-03: qty 1
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
## 2020-01-03 NOTE — ED Notes (Signed)
Pt also c/o loss of taste. During assessment pt. Became upset stating "please don't let me die" reassured pt. Pt was calm and in NAD before leaving room.

## 2020-01-03 NOTE — Discharge Instructions (Signed)
Your exam and CT scan are negative at this time.  Chest x-ray did not reveal any infection or pneumonia.  You should follow-up with your primary provider or return to the ED as needed.

## 2020-01-03 NOTE — ED Triage Notes (Signed)
Pt here for headache, loss of taste, and nasal congestion starting today.  Headache is pressure to back of head.  Pt states "my cousin has been smoking crystal meth around me and not sure if that is causing this".

## 2020-01-03 NOTE — ED Notes (Signed)
Pt trx to CT.  
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
## 2020-01-03 NOTE — ED Notes (Signed)
Pt called out for "an uneasy feeling in her chest and a dry throat." PA made aware.

## 2020-01-09 ENCOUNTER — Other Ambulatory Visit: Payer: Self-pay

## 2020-01-09 ENCOUNTER — Ambulatory Visit (INDEPENDENT_AMBULATORY_CARE_PROVIDER_SITE_OTHER): Payer: Medicaid Other | Admitting: Neurology

## 2020-01-09 ENCOUNTER — Encounter: Payer: Self-pay | Admitting: Neurology

## 2020-01-09 VITALS — BP 165/76 | HR 100

## 2020-01-09 DIAGNOSIS — R519 Headache, unspecified: Secondary | ICD-10-CM

## 2020-01-09 DIAGNOSIS — R200 Anesthesia of skin: Secondary | ICD-10-CM | POA: Diagnosis not present

## 2020-01-09 MED ORDER — NORTRIPTYLINE HCL 10 MG PO CAPS: 10.0000 mg | ORAL_CAPSULE | Freq: Every day | ORAL | 3 refills | Status: DC

## 2020-01-09 MED ORDER — PREDNISONE 10 MG PO TABS
ORAL_TABLET | ORAL | 0 refills | Status: DC
Start: 2020-01-09 — End: 2020-01-23
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
## 2020-01-09 NOTE — Patient Instructions (Addendum)
1.  Start steroid/prednisone taper:  Take as directed. 2.  Start nortriptyline 10mg  at bedtime.  We can increase dose in 4 weeks if needed. 3.  Limit use of pain relievers to no more than 2 days out of week to prevent risk of rebound or medication-overuse headache.  Do not treat with oxycodone. 4.  MRI of brain. We have sent a referral to [facility] for your MRI and they will call you directly to schedule your appointment. They are located at [address] [facility] ([facility] If you need to contact them directly please call [phone].  5.  Follow up in 4 months.

## 2020-01-20 ENCOUNTER — Emergency Department (HOSPITAL_COMMUNITY)
Admission: EM | Admit: 2020-01-20 | Discharge: 2020-01-20 | Discharge status: Against Medical Advice | Payer: Medicaid Other

## 2020-01-20 ENCOUNTER — Other Ambulatory Visit: Payer: Self-pay

## 2020-01-22 ENCOUNTER — Emergency Department
Admission: EM | Admit: 2020-01-22 | Discharge: 2020-01-23 | Disposition: A | Discharge status: Other Acute Inpatient Hospital | Payer: Medicaid Other | Attending: Emergency Medicine | Admitting: Emergency Medicine

## 2020-01-22 ENCOUNTER — Other Ambulatory Visit: Payer: Self-pay

## 2020-01-22 DIAGNOSIS — F17210 Nicotine dependence, cigarettes, uncomplicated: Secondary | ICD-10-CM | POA: Insufficient documentation

## 2020-01-22 DIAGNOSIS — Z20822 Contact with and (suspected) exposure to covid-19: Secondary | ICD-10-CM | POA: Insufficient documentation

## 2020-01-22 DIAGNOSIS — Z91010 Allergy to peanuts: Secondary | ICD-10-CM | POA: Insufficient documentation

## 2020-01-22 DIAGNOSIS — Z79899 Other long term (current) drug therapy: Secondary | ICD-10-CM | POA: Insufficient documentation

## 2020-01-22 DIAGNOSIS — Z91040 Latex allergy status: Secondary | ICD-10-CM | POA: Insufficient documentation

## 2020-01-22 DIAGNOSIS — J45909 Unspecified asthma, uncomplicated: Secondary | ICD-10-CM | POA: Insufficient documentation

## 2020-01-22 DIAGNOSIS — F22 Delusional disorders: Secondary | ICD-10-CM | POA: Insufficient documentation

## 2020-01-22 LAB — URINE DRUG SCREEN, QUALITATIVE (ARMC ONLY)
Amphetamines, Ur Screen: NOT DETECTED
Barbiturates, Ur Screen: NOT DETECTED
Benzodiazepine, Ur Scrn: NOT DETECTED
Cannabinoid 50 Ng, Ur ~~LOC~~: POSITIVE — AB
Cocaine Metabolite,Ur ~~LOC~~: NOT DETECTED
MDMA (Ecstasy)Ur Screen: NOT DETECTED
Methadone Scn, Ur: NOT DETECTED
Opiate, Ur Screen: POSITIVE — AB
Phencyclidine (PCP) Ur S: NOT DETECTED
Tricyclic, Ur Screen: NOT DETECTED

## 2020-01-22 LAB — COMPREHENSIVE METABOLIC PANEL
ALT: 10 U/L (ref 0–44)
AST: 14 U/L — ABNORMAL LOW (ref 15–41)
Albumin: 4.6 g/dL (ref 3.5–5.0)
Alkaline Phosphatase: 61 U/L (ref 38–126)
Anion gap: 10 (ref 5–15)
BUN: 6 mg/dL (ref 6–20)
CO2: 22 mmol/L (ref 22–32)
Calcium: 8.9 mg/dL (ref 8.9–10.3)
Chloride: 104 mmol/L (ref 98–111)
Creatinine, Ser: 0.75 mg/dL (ref 0.44–1.00)
GFR calc Af Amer: >60 mL/min (ref >60)
GFR calc non Af Amer: >60 mL/min (ref >60)
Glucose, Bld: 116 mg/dL — ABNORMAL HIGH (ref 70–99)
Potassium: 3 mmol/L — ABNORMAL LOW (ref 3.5–5.1)
Sodium: 136 mmol/L (ref 135–145)
Total Bilirubin: 0.8 mg/dL (ref 0.3–1.2)
Total Protein: 7.9 g/dL (ref 6.5–8.1)

## 2020-01-22 LAB — CBC
HCT: 40.4 % (ref 36.0–46.0)
Hemoglobin: 14 g/dL (ref 12.0–15.0)
MCH: 32.8 pg (ref 26.0–34.0)
MCHC: 34.7 g/dL (ref 30.0–36.0)
MCV: 94.6 fL (ref 80.0–100.0)
Platelets: 358 10*3/uL (ref 150–400)
RBC: 4.27 MIL/uL (ref 3.87–5.11)
RDW: 13 % (ref 11.5–15.5)
WBC: 11.7 10*3/uL — ABNORMAL HIGH (ref 4.0–10.5)
nRBC: 0 % (ref 0.0–0.2)

## 2020-01-22 LAB — POCT PREGNANCY, URINE: Preg Test, Ur: NEGATIVE

## 2020-01-22 LAB — LITHIUM LEVEL: Lithium Lvl: <0.06 mmol/L — ABNORMAL LOW (ref 0.60–1.20)

## 2020-01-22 LAB — ACETAMINOPHEN LEVEL: Acetaminophen (Tylenol), Serum: <10 ug/mL — ABNORMAL LOW (ref 10–30)

## 2020-01-22 LAB — SALICYLATE LEVEL: Salicylate Lvl: <7 mg/dL — ABNORMAL LOW (ref 7.0–30.0)

## 2020-01-22 LAB — ETHANOL: Alcohol, Ethyl (B): <10 mg/dL (ref <10)

## 2020-01-22 MED ORDER — HALOPERIDOL 5 MG PO TABS
5.0000 mg | ORAL_TABLET | Freq: Once | ORAL | Status: AC
  Administered 2020-01-22: 5 mg via ORAL
  Filled 2020-01-22: qty 1

## 2020-01-22 MED ORDER — LORAZEPAM 1 MG PO TABS
1.0000 mg | ORAL_TABLET | Freq: Once | ORAL | Status: AC
  Administered 2020-01-22: 1 mg via ORAL
  Filled 2020-01-22: qty 1
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
## 2020-01-22 NOTE — ED Notes (Signed)
Pt. Transferred from Triage to room 22 after dressing out and screening for contraband. Pt. Oriented to Quad including Q15 minute rounds as well as Psychologist, counselling for their protection. Patient is alert and oriented, warm and dry in no acute distress. Patient denies SI, HI, and AH but states she sees"lights flashing". Pt. Encouraged to let me know if needs arise.

## 2020-01-22 NOTE — ED Notes (Signed)
This RN and Selena Batten NT changed patient into hospital provided scrubs. Patient's belonging's placed into labeled bag. Patient's belonging's include.    Floral shirt, white undershirt, purple underwear, grey sweat pants, license, mask, phone, open pack of cigarettes, keys, 2 metal rings, 1 metal nose ring.

## 2020-01-22 NOTE — ED Notes (Signed)
Hourly rounding reveals patient awake in room. Stable, in no acute distress. Q15 minute rounds and monitoring via Rover and Officer to continue.  

## 2020-01-22 NOTE — ED Triage Notes (Signed)
Patient reports she believes multiple snakes are moving around inside of her. Patient denies SI/HI.

## 2020-01-23 ENCOUNTER — Encounter: Payer: Self-pay | Admitting: Psychiatry

## 2020-01-23 ENCOUNTER — Other Ambulatory Visit: Payer: Self-pay

## 2020-01-23 ENCOUNTER — Inpatient Hospital Stay
Admission: AD | Admit: 2020-01-23 | Discharge: 2020-02-01 | DRG: 885 | Disposition: A | Discharge status: Home / Self Care | Payer: Medicaid Other | Source: Intra-hospital | Attending: Internal Medicine | Admitting: Internal Medicine

## 2020-01-23 DIAGNOSIS — F1120 Opioid dependence, uncomplicated: Secondary | ICD-10-CM | POA: Diagnosis present

## 2020-01-23 DIAGNOSIS — F4320 Adjustment disorder, unspecified: Secondary | ICD-10-CM | POA: Diagnosis present

## 2020-01-23 DIAGNOSIS — F411 Generalized anxiety disorder: Secondary | ICD-10-CM | POA: Diagnosis present

## 2020-01-23 DIAGNOSIS — F17210 Nicotine dependence, cigarettes, uncomplicated: Secondary | ICD-10-CM | POA: Diagnosis present

## 2020-01-23 DIAGNOSIS — G4700 Insomnia, unspecified: Secondary | ICD-10-CM | POA: Diagnosis present

## 2020-01-23 DIAGNOSIS — F315 Bipolar disorder, current episode depressed, severe, with psychotic features: Principal | ICD-10-CM | POA: Diagnosis present

## 2020-01-23 DIAGNOSIS — F22 Delusional disorders: Secondary | ICD-10-CM | POA: Diagnosis present

## 2020-01-23 DIAGNOSIS — F1110 Opioid abuse, uncomplicated: Secondary | ICD-10-CM

## 2020-01-23 DIAGNOSIS — F333 Major depressive disorder, recurrent, severe with psychotic symptoms: Secondary | ICD-10-CM | POA: Diagnosis not present

## 2020-01-23 DIAGNOSIS — Z23 Encounter for immunization: Secondary | ICD-10-CM | POA: Diagnosis not present

## 2020-01-23 LAB — SARS CORONAVIRUS 2 BY RT PCR (HOSPITAL ORDER, PERFORMED IN ~~LOC~~ HOSPITAL LAB): SARS Coronavirus 2: NEGATIVE

## 2020-01-23 MED ORDER — OLANZAPINE 5 MG PO TABS: 2.5000 mg | ORAL_TABLET | Freq: Every day | ORAL | Status: DC

## 2020-01-23 MED ORDER — CITALOPRAM HYDROBROMIDE 20 MG PO TABS
20.0000 mg | ORAL_TABLET | Freq: Every day | ORAL | Status: DC
  Administered 2020-01-23: 20 mg via ORAL
  Filled 2020-01-23: qty 1

## 2020-01-23 MED ORDER — BENZTROPINE MESYLATE 1 MG PO TABS
0.5000 mg | ORAL_TABLET | Freq: Two times a day (BID) | ORAL | Status: DC
  Administered 2020-01-23: 0.5 mg via ORAL
  Filled 2020-01-23: qty 1

## 2020-01-23 MED ORDER — ACETAMINOPHEN 325 MG PO TABS
650.0000 mg | ORAL_TABLET | Freq: Four times a day (QID) | ORAL | Status: DC | PRN
  Administered 2020-01-23 (×2): 650 mg via ORAL
  Filled 2020-01-23 (×2): qty 2

## 2020-01-23 MED ORDER — MENTHOL 3 MG MT LOZG
1.0000 | LOZENGE | OROMUCOSAL | Status: DC | PRN
  Filled 2020-01-23 (×3): qty 9

## 2020-01-23 MED ORDER — ACETAMINOPHEN 325 MG PO TABS
650.0000 mg | ORAL_TABLET | Freq: Four times a day (QID) | ORAL | Status: DC | PRN
  Administered 2020-01-24 – 2020-01-31 (×13): 650 mg via ORAL
  Filled 2020-01-23 (×14): qty 2

## 2020-01-23 MED ORDER — HYDROXYZINE HCL 50 MG PO TABS
50.0000 mg | ORAL_TABLET | Freq: Three times a day (TID) | ORAL | Status: DC | PRN
  Administered 2020-01-23 – 2020-02-01 (×14): 50 mg via ORAL
  Filled 2020-01-23 (×14): qty 1

## 2020-01-23 MED ORDER — METHOCARBAMOL 500 MG PO TABS
750.0000 mg | ORAL_TABLET | Freq: Four times a day (QID) | ORAL | Status: DC | PRN
  Administered 2020-01-23 – 2020-01-24 (×2): 750 mg via ORAL
  Filled 2020-01-23 (×2): qty 2

## 2020-01-23 MED ORDER — PNEUMOCOCCAL VAC POLYVALENT 25 MCG/0.5ML IJ INJ
0.5000 mL | INJECTION | INTRAMUSCULAR | Status: AC
  Administered 2020-01-24: 0.5 mL via INTRAMUSCULAR
  Filled 2020-01-23 (×2): qty 0.5

## 2020-01-23 MED ORDER — ONDANSETRON 4 MG PO TBDP
4.0000 mg | ORAL_TABLET | Freq: Four times a day (QID) | ORAL | Status: DC | PRN
  Administered 2020-01-23 – 2020-01-24 (×2): 4 mg via ORAL
  Filled 2020-01-23 (×2): qty 1

## 2020-01-23 MED ORDER — ALUM & MAG HYDROXIDE-SIMETH 200-200-20 MG/5ML PO SUSP: 30.0000 mL | ORAL | Status: DC | PRN

## 2020-01-23 MED ORDER — HALOPERIDOL 0.5 MG PO TABS
0.5000 mg | ORAL_TABLET | Freq: Two times a day (BID) | ORAL | Status: DC
  Administered 2020-01-23: 0.5 mg via ORAL
  Filled 2020-01-23 (×2): qty 1

## 2020-01-23 MED ORDER — MAGNESIUM HYDROXIDE 400 MG/5ML PO SUSP
30.0000 mL | Freq: Every day | ORAL | Status: DC | PRN
  Administered 2020-01-25 – 2020-02-01 (×3): 30 mL via ORAL
  Filled 2020-01-23 (×3): qty 30

## 2020-01-23 MED ORDER — TRAZODONE HCL 100 MG PO TABS
100.0000 mg | ORAL_TABLET | Freq: Every evening | ORAL | Status: DC | PRN
  Administered 2020-01-23 – 2020-01-30 (×7): 100 mg via ORAL
  Filled 2020-01-23 (×7): qty 1

## 2020-01-23 MED ORDER — MENTHOL 3 MG MT LOZG
1.0000 | LOZENGE | OROMUCOSAL | Status: DC | PRN
  Administered 2020-01-23 – 2020-01-31 (×4): 3 mg via ORAL
  Filled 2020-01-23: qty 9

## 2020-01-23 NOTE — [facility] Group Notes (Signed)
[facility] Group Notes:  (Nursing/MHT/Case Management/Adjunct)  Date:  01/23/2020  Time:  9:29 PM  Type of Therapy:  Group Therapy  Participation Level:  Active  Participation Quality:  Appropriate  Affect:  Appropriate  Cognitive:  Alert  Insight:  Good  Engagement in Group:  Engaged and to get clean.  Modes of Intervention:  Support  Summary of Progress/Problems:  Mayra Neer 01/23/2020, 9:29 PM

## 2020-01-23 NOTE — ED Notes (Signed)
Hourly rounding reveals patient sleeping in room. No complaints, stable, in no acute distress. Q15 minute rounds and monitoring via Rover and Officer to continue.  

## 2020-01-23 NOTE — ED Notes (Signed)
Patient ate 100% of lunch and beverage.  

## 2020-01-23 NOTE — ED Notes (Signed)
Patient in room states that she feels sick at stomach, she was gagging, states that it feels like sinus stuff, Patient given soft drink and crackers, she then stood up and feels dizzy, Nurse will obtain v/S. Patient thinks that someone is smoking crystal meth in the hospital and complaining about the smell, Nurse had to reassure her that no one is smoking here, but CNA did spray room with air freshener.

## 2020-01-23 NOTE — ED Notes (Signed)
Pt provided with a cup of sprite. Pt has no further needs at this time.

## 2020-01-23 NOTE — ED Notes (Signed)
Pt given a warm blanket 

## 2020-01-23 NOTE — BH Assessment (Signed)
Patient can come down after 8pm  Call to give report: [phone]  Patient is to be admitted to [facility] by Dr. Toni Amend.  Attending Physician will be. Dr. Toni Amend.   Patient has been assigned to room 307, by [facility] Charge Nurse Gigi   Intake Paper Work has been signed and placed on patient chart.  ER staff is aware of the admission: 1. Nitchia, ER Secretary  2. Paduchowski, ER MD  3. Toniann Fail Patient's Nurse  4. THO Patient Access.

## 2020-01-23 NOTE — ED Notes (Signed)
Patient denies Si/hi or avh at this time, states that she feels better and really did want help, and she wants to be honest about all things, because she knows that it the only way to receive real help, she talked about her long list of psych issues, diagnoses, and how she doesn't take her medicine as she should and will mix medications at times, she is upset because she has not seen the psych Doctor as of yet, but nurse let her know that Doctor would be seeing her today. Patient remained calm and states that she is going to sleep more, Nurse will continue to monitor.

## 2020-01-23 NOTE — ED Notes (Signed)
Pt given supplies to shower. Pt is able to shower independently.  
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
## 2020-01-23 NOTE — ED Notes (Signed)
Pt given breakfast tray

## 2020-01-23 NOTE — ED Notes (Signed)
Hourly rounding reveals patient awake in room. No complaints, stable, in no acute distress. Q15 minute rounds and monitoring via Rover and Officer to continue.  

## 2020-01-23 NOTE — ED Provider Notes (Signed)
Emergency Medicine Observation Re-evaluation Note  Katherine Burgess is a 39 y.o. female, seen on rounds today.  Pt initially presented to the ED for complaints of Delusional Currently, the patient is calm, resting.  Physical Exam  BP 126/73   Pulse 79   Temp 99.1 F (37.3 C)   Resp 17   Ht 5' (1.524 m)   Wt 68.5 kg   LMP 01/01/2020   SpO2 98%   BMI 29.49 kg/m  Physical Exam   Gen: Calm, in NAD, resting in psych scrubs HEENT: NCAT CV: Well perfused Resp: Normal work of breathing, no apparent resp distress Neuro: Non-focal, no apparent deficits, at mental baseline Psych: Calm, resting  ED Course / MDM  EKG:    I have reviewed the labs performed to date as well as medications administered while in observation.  Recent changes in the last 24 hours include none, given ativan/haldol PO overnight. Plan  Current plan is for psych disposition, reassessment after meds . Patient is under full IVC at this time.   Shaune Pollack, MD 01/23/20 [phone]

## 2020-01-23 NOTE — ED Notes (Signed)
Pt given lunch tray.

## 2020-01-23 NOTE — Plan of Care (Signed)
  Problem: Education: Goal: Knowledge of [facility] Education information/materials will improve Outcome: Not Progressing Goal: Emotional status will improve Outcome: Not Progressing Goal: Mental status will improve Outcome: Not Progressing Goal: Verbalization of understanding the information provided will improve Outcome: Not Progressing   Problem: Activity: Goal: Interest or engagement in activities will improve Outcome: Not Progressing Goal: Sleeping patterns will improve Outcome: Not Progressing   

## 2020-01-23 NOTE — ED Notes (Signed)
IVC, pend BMU admit 

## 2020-01-24 DIAGNOSIS — F1110 Opioid abuse, uncomplicated: Secondary | ICD-10-CM

## 2020-01-24 LAB — LIPID PANEL
Cholesterol: 161 mg/dL (ref 0–200)
HDL: 30 mg/dL — ABNORMAL LOW (ref >40)
LDL Cholesterol: 110 mg/dL — ABNORMAL HIGH (ref 0–99)
Total CHOL/HDL Ratio: 5.4 RATIO
Triglycerides: 107 mg/dL (ref <150)
VLDL: 21 mg/dL (ref 0–40)

## 2020-01-24 MED ORDER — BUPRENORPHINE HCL-NALOXONE HCL 8-2 MG SL SUBL
1.0000 | SUBLINGUAL_TABLET | Freq: Every day | SUBLINGUAL | Status: AC
  Administered 2020-01-24: 1 via SUBLINGUAL
  Filled 2020-01-24: qty 1

## 2020-01-24 MED ORDER — CITALOPRAM HYDROBROMIDE 20 MG PO TABS
20.0000 mg | ORAL_TABLET | Freq: Every day | ORAL | Status: DC
  Administered 2020-01-24 – 2020-01-30 (×7): 20 mg via ORAL
  Filled 2020-01-24 (×7): qty 1

## 2020-01-24 MED ORDER — BUPRENORPHINE HCL-NALOXONE HCL 2-0.5 MG SL SUBL
2.0000 | SUBLINGUAL_TABLET | Freq: Every day | SUBLINGUAL | Status: AC
  Administered 2020-01-25 – 2020-01-26 (×2): 2 via SUBLINGUAL
  Filled 2020-01-24: qty 1
  Filled 2020-01-24: qty 2
  Filled 2020-01-24: qty 1

## 2020-01-24 MED ORDER — ALBUTEROL SULFATE HFA 108 (90 BASE) MCG/ACT IN AERS
2.0000 | INHALATION_SPRAY | Freq: Four times a day (QID) | RESPIRATORY_TRACT | Status: DC | PRN
  Administered 2020-01-24 – 2020-01-31 (×4): 2 via RESPIRATORY_TRACT
  Filled 2020-01-24: qty 6.7

## 2020-01-24 MED ORDER — NICOTINE 21 MG/24HR TD PT24
21.0000 mg | MEDICATED_PATCH | Freq: Every day | TRANSDERMAL | Status: DC
  Administered 2020-01-24 – 2020-02-01 (×9): 21 mg via TRANSDERMAL
  Filled 2020-01-24 (×9): qty 1

## 2020-01-24 MED ORDER — ARIPIPRAZOLE 5 MG PO TABS
5.0000 mg | ORAL_TABLET | Freq: Every day | ORAL | Status: DC
  Administered 2020-01-24 – 2020-01-25 (×2): 5 mg via ORAL
  Filled 2020-01-24 (×2): qty 1

## 2020-01-24 NOTE — Plan of Care (Signed)
  Problem: Education: Goal: Knowledge of [facility] Education information/materials will improve 01/24/2020 0645 by Billy Coast, RN Outcome: Progressing 01/23/2020 2240 by Billy Coast, RN Outcome: Not Progressing Goal: Emotional status will improve 01/24/2020 0645 by Billy Coast, RN Outcome: Progressing 01/23/2020 2240 by Billy Coast, RN Outcome: Not Progressing Goal: Mental status will improve 01/24/2020 0645 by Billy Coast, RN Outcome: Progressing 01/23/2020 2240 by Billy Coast, RN Outcome: Not Progressing Goal: Verbalization of understanding the information provided will improve 01/24/2020 0645 by Billy Coast, RN Outcome: Progressing 01/23/2020 2240 by Billy Coast, RN Outcome: Not Progressing
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
## 2020-01-24 NOTE — [facility] Suicide Risk Assessment (Signed)
[facility] INPATIENT:  Family/Significant Other Suicide Prevention Education  Suicide Prevention Education:  Patient Refusal for Family/Significant Other Suicide Prevention Education: The patient Katherine Burgess has refused to provide written consent for family/significant other to be provided Family/Significant Other Suicide Prevention Education during admission and/or prior to discharge.  Physician notified.  Ambri Miltner T Jeevan Kalla 01/24/2020, 11:34 AM
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
## 2020-01-24 NOTE — Progress Notes (Signed)
Recreation Therapy Notes    Date: 01/24/2020  Time: 9:30 am   Location: Craft room     Behavioral response: N/A   Intervention Topic: Self-esteem    Discussion/Intervention: Patient did not attend group.   Clinical Observations/Feedback:  Patient did not attend group.   Dawnyel Leven LRT/CTRS        Lyncoln Maskell 01/24/2020 12:14 PM
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
## 2020-01-24 NOTE — BHH Group Notes (Signed)
Feelings Around Relapse 01/24/2020 9:30AM/1PM  Type of Therapy and Topic:  Group Therapy:  Feelings around Relapse and Recovery  Participation Level:  Active   Description of Group:    Patients in this group will discuss emotions they experience before and after a relapse. They will process how experiencing these feelings, or avoidance of experiencing them, relates to having a relapse. Facilitator will guide patients to explore emotions they have related to recovery. Patients will be encouraged to process which emotions are more powerful. They will be guided to discuss the emotional reaction significant others in their lives may have to patients' relapse or recovery. Patients will be assisted in exploring ways to respond to the emotions of others without this contributing to a relapse.  Therapeutic Goals: 1. Patient will identify two or more emotions that lead to a relapse for them 2. Patient will identify two emotions that result when they relapse 3. Patient will identify two emotions related to recovery 4. Patient will demonstrate ability to communicate their needs through discussion and/or role plays   Summary of Patient Progress: Actively and appropriately engaged in the group. Patient was able to provide support and validation to other group members. Patient demonstrated understanding of subject matter and discussed with group members people, places and things that have contributed to her relapsing. Pt open to feedback from group members and respected boundaries during session.     Therapeutic Modalities:   Cognitive Behavioral Therapy Solution-Focused Therapy Assertiveness Training Relapse Prevention Therapy   Suzan Slick, LCSW 01/24/2020 2:42 PM

## 2020-01-24 NOTE — Plan of Care (Signed)
D- Patient alert and oriented. Patient presented in a preoccupied, but pleasant mood on assessment stating that she slept "well" last night and had complaints of a sore throat. Patient is fixated on something being stuck in her throat and reporting that she has been throwing up trying to get whatever is stuck in her throat out. Patient rated her sore throat pain a "10/10", in which she did request Zofran and a throat lozenge from this Clinical research associate. Patient also endorsed both depression and anxiety, reporting that "whatever's in my throat" is making her anxious. Patient did not state to this Clinical research associate why she is depressed. Patient denies SI, HI, AVH at this time. Patient's goal for today is "getting out of the hospital to go home as soon as possible", in which she will do "everything that I can", in order to accomplish her goal.  A- Scheduled medications administered to patient, per MD orders. Support and encouragement provided.  Routine safety checks conducted every 15 minutes.  Patient informed to notify staff with problems or concerns.  R- No adverse drug reactions noted. Patient contracts for safety at this time. Patient compliant with medications and treatment plan. Patient receptive, calm, and cooperative. Patient interacts well with others on the unit.  Patient remains safe at this time.  Problem: Education: Goal: Knowledge of [place] [facility] Education information/materials will improve Outcome: Not Progressing Goal: Emotional status will improve Outcome: Not Progressing Goal: Mental status will improve Outcome: Not Progressing Goal: Verbalization of understanding the information provided will improve Outcome: Not Progressing   Problem: Activity: Goal: Interest or engagement in activities will improve Outcome: Not Progressing Goal: Sleeping patterns will improve Outcome: Not Progressing   Problem: Coping: Goal: Ability to verbalize frustrations and anger appropriately will improve Outcome:  Not Progressing Goal: Ability to demonstrate self-control will improve Outcome: Not Progressing   Problem: Health Behavior/Discharge Planning: Goal: Identification of resources available to assist in meeting health care needs will improve Outcome: Not Progressing Goal: Compliance with treatment plan for underlying cause of condition will improve Outcome: Not Progressing   Problem: Physical Regulation: Goal: Ability to maintain clinical measurements within normal limits will improve Outcome: Not Progressing   Problem: Safety: Goal: Periods of time without injury will increase Outcome: Not Progressing   Problem: Activity: Goal: Will verbalize the importance of balancing activity with adequate rest periods Outcome: Not Progressing   Problem: Education: Goal: Will be free of psychotic symptoms Outcome: Not Progressing Goal: Knowledge of the prescribed therapeutic regimen will improve Outcome: Not Progressing   Problem: Coping: Goal: Coping ability will improve Outcome: Not Progressing Goal: Will verbalize feelings Outcome: Not Progressing   Problem: Health Behavior/Discharge Planning: Goal: Compliance with prescribed medication regimen will improve Outcome: Not Progressing   Problem: Nutritional: Goal: Ability to achieve adequate nutritional intake will improve Outcome: Not Progressing   Problem: Role Relationship: Goal: Ability to communicate needs accurately will improve Outcome: Not Progressing Goal: Ability to interact with others will improve Outcome: Not Progressing   Problem: Safety: Goal: Ability to redirect hostility and anger into socially appropriate behaviors will improve Outcome: Not Progressing Goal: Ability to remain free from injury will improve Outcome: Not Progressing   Problem: Self-Care: Goal: Ability to participate in self-care as condition permits will improve Outcome: Not Progressing   Problem: Self-Concept: Goal: Will verbalize positive  feelings about self Outcome: Not Progressing

## 2020-01-24 NOTE — TOC Initial Note (Signed)
Transition of Care [facility]) - Initial/Assessment Note    Patient Details  Name: Katherine Burgess MRN: [id number] Date of Birth: 11-Nov-1980  Transition of Care [facility] CM/SW Contact:    Fishers Cellar, RN Phone Number: 01/24/2020, 8:36 AM  Clinical Narrative:                 [facility] consult received. Patient is [facility] inpatient and not followed by TOC. Will remove consult from list.         Patient Goals and CMS Choice        Expected Discharge Plan and Services                                                Prior Living Arrangements/Services                       Activities of Daily Living Home Assistive Devices/Equipment: None ADL Screening (condition at time of admission) Patient's cognitive ability adequate to safely complete daily activities?: Yes Is the patient deaf or have difficulty hearing?: No Does the patient have difficulty seeing, even when wearing glasses/contacts?: No Does the patient have difficulty concentrating, remembering, or making decisions?: No Patient able to express need for assistance with ADLs?: Yes Does the patient have difficulty dressing or bathing?: No Independently performs ADLs?: Yes (appropriate for developmental age) Does the patient have difficulty walking or climbing stairs?: No Weakness of Legs: None Weakness of Arms/Hands: None  Permission Sought/Granted                  Emotional Assessment              Admission diagnosis:  Depression, major, recurrent, severe with psychosis (HCC) [F33.3] Patient Active Problem List   Diagnosis Date Noted  . Depression, major, recurrent, severe with psychosis (HCC) 01/23/2020  . Severe recurrent major depression with psychotic features (HCC) 02/19/2017  . 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 Dyspareunia 03/30/2013  . PID (acute pelvic inflammatory disease) 03/30/2013   PCP:  Avon Gully, MD Pharmacy:   [facility] [phone] - [place], [place] - [address] AT [address] [place] [address] Phone: [phone] Fax: [phone]  [facility] #[id number] [place], [place] - [address] AT [facility] OF [address] [place] [address] Phone: [phone] Fax: [phone]     Social Determinants of Health (SDOH) Interventions Alcohol Brief Interventions/Follow-up: Brief Advice  Readmission Risk Interventions No flowsheet data found.

## 2020-01-24 NOTE — Tx Team (Addendum)
Interdisciplinary Treatment and Diagnostic Plan Update  01/24/2020 Time of Session: 9am Katherine Burgess MRN: [id number]  Principal Diagnosis: <principal problem not specified>  Secondary Diagnoses: Active Problems:   Depression, major, recurrent, severe with psychosis ([place])   Current Medications:  Current Facility-Administered Medications  Medication Dose Route Frequency Provider Last Rate Last Admin  . acetaminophen (TYLENOL) tablet 650 mg  650 mg Oral Q6H PRN Clapacs, John T, MD      . albuterol (VENTOLIN HFA) 108 (90 Base) MCG/ACT inhaler 2 puff  2 puff Inhalation Q6H PRN Clapacs, John T, MD      . alum & mag hydroxide-simeth (MAALOX/MYLANTA) 200-200-20 MG/5ML suspension 30 mL  30 mL Oral Q4H PRN Clapacs, John T, MD      . ARIPiprazole (ABILIFY) tablet 5 mg  5 mg Oral Daily Clapacs, Madie Reno, MD      . Derrill Memo ON 01/25/2020] buprenorphine-naloxone (SUBOXONE) 2-0.5 mg per SL tablet 2 tablet  2 tablet Sublingual Daily Clapacs, John T, MD      . buprenorphine-naloxone (SUBOXONE) 8-2 mg per SL tablet 1 tablet  1 tablet Sublingual Daily Clapacs, John T, MD      . citalopram (CELEXA) tablet 20 mg  20 mg Oral Daily Clapacs, John T, MD      . hydrOXYzine (ATARAX/VISTARIL) tablet 50 mg  50 mg Oral TID PRN Clapacs, Madie Reno, MD   50 mg at 01/24/20 0911  . magnesium hydroxide (MILK OF MAGNESIA) suspension 30 mL  30 mL Oral Daily PRN Clapacs, John T, MD      . menthol-cetylpyridinium (CEPACOL) lozenge 3 mg  1 lozenge Oral PRN Caroline Sauger, NP   3 mg at 01/24/20 0815  . methocarbamol (ROBAXIN) tablet 750 mg  750 mg Oral Q6H PRN Caroline Sauger, NP   750 mg at 01/23/20 2301  . nicotine (NICODERM CQ - dosed in mg/24 hours) patch 21 mg  21 mg Transdermal Daily Clapacs, John T, MD      . ondansetron (ZOFRAN-ODT) disintegrating tablet 4 mg  4 mg Oral Q6H PRN Caroline Sauger, NP   4 mg at 01/24/20 0814  . pneumococcal 23 valent vaccine (PNEUMOVAX-23) injection 0.5 mL  0.5 mL Intramuscular  Tomorrow-1000 Clapacs, John T, MD      . traZODone (DESYREL) tablet 100 mg  100 mg Oral QHS PRN Clapacs, Madie Reno, MD   100 mg at 01/23/20 2107   PTA Medications: Medications Prior to Admission  Medication Sig Dispense Refill Last Dose  . Acetaminophen (TYLENOL PO) Take by mouth as needed. (Patient not taking: Reported on 01/23/2020)     . diclofenac Sodium (VOLTAREN) 1 % GEL Apply 1 application topically 4 (four) times daily as needed for pain. (Patient not taking: Reported on 01/23/2020)     . elvitegravir-cobicistat-emtricitabine-tenofovir (GENVOYA) 150-150-200-10 MG TABS tablet Take 1 tablet by mouth daily with breakfast. (Patient not taking: Reported on 01/23/2020) 30 tablet 0   . gabapentin (NEURONTIN) 100 MG capsule Take 3 capsules (300 mg total) by mouth as directed. 100 MG BID x 1 WK; 200 MG BID X 1 WK; 300 MG BID THEREAFTER (Patient not taking: Reported on 01/09/2020) 168 capsule 3   . ketorolac (TORADOL) 10 MG tablet Take 1 tablet (10 mg total) by mouth every 8 (eight) hours. (Patient not taking: Reported on 01/23/2020) 15 tablet 0   . nortriptyline (PAMELOR) 10 MG capsule Take 1 capsule (10 mg total) by mouth at bedtime. (Patient not taking: Reported on 01/23/2020) 30 capsule 3  Patient Stressors:    Patient Strengths:    Treatment Modalities: Medication Management, Group therapy, Case management,  1 to 1 session with clinician, Psychoeducation, Recreational therapy.   Physician Treatment Plan for Primary Diagnosis: <principal problem not specified> Long Term Goal(s):     Short Term Goals:    Medication Management: Evaluate patient's response, side effects, and tolerance of medication regimen.  Therapeutic Interventions: 1 to 1 sessions, Unit Group sessions and Medication administration.  Evaluation of Outcomes: Not Met  Physician Treatment Plan for Secondary Diagnosis: Active Problems:   Depression, major, recurrent, severe with psychosis ([place])  Long Term Goal(s):      Short Term Goals:       Medication Management: Evaluate patient's response, side effects, and tolerance of medication regimen.  Therapeutic Interventions: 1 to 1 sessions, Unit Group sessions and Medication administration.  Evaluation of Outcomes: Not Met   RN Treatment Plan for Primary Diagnosis: <principal problem not specified> Long Term Goal(s): Knowledge of disease and therapeutic regimen to maintain health will improve  Short Term Goals: Ability to demonstrate self-control, Ability to participate in decision making will improve, Ability to identify and develop effective coping behaviors will improve and Compliance with prescribed medications will improve  Medication Management: RN will administer medications as ordered by provider, will assess and evaluate patient's response and provide education to patient for prescribed medication. RN will report any adverse and/or side effects to prescribing provider.  Therapeutic Interventions: 1 on 1 counseling sessions, Psychoeducation, Medication administration, Evaluate responses to treatment, Monitor vital signs and CBGs as ordered, Perform/monitor CIWA, COWS, AIMS and Fall Risk screenings as ordered, Perform wound care treatments as ordered.  Evaluation of Outcomes: Not Met   LCSW Treatment Plan for Primary Diagnosis: <principal problem not specified> Long Term Goal(s): Safe transition to appropriate next level of care at discharge, Engage patient in therapeutic group addressing interpersonal concerns.  Short Term Goals: Engage patient in aftercare planning with referrals and resources  Therapeutic Interventions: Assess for all discharge needs, 1 to 1 time with Social worker, Explore available resources and support systems, Assess for adequacy in community support network, Educate family and significant other(s) on suicide prevention, Complete Psychosocial Assessment, Interpersonal group therapy.  Evaluation of Outcomes: Not  Met   Progress in Treatment: Attending groups: No. Participating in groups: No. Taking medication as prescribed: Yes. Toleration medication: Yes. Family/Significant other contact made: Yes, individual(s) contacted:  pt declined Patient understands diagnosis: Yes. Discussing patient identified problems/goals with staff: Yes. Medical problems stabilized or resolved: No. Denies suicidal/homicidal ideation: Yes. Issues/concerns per patient self-inventory: No. Other: NA  New problem(s) identified: No, Describe:  None reported  New Short Term/Long Term Goal(s):Attend outpatient treatment, take medication as prescribed, develop and implement healthy coping methods  Patient Goals:  "Go home as soon as possible"  Discharge Plan or Barriers: Pt is homeless, living in her vehicle. Pt request referral for outpatient treatment  Reason for Continuation of Hospitalization: Delusions  Medication stabilization  Estimated Length of Stay:1-7 days  Recreational Therapy: Patient: N/A Patient Goal: Patient will engage in groups without prompting or encouragement from LRT x3 group sessions within 5 recreation therapy group sessions  Attendees: Patient:Katherine Burgess 01/24/2020 11:34 AM  Physician: Alethia Berthold 01/24/2020 11:34 AM  Nursing: Collier Bullock, [place] 01/24/2020 11:34 AM  RN Care Manager: 01/24/2020 11:34 AM  Social Worker: Anise Salvo 01/24/2020 11:34 AM  Recreational Therapist: Isaias Sakai Tedd Cottrill 01/24/2020 11:34 AM  Other:  01/24/2020 11:34 AM  Other:  01/24/2020 11:34  AM  Other: 01/24/2020 11:34 AM    Scribe for Treatment Team: Yvette Rack, LCSW 01/24/2020 11:34 AM

## 2020-01-25 ENCOUNTER — Ambulatory Visit: Payer: Medicaid Other | Admitting: Neurology

## 2020-01-25 DIAGNOSIS — F333 Major depressive disorder, recurrent, severe with psychotic symptoms: Secondary | ICD-10-CM

## 2020-01-25 MED ORDER — ARIPIPRAZOLE 10 MG PO TABS
10.0000 mg | ORAL_TABLET | Freq: Every day | ORAL | Status: DC
  Administered 2020-01-26: 10 mg via ORAL
  Filled 2020-01-25: qty 1

## 2020-01-25 MED ORDER — IBUPROFEN 600 MG PO TABS
600.0000 mg | ORAL_TABLET | Freq: Four times a day (QID) | ORAL | Status: DC | PRN
  Administered 2020-01-25 – 2020-02-01 (×7): 600 mg via ORAL
  Filled 2020-01-25 (×8): qty 1

## 2020-01-25 NOTE — Progress Notes (Signed)
Recreation Therapy Notes  Date: 01/25/2020  Time: 9:30 am  Location: Craft-room    Behavioral response: Appropriate  Intervention Topic: Self-care  Discussion/Intervention:  Group content today was focused on Self-Care. The group defined self-care and some positive ways they care for themselves. Individuals expressed ways and reasons why they neglected any self-care in the past. Patients described ways to improve self-care in the future. The group explained what could happen if they did not do any self-care activities at all. The group participated in the intervention "self-care assessment" where they had a chance to discover some of their weaknesses and strengths in self- care. Patient came up with a self-care plan to improve themselves in the future.  Clinical Observations/Feedback:  Patient came to group and defined self-care as taking a shower and changing clothes. Participant left group early and did not return.     Zola Runion LRT/CTRS           Jontay Maston 01/25/2020 1:29 PM

## 2020-01-25 NOTE — Progress Notes (Signed)
Recreation Therapy Notes  INPATIENT RECREATION THERAPY ASSESSMENT  Patient Details Name: THELMA LORENZETTI MRN: [id number] DOB: 05-10-1981 Today's Date: 01/25/2020       Information Obtained From: Patient  Able to Participate in Assessment/Interview: Yes  Patient Presentation: Responsive  Reason for Admission (Per Patient): Active Symptoms, Substance Abuse  Patient Stressors:    Coping Skills:   Substance Abuse, Prayer, Music  Leisure Interests (2+):  Music - Listen, Individual - Phone  Frequency of Recreation/Participation: Weekly  Awareness of Community Resources:     [employer]:     Current Use:    If no, Barriers?:    Expressed Interest in [employer] Information:    [place] of Residence:  [place]  Patient Main Form of Transportation: Set designer  Patient Strengths:  N/A  Patient Identified Areas of Improvement:  N/A  Patient Goal for Hospitalization:  To go home  Current SI (including self-harm):  No  Current HI:  No  Current AVH: No  Staff Intervention Plan: Group Attendance, Collaborate with Interdisciplinary Treatment Team  Consent to Intern Participation: N/A  Belky Mundo 01/25/2020, 3:18 PM

## 2020-01-25 NOTE — Progress Notes (Signed)
Patient is quiet and reserved. She was alert and oriented x4. Unable to assess whether she was responding to stimuli. She was clear and coherent when speaking but did not speak much during the encounter. She declined the need  to take the Zofran for her throat stating it was feeling much better. She received all of  her other prescribed meds and tolerated without incident.  She denied SI/HI/AVH depression and anxiety at this encounter. She remains safe on the unit with 15 minute safety checks and informed to contact staff with concerns.   Cleo Butler-Nicholson, LPN

## 2020-01-25 NOTE — Progress Notes (Addendum)
Patient pleasant this evening but stated to staff, "I think a couple of the patients want to fight me, I am going to stay in my room." Patient denies SI/HI but endorses hearing voices, stating she hears a hissing sound. Patient given education, support and encouragement. Patient compliant with medication administration per MD orders. Patient observed by staff interacting appropriate with staff and peers. Patient being monitored Q 15 minutes for safety per unit protocol. Patient remains safe on the unit.

## 2020-01-25 NOTE — Plan of Care (Signed)
  Problem: Education: Goal: Knowledge of [facility] Education information/materials will improve Outcome: Not Progressing Goal: Emotional status will improve Outcome: Not Progressing Goal: Mental status will improve Outcome: Not Progressing Goal: Verbalization of understanding the information provided will improve Outcome: Not Progressing   Problem: Activity: Goal: Interest or engagement in activities will improve Outcome: Not Progressing Goal: Sleeping patterns will improve Outcome: Not Progressing   Problem: Coping: Goal: Ability to verbalize frustrations and anger appropriately will improve Outcome: Not Progressing Goal: Ability to demonstrate self-control will improve Outcome: Not Progressing   Problem: Health Behavior/Discharge Planning: Goal: Identification of resources available to assist in meeting health care needs will improve Outcome: Not Progressing Goal: Compliance with treatment plan for underlying cause of condition will improve Outcome: Not Progressing   Problem: Physical Regulation: Goal: Ability to maintain clinical measurements within normal limits will improve Outcome: Not Progressing   Problem: Safety: Goal: Periods of time without injury will increase Outcome: Not Progressing   Problem: Activity: Goal: Will verbalize the importance of balancing activity with adequate rest periods Outcome: Not Progressing   Problem: Education: Goal: Will be free of psychotic symptoms Outcome: Not Progressing Goal: Knowledge of the prescribed therapeutic regimen will improve Outcome: Not Progressing   Problem: Coping: Goal: Coping ability will improve Outcome: Not Progressing Goal: Will verbalize feelings Outcome: Not Progressing   Problem: Health Behavior/Discharge Planning: Goal: Compliance with prescribed medication regimen will improve Outcome: Not Progressing   Problem: Nutritional: Goal: Ability to achieve adequate nutritional intake will  improve Outcome: Not Progressing   Problem: Role Relationship: Goal: Ability to communicate needs accurately will improve Outcome: Not Progressing Goal: Ability to interact with others will improve Outcome: Not Progressing   Problem: Safety: Goal: Ability to redirect hostility and anger into socially appropriate behaviors will improve Outcome: Not Progressing Goal: Ability to remain free from injury will improve Outcome: Not Progressing   Problem: Self-Care: Goal: Ability to participate in self-care as condition permits will improve Outcome: Not Progressing   Problem: Self-Concept: Goal: Will verbalize positive feelings about self Outcome: Not Progressing   

## 2020-01-25 NOTE — Plan of Care (Signed)
D: Pt alert and oriented x 4. Pt rates depression 9/10, hopelessness 7/10, and anxiety 10/10. Pt goal: "depression/anxiety." Pt reports energy level as low and concentration as being good. Pt reports sleep last night as being fair. Pt did receive medications for sleep and did find them helpful. Pt reports experiencing headache pain, prn meds given. Pt denies experiencing any SI/HI, or AVH at this time.   Pt appears as paranoid, this afternoon made a statement that the pt with dreads keeps staring at her and following and it's making her nervous.  Pt voices not having much of an appetite and that she is having difficulty eating solid foods however finds it easier to eat liquids and soft foods such as broth.  A: Scheduled medications administered to pt, per MD orders. Support and encouragement provided. Frequent verbal contact made. Routine safety checks conducted q15 minutes.   R: No adverse drug reactions noted. Pt verbally contracts for safety at this time. Pt complaint with medications and treatment plan. Pt interacts well with others on the unit. Pt remains safe at this time. Will continue to monitor.   Problem: Education: Goal: Knowledge of [facility] Education information/materials will improve Outcome: Not Progressing Goal: Emotional status will improve Outcome: Not Progressing Goal: Mental status will improve Outcome: Not Progressing Goal: Verbalization of understanding the information provided will improve Outcome: Not Progressing   Problem: Activity: Goal: Interest or engagement in activities will improve Outcome: Not Progressing Goal: Sleeping patterns will improve Outcome: Not Progressing   Problem: Coping: Goal: Ability to verbalize frustrations and anger appropriately will improve Outcome: Not Progressing Goal: Ability to demonstrate self-control will improve Outcome: Not Progressing   Problem: Health Behavior/Discharge Planning: Goal: Identification of  resources available to assist in meeting health care needs will improve Outcome: Not Progressing Goal: Compliance with treatment plan for underlying cause of condition will improve Outcome: Not Progressing   Problem: Physical Regulation: Goal: Ability to maintain clinical measurements within normal limits will improve Outcome: Not Progressing   Problem: Safety: Goal: Periods of time without injury will increase Outcome: Not Progressing   Problem: Activity: Goal: Will verbalize the importance of balancing activity with adequate rest periods Outcome: Not Progressing   Problem: Education: Goal: Will be free of psychotic symptoms Outcome: Not Progressing Goal: Knowledge of the prescribed therapeutic regimen will improve Outcome: Not Progressing   Problem: Coping: Goal: Coping ability will improve Outcome: Not Progressing Goal: Will verbalize feelings Outcome: Not Progressing   Problem: Health Behavior/Discharge Planning: Goal: Compliance with prescribed medication regimen will improve Outcome: Not Progressing   Problem: Nutritional: Goal: Ability to achieve adequate nutritional intake will improve Outcome: Not Progressing   Problem: Role Relationship: Goal: Ability to communicate needs accurately will improve Outcome: Not Progressing Goal: Ability to interact with others will improve Outcome: Not Progressing   Problem: Safety: Goal: Ability to redirect hostility and anger into socially appropriate behaviors will improve Outcome: Not Progressing Goal: Ability to remain free from injury will improve Outcome: Not Progressing   Problem: Self-Care: Goal: Ability to participate in self-care as condition permits will improve Outcome: Not Progressing   Problem: Self-Concept: Goal: Will verbalize positive feelings about self Outcome: Not Progressing

## 2020-01-25 NOTE — [facility] Group Notes (Signed)
[facility] Group Notes:  (Nursing/MHT/Case Management/Adjunct)  Date:  01/25/2020  Time:  10:22 AM  Type of Therapy:  COMMUNITY MEETING  Participation Level:  Active  Participation Quality:  Appropriate  Affect:  Appropriate  Cognitive:  Alert and Appropriate  Insight:  Appropriate  Engagement in Group:  Engaged  Modes of Intervention:  Discussion and Education  Summary of Progress/Problems:  Kerrie Pleasure 01/25/2020, 10:22 AM

## 2020-01-25 NOTE — Plan of Care (Signed)
Patient presents less delusional than when this writer had her last  Problem: Education: Goal: Emotional status will improve Outcome: Progressing Goal: Mental status will improve Outcome: Progressing
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
## 2020-01-26 MED ORDER — ARIPIPRAZOLE 5 MG PO TABS
15.0000 mg | ORAL_TABLET | Freq: Every day | ORAL | Status: DC
  Administered 2020-01-27: 15 mg via ORAL
  Filled 2020-01-26: qty 1

## 2020-01-26 MED ORDER — ENSURE ENLIVE PO LIQD
237.0000 mL | Freq: Two times a day (BID) | ORAL | Status: DC
  Administered 2020-01-26 – 2020-02-01 (×11): 237 mL via ORAL

## 2020-01-26 NOTE — Progress Notes (Signed)
Recreation Therapy Notes  Date: 01/26/2020  Time: 9:30 am   Location: Craft room     Behavioral response: N/A   Intervention Topic: Goals   Discussion/Intervention: Patient did not attend group.   Clinical Observations/Feedback:  Patient did not attend group.   Jlon Betker LRT/CTRS        Carlye Panameno 01/26/2020 11:20 AM

## 2020-01-26 NOTE — Progress Notes (Signed)
Patient pleasant this evening but stated to staff, "I think some of the patients want to get me." Patient given support and encouragement.Patient denies SI/HI but endorses hearing voices, stating she hears a hissing sound.Patient given education, support and encouragement. Patient compliant with medication administration per MD orders. Patient observed by staff interacting appropriate with staff and peers. Patient being monitored Q 15 minutes for safety per unit protocol. Patient remains safe on the unit. 

## 2020-01-26 NOTE — Plan of Care (Signed)
D: Pt alert and oriented x 4. Pt rates depression 8/10, hopelessness 8/10, and anxiety 10/10.Pt goal: "getting better to go home." Pt reports energy level as low and concentration as being poor. Pt reports sleep last night as being fair. Pt did receive medications for sleep and did find them helpful. Pt reports experiencing headache pain, prn meds given. Pt denies experiencing any SI/HI, or VH at this time. Pt endorses AH of hearing hissing in her head.   Pt is paranoid, thinks other pt's are going to get her or do something to her. Pt does not like to go into group setting r/t this paranoia. Pt also request soft food to eat this afternoon and evening stating she was unable to swallow the more solid foods.  A: Scheduled medications administered to pt, per MD orders. Support and encouragement provided. Frequent verbal contact made. Routine safety checks conducted q15 minutes.   R: No adverse drug reactions noted. Pt verbally contracts for safety at this time. Pt complaint with medications and treatment plan. Pt interacts well with others on the unit. Pt remains safe at this time. Will continue to monitor.   Problem: Education: Goal: Knowledge of [place] [facility] Education information/materials will improve Outcome: Not Progressing Goal: Emotional status will improve Outcome: Not Progressing Goal: Mental status will improve Outcome: Not Progressing Goal: Verbalization of understanding the information provided will improve Outcome: Not Progressing   Problem: Activity: Goal: Interest or engagement in activities will improve Outcome: Not Progressing Goal: Sleeping patterns will improve Outcome: Not Progressing   Problem: Coping: Goal: Ability to verbalize frustrations and anger appropriately will improve Outcome: Not Progressing Goal: Ability to demonstrate self-control will improve Outcome: Not Progressing   Problem: Health Behavior/Discharge Planning: Goal: Identification of resources  available to assist in meeting health care needs will improve Outcome: Not Progressing Goal: Compliance with treatment plan for underlying cause of condition will improve Outcome: Not Progressing   Problem: Physical Regulation: Goal: Ability to maintain clinical measurements within normal limits will improve Outcome: Not Progressing   Problem: Safety: Goal: Periods of time without injury will increase Outcome: Not Progressing   Problem: Activity: Goal: Will verbalize the importance of balancing activity with adequate rest periods Outcome: Not Progressing   Problem: Education: Goal: Will be free of psychotic symptoms Outcome: Not Progressing Goal: Knowledge of the prescribed therapeutic regimen will improve Outcome: Not Progressing   Problem: Coping: Goal: Coping ability will improve Outcome: Not Progressing Goal: Will verbalize feelings Outcome: Not Progressing   Problem: Health Behavior/Discharge Planning: Goal: Compliance with prescribed medication regimen will improve Outcome: Not Progressing   Problem: Nutritional: Goal: Ability to achieve adequate nutritional intake will improve Outcome: Not Progressing   Problem: Role Relationship: Goal: Ability to communicate needs accurately will improve Outcome: Not Progressing Goal: Ability to interact with others will improve Outcome: Not Progressing   Problem: Safety: Goal: Ability to redirect hostility and anger into socially appropriate behaviors will improve Outcome: Not Progressing Goal: Ability to remain free from injury will improve Outcome: Not Progressing   Problem: Self-Care: Goal: Ability to participate in self-care as condition permits will improve Outcome: Not Progressing   Problem: Self-Concept: Goal: Will verbalize positive feelings about self Outcome: Not Progressing
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
## 2020-01-26 NOTE — Plan of Care (Signed)
Patient presents with delusions about peers being out to get her.   Problem: Education: Goal: Emotional status will improve Outcome: Not Progressing Goal: Mental status will improve Outcome: Not Progressing

## 2020-01-27 MED ORDER — CLONAZEPAM 0.5 MG PO TABS
0.5000 mg | ORAL_TABLET | Freq: Three times a day (TID) | ORAL | Status: DC
  Administered 2020-01-27 – 2020-02-01 (×16): 0.5 mg via ORAL
  Filled 2020-01-27 (×17): qty 1

## 2020-01-27 MED ORDER — ARIPIPRAZOLE 10 MG PO TABS
20.0000 mg | ORAL_TABLET | Freq: Every day | ORAL | Status: DC
  Administered 2020-01-28 – 2020-01-30 (×3): 20 mg via ORAL
  Filled 2020-01-27 (×3): qty 2
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
## 2020-01-27 NOTE — Progress Notes (Signed)
Recreation Therapy Notes   Date: 01/27/2020  Time: 9:30 am   Location: Craft room     Behavioral response: N/A   Intervention Topic: Relaxation    Discussion/Intervention: Patient did not attend group.   Clinical Observations/Feedback:  Patient did not attend group.   (Patient was given Public house manager on [employer] and the precautions and procedures put in place to ensure the safety of the patient.)    Brianne Maina LRT/CTRS        Dejanira Pamintuan 01/27/2020 11:48 AM

## 2020-01-27 NOTE — Plan of Care (Signed)
  Problem: Group Participation Goal: STG - Patient will engage in groups without prompting or encouragement from LRT x3 group sessions within 5 recreation therapy group sessions Description: STG - Patient will engage in groups without prompting or encouragement from LRT x3 group sessions within 5 recreation therapy group sessions Outcome: Not Progressing   

## 2020-01-27 NOTE — Plan of Care (Signed)
D- Patient alert and oriented. Patient presented in an anxious, but pleasant mood on assessment stating that she "ok" last night, "like a rock". Patient had complaints of a headache, rating her pain an "8/10", in which she did request pain medication from this Clinical research associate. Patient reported that her depression is "not like it was when I came in", and she endorsed "a little bit" of anxiety. Patient has been preoccupied with the paranoia that other patients are out to get her, or only waiting for her to come out of her room. Patient denied SI, HI, AVH at this time. Patient's goal for today is "getting better to go home", in which she will do "everything possible", in order to achieve her goal.  A- Scheduled medications administered to patient, per MD orders. Support and encouragement provided.  Routine safety checks conducted every 15 minutes.  Patient informed to notify staff with problems or concerns.  R- No adverse drug reactions noted. Patient contracts for safety at this time. Patient compliant with medications and treatment plan. Patient receptive, calm, and cooperative. Patient interacts well with others on the unit.  Patient remains safe at this time.  Problem: Education: Goal: Knowledge of [facility] Education information/materials will improve Outcome: Not Progressing Goal: Emotional status will improve Outcome: Not Progressing Goal: Mental status will improve Outcome: Not Progressing Goal: Verbalization of understanding the information provided will improve Outcome: Not Progressing   Problem: Activity: Goal: Interest or engagement in activities will improve Outcome: Not Progressing Goal: Sleeping patterns will improve Outcome: Not Progressing   Problem: Coping: Goal: Ability to verbalize frustrations and anger appropriately will improve Outcome: Not Progressing Goal: Ability to demonstrate self-control will improve Outcome: Not Progressing   Problem: Health Behavior/Discharge  Planning: Goal: Identification of resources available to assist in meeting health care needs will improve Outcome: Not Progressing Goal: Compliance with treatment plan for underlying cause of condition will improve Outcome: Not Progressing   Problem: Physical Regulation: Goal: Ability to maintain clinical measurements within normal limits will improve Outcome: Not Progressing   Problem: Safety: Goal: Periods of time without injury will increase Outcome: Not Progressing   Problem: Activity: Goal: Will verbalize the importance of balancing activity with adequate rest periods Outcome: Not Progressing   Problem: Education: Goal: Will be free of psychotic symptoms Outcome: Not Progressing Goal: Knowledge of the prescribed therapeutic regimen will improve Outcome: Not Progressing   Problem: Coping: Goal: Coping ability will improve Outcome: Not Progressing Goal: Will verbalize feelings Outcome: Not Progressing   Problem: Health Behavior/Discharge Planning: Goal: Compliance with prescribed medication regimen will improve Outcome: Not Progressing   Problem: Nutritional: Goal: Ability to achieve adequate nutritional intake will improve Outcome: Not Progressing   Problem: Role Relationship: Goal: Ability to communicate needs accurately will improve Outcome: Not Progressing Goal: Ability to interact with others will improve Outcome: Not Progressing   Problem: Safety: Goal: Ability to redirect hostility and anger into socially appropriate behaviors will improve Outcome: Not Progressing Goal: Ability to remain free from injury will improve Outcome: Not Progressing   Problem: Self-Care: Goal: Ability to participate in self-care as condition permits will improve Outcome: Not Progressing   Problem: Self-Concept: Goal: Will verbalize positive feelings about self Outcome: Not Progressing
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
## 2020-01-28 NOTE — [facility] Group Notes (Signed)
LCSW Group Therapy Note  01/28/2020   1:03 PM- 1:42 PM  Type of Therapy and Topic:  Group Therapy: Anger Cues and Responses  Participation Level:  Active   Description of Group:   In this group, patients learned how to recognize the physical, cognitive, emotional, and behavioral responses they have to anger-provoking situations.  They identified a recent time they became angry and how they reacted.  They analyzed how their reaction was possibly beneficial and how it was possibly unhelpful.  The group discussed a variety of healthier coping skills that could help with such a situation in the future.  Focus was placed on how helpful it is to recognize the underlying emotions to our anger, because working on those can lead to a more permanent solution as well as our ability to focus on the important rather than the urgent.  Therapeutic Goals: 1. Patients will remember their last incident of anger and how they felt emotionally and physically, what their thoughts were at the time, and how they behaved. 2. Patients will identify how their behavior at that time worked for them, as well as how it worked against them. 3. Patients will explore possible new behaviors to use in future anger situations. 4. Patients will learn that anger itself is normal and cannot be eliminated, and that healthier reactions can assist with resolving conflict rather than worsening situations.  Summary of Patient Progress:  The patient shared that she does not have an anger problem and that she is in here because of substance abuse. Patient stated that she walks away from certain situations because she does not want to deal with those situations and become angry. Patient shared reading a book as a positive coping skill and recognizing her triggers to walk away from certain situations.  Therapeutic Modalities:   Cognitive Behavioral Therapy    Susa Simmonds, LCSWA 01/28/2020  2:11 PM

## 2020-01-28 NOTE — Plan of Care (Signed)
Patient presents with delusions of thinking patients are out to get her.   Problem: Education: Goal: Emotional status will improve Outcome: Not Progressing Goal: Mental status will improve Outcome: Not Progressing

## 2020-01-28 NOTE — Plan of Care (Signed)
D: Pt alert and oriented x 4. Pt rates depression 2/10, hopelessness 2/10, and anxiety 4/10.Pt goal: "Getting myself together so I can go home." Pt reports energy level as normal and concentration as being good. Pt reports sleep last night as being good. Pt did not receive medications for sleep. Pt reports experiencing 6/10 leg and head pain, prn med given. Pt denies experiencing any SI/HI, or AH at this time. Pt endorses AH, states she hears crickets in there right ear.  A: Scheduled medications administered to pt, per MD orders. Support and encouragement provided. Frequent verbal contact made. Routine safety checks conducted q15 minutes.   R: No adverse drug reactions noted. Pt verbally contracts for safety at this time. Pt complaint with medications and treatment plan. Pt interacts well with others on the unit. Pt remains safe at this time. Will continue to monitor.   Problem: Education: Goal: Knowledge of [facility] Education information/materials will improve Outcome: Not Progressing Goal: Emotional status will improve Outcome: Not Progressing Goal: Mental status will improve Outcome: Not Progressing Goal: Verbalization of understanding the information provided will improve Outcome: Not Progressing   Problem: Activity: Goal: Interest or engagement in activities will improve Outcome: Not Progressing Goal: Sleeping patterns will improve Outcome: Not Progressing   Problem: Coping: Goal: Ability to verbalize frustrations and anger appropriately will improve Outcome: Not Progressing Goal: Ability to demonstrate self-control will improve Outcome: Not Progressing   Problem: Health Behavior/Discharge Planning: Goal: Identification of resources available to assist in meeting health care needs will improve Outcome: Not Progressing Goal: Compliance with treatment plan for underlying cause of condition will improve Outcome: Not Progressing   Problem: Physical Regulation: Goal:  Ability to maintain clinical measurements within normal limits will improve Outcome: Not Progressing   Problem: Safety: Goal: Periods of time without injury will increase Outcome: Not Progressing   Problem: Activity: Goal: Will verbalize the importance of balancing activity with adequate rest periods Outcome: Not Progressing   Problem: Education: Goal: Will be free of psychotic symptoms Outcome: Not Progressing Goal: Knowledge of the prescribed therapeutic regimen will improve Outcome: Not Progressing   Problem: Coping: Goal: Coping ability will improve Outcome: Not Progressing Goal: Will verbalize feelings Outcome: Not Progressing   Problem: Health Behavior/Discharge Planning: Goal: Compliance with prescribed medication regimen will improve Outcome: Not Progressing   Problem: Nutritional: Goal: Ability to achieve adequate nutritional intake will improve Outcome: Not Progressing   Problem: Role Relationship: Goal: Ability to communicate needs accurately will improve Outcome: Not Progressing Goal: Ability to interact with others will improve Outcome: Not Progressing   Problem: Safety: Goal: Ability to redirect hostility and anger into socially appropriate behaviors will improve Outcome: Not Progressing Goal: Ability to remain free from injury will improve Outcome: Not Progressing   Problem: Self-Care: Goal: Ability to participate in self-care as condition permits will improve Outcome: Not Progressing   Problem: Self-Concept: Goal: Will verbalize positive feelings about self Outcome: Not Progressing

## 2020-01-28 NOTE — Progress Notes (Signed)
Patient pleasant this evening but stated to staff, "I think some of the patients want to get me." Patient given support and encouragement.Patient denies SI/HI but endorses hearing voices, stating she hears a hissing sound.Patient given education, support and encouragement. Patient compliant with medication administration per MD orders. Patient observed by staff interacting appropriate with staff and peers. Patient being monitored Q 15 minutes for safety per unit protocol. Patient remains safe on the unit.

## 2020-01-29 NOTE — Plan of Care (Signed)
  Problem: Education: Goal: Knowledge of [facility] Education information/materials will improve Outcome: Progressing Goal: Emotional status will improve Outcome: Progressing Goal: Mental status will improve Outcome: Progressing Goal: Verbalization of understanding the information provided will improve Outcome: Progressing   Problem: Activity: Goal: Interest or engagement in activities will improve Outcome: Progressing Goal: Sleeping patterns will improve Outcome: Progressing   
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
## 2020-01-29 NOTE — [facility] Group Notes (Addendum)
[facility] LCSW Group Therapy Note  Date/Time:  01/29/2020 1:54 PM- 2:44 PM  Type of Therapy and Topic:  Group Therapy:  Healthy and Unhealthy Supports  Participation Level:  Minimal   Description of Group:  Patients in this group were introduced to the idea of adding a variety of healthy supports to address the various needs in their lives.Patients discussed what additional healthy supports could be helpful in their recovery and wellness after discharge in order to prevent future hospitalizations.   An emphasis was placed on using counselor, doctor, therapy groups, 12-step groups, and problem-specific support groups to expand supports.  They also worked as a group on developing a specific plan for several patients to deal with unhealthy supports through boundary-setting, psychoeducation with loved ones, and even termination of relationships.   Therapeutic Goals:   1)  discuss importance of adding supports to stay well once out of the hospital  2)  compare healthy versus unhealthy supports and identify some examples of each  3)  generate ideas and descriptions of healthy supports that can be added  4)  offer mutual support about how to address unhealthy supports  5)  encourage active participation in and adherence to discharge plan    Summary of Patient Progress:  Patient checked into group today stating she had chest pains and a headache. Patient asked what happens if you do not have any supports. There was a group discussion about how a support does not have to be a family member. A support could be your counselor, therapist, or being a part of a support group or going to church. Patient asked to leave and go back to her room.   Therapeutic Modalities:   Motivational Interviewing Brief Solution-Focused Therapy  Susa Simmonds, Theresia Majors 01/29/2020  3:15 PM

## 2020-01-29 NOTE — Progress Notes (Signed)
Patient has been calm and cooperative. Denies SI. Still somatic. Asking for medication to be ordered for hiccups. Slept all night.

## 2020-01-29 NOTE — Progress Notes (Signed)
MD was notified about the pill event.

## 2020-01-29 NOTE — Plan of Care (Signed)
D- Patient alert and oriented. Patient presented in a pleasant mood on assessment stating that she slept good last night, however, she reported having "nightmares about snakes and my younger sister". Patient endorsed having a headache, rating her pain level a "6/10", in which she requested medication from this Clinical research associate. Patient also reported depression/anxiety on her self-inventory, rating them a "2/10" and "3/10". Patient stated that her depression is "not as bad as it was", and her anxiety "comes and goes". Patient denied SI, HI, AVH at this time. Patient's goal for today is "being able to go home", in which she will do "everything I possibly can", in order to achieve her goal.  A- Scheduled medications administered to patient, per MD orders. Support and encouragement provided.  Routine safety checks conducted every 15 minutes.  Patient informed to notify staff with problems or concerns.  R- No adverse drug reactions noted. Patient contracts for safety at this time. Patient compliant with medications and treatment plan. Patient receptive, calm, and cooperative. Patient interacts well with others on the unit.  Patient remains safe at this time.  Problem: Education: Goal: Knowledge of [facility] Education information/materials will improve Outcome: Progressing Goal: Emotional status will improve Outcome: Progressing Goal: Mental status will improve Outcome: Progressing Goal: Verbalization of understanding the information provided will improve Outcome: Progressing   Problem: Activity: Goal: Interest or engagement in activities will improve Outcome: Progressing Goal: Sleeping patterns will improve Outcome: Progressing   Problem: Coping: Goal: Ability to verbalize frustrations and anger appropriately will improve Outcome: Progressing Goal: Ability to demonstrate self-control will improve Outcome: Progressing   Problem: Health Behavior/Discharge Planning: Goal: Identification of  resources available to assist in meeting health care needs will improve Outcome: Progressing Goal: Compliance with treatment plan for underlying cause of condition will improve Outcome: Progressing   Problem: Physical Regulation: Goal: Ability to maintain clinical measurements within normal limits will improve Outcome: Progressing   Problem: Safety: Goal: Periods of time without injury will increase Outcome: Progressing   Problem: Activity: Goal: Will verbalize the importance of balancing activity with adequate rest periods Outcome: Progressing   Problem: Education: Goal: Will be free of psychotic symptoms Outcome: Progressing Goal: Knowledge of the prescribed therapeutic regimen will improve Outcome: Progressing   Problem: Coping: Goal: Coping ability will improve Outcome: Progressing Goal: Will verbalize feelings Outcome: Progressing   Problem: Health Behavior/Discharge Planning: Goal: Compliance with prescribed medication regimen will improve Outcome: Progressing   Problem: Nutritional: Goal: Ability to achieve adequate nutritional intake will improve Outcome: Progressing   Problem: Role Relationship: Goal: Ability to communicate needs accurately will improve Outcome: Progressing Goal: Ability to interact with others will improve Outcome: Progressing   Problem: Safety: Goal: Ability to redirect hostility and anger into socially appropriate behaviors will improve Outcome: Progressing Goal: Ability to remain free from injury will improve Outcome: Progressing   Problem: Self-Care: Goal: Ability to participate in self-care as condition permits will improve Outcome: Progressing   Problem: Self-Concept: Goal: Will verbalize positive feelings about self Outcome: Progressing

## 2020-01-29 NOTE — Tx Team (Signed)
Interdisciplinary Treatment and Diagnostic Plan Update  01/29/2020 Time of Session: 1055 SHIRLEYMAE HAUTH MRN: [id number]  Principal Diagnosis: Depression, major, recurrent, severe with psychosis (HCC)  Secondary Diagnoses: Principal Problem:   Depression, major, recurrent, severe with psychosis (HCC) Active Problems:   Opiate abuse, continuous (HCC)   Current Medications:  Current Facility-Administered Medications  Medication Dose Route Frequency Provider Last Rate Last Admin   acetaminophen (TYLENOL) tablet 650 mg  650 mg Oral Q6H PRN Clapacs, John T, MD   650 mg at 01/29/20 1121   albuterol (VENTOLIN HFA) 108 (90 Base) MCG/ACT inhaler 2 puff  2 puff Inhalation Q6H PRN Clapacs, Jackquline Denmark, MD   2 puff at 01/26/20 0757   alum & mag hydroxide-simeth (MAALOX/MYLANTA) 200-200-20 MG/5ML suspension 30 mL  30 mL Oral Q4H PRN Clapacs, John T, MD       ARIPiprazole (ABILIFY) tablet 20 mg  20 mg Oral Daily Clapacs, John T, MD   20 mg at 01/29/20 0749   citalopram (CELEXA) tablet 20 mg  20 mg Oral Daily Clapacs, John T, MD   20 mg at 01/29/20 0749   clonazePAM (KLONOPIN) tablet 0.5 mg  0.5 mg Oral TID Clapacs, John T, MD   0.5 mg at 01/29/20 1120   feeding supplement (ENSURE ENLIVE) (ENSURE ENLIVE) liquid 237 mL  237 mL Oral BID BM Clapacs, John T, MD   237 mL at 01/29/20 1418   hydrOXYzine (ATARAX/VISTARIL) tablet 50 mg  50 mg Oral TID PRN Clapacs, Jackquline Denmark, MD   50 mg at 01/29/20 1418   ibuprofen (ADVIL) tablet 600 mg  600 mg Oral Q6H PRN Clapacs, John T, MD   600 mg at 01/29/20 0753   magnesium hydroxide (MILK OF MAGNESIA) suspension 30 mL  30 mL Oral Daily PRN Clapacs, John T, MD   30 mL at 01/28/20 0755   menthol-cetylpyridinium (CEPACOL) lozenge 3 mg  1 lozenge Oral PRN Gillermo Murdoch, NP   3 mg at 01/24/20 1739   methocarbamol (ROBAXIN) tablet 750 mg  750 mg Oral Q6H PRN Gillermo Murdoch, NP   750 mg at 01/24/20 2138   nicotine (NICODERM CQ - dosed in mg/24 hours) patch 21 mg   21 mg Transdermal Daily Clapacs, John T, MD   21 mg at 01/29/20 0750   ondansetron (ZOFRAN-ODT) disintegrating tablet 4 mg  4 mg Oral Q6H PRN Gillermo Murdoch, NP   4 mg at 01/24/20 0814   traZODone (DESYREL) tablet 100 mg  100 mg Oral QHS PRN Clapacs, Jackquline Denmark, MD   100 mg at 01/28/20 2142   PTA Medications: Medications Prior to Admission  Medication Sig Dispense Refill Last Dose   Acetaminophen (TYLENOL PO) Take by mouth as needed. (Patient not taking: Reported on 01/23/2020)      diclofenac Sodium (VOLTAREN) 1 % GEL Apply 1 application topically 4 (four) times daily as needed for pain. (Patient not taking: Reported on 01/23/2020)      elvitegravir-cobicistat-emtricitabine-tenofovir (GENVOYA) 150-150-200-10 MG TABS tablet Take 1 tablet by mouth daily with breakfast. (Patient not taking: Reported on 01/23/2020) 30 tablet 0    gabapentin (NEURONTIN) 100 MG capsule Take 3 capsules (300 mg total) by mouth as directed. 100 MG BID x 1 WK; 200 MG BID X 1 WK; 300 MG BID THEREAFTER (Patient not taking: Reported on 01/09/2020) 168 capsule 3    ketorolac (TORADOL) 10 MG tablet Take 1 tablet (10 mg total) by mouth every 8 (eight) hours. (Patient not taking: Reported on 01/23/2020) 15 tablet  0    nortriptyline (PAMELOR) 10 MG capsule Take 1 capsule (10 mg total) by mouth at bedtime. (Patient not taking: Reported on 01/23/2020) 30 capsule 3     Patient Stressors:    Patient Strengths:    Treatment Modalities: Medication Management, Group therapy, Case management,  1 to 1 session with clinician, Psychoeducation, Recreational therapy.   Physician Treatment Plan for Primary Diagnosis: Depression, major, recurrent, severe with psychosis (HCC) Long Term Goal(s): Improvement in symptoms so as ready for discharge Improvement in symptoms so as ready for discharge   Short Term Goals: Ability to verbalize feelings will improve Ability to demonstrate self-control will improve Ability to identify and  develop effective coping behaviors will improve Ability to maintain clinical measurements within normal limits will improve Compliance with prescribed medications will improve  Medication Management: Evaluate patient's response, side effects, and tolerance of medication regimen.  Therapeutic Interventions: 1 to 1 sessions, Unit Group sessions and Medication administration.  Evaluation of Outcomes: Progressing  Physician Treatment Plan for Secondary Diagnosis: Principal Problem:   Depression, major, recurrent, severe with psychosis (HCC) Active Problems:   Opiate abuse, continuous (HCC)  Long Term Goal(s): Improvement in symptoms so as ready for discharge Improvement in symptoms so as ready for discharge   Short Term Goals: Ability to verbalize feelings will improve Ability to demonstrate self-control will improve Ability to identify and develop effective coping behaviors will improve Ability to maintain clinical measurements within normal limits will improve Compliance with prescribed medications will improve     Medication Management: Evaluate patient's response, side effects, and tolerance of medication regimen.  Therapeutic Interventions: 1 to 1 sessions, Unit Group sessions and Medication administration.  Evaluation of Outcomes: Progressing   RN Treatment Plan for Primary Diagnosis: Depression, major, recurrent, severe with psychosis (HCC) Long Term Goal(s): Knowledge of disease and therapeutic regimen to maintain health will improve  Short Term Goals: Ability to demonstrate self-control, Ability to participate in decision making will improve, Ability to verbalize feelings will improve, Ability to identify and develop effective coping behaviors will improve and Compliance with prescribed medications will improve  Medication Management: RN will administer medications as ordered by provider, will assess and evaluate patient's response and provide education to patient for  prescribed medication. RN will report any adverse and/or side effects to prescribing provider.  Therapeutic Interventions: 1 on 1 counseling sessions, Psychoeducation, Medication administration, Evaluate responses to treatment, Monitor vital signs and CBGs as ordered, Perform/monitor CIWA, COWS, AIMS and Fall Risk screenings as ordered, Perform wound care treatments as ordered.  Evaluation of Outcomes: Progressing   LCSW Treatment Plan for Primary Diagnosis: Depression, major, recurrent, severe with psychosis (HCC) Long Term Goal(s): Safe transition to appropriate next level of care at discharge, Engage patient in therapeutic group addressing interpersonal concerns.  Short Term Goals: Engage patient in aftercare planning with referrals and resources, Increase ability to appropriately verbalize feelings, Increase emotional regulation, Facilitate acceptance of mental health diagnosis and concerns, Identify triggers associated with mental health/substance abuse issues and Increase skills for wellness and recovery  Therapeutic Interventions: Assess for all discharge needs, 1 to 1 time with Social worker, Explore available resources and support systems, Assess for adequacy in community support network, Educate family and significant other(s) on suicide prevention, Complete Psychosocial Assessment, Interpersonal group therapy.  Evaluation of Outcomes: Progressing   Progress in Treatment: Attending groups: Yes. Participating in groups: Yes. Taking medication as prescribed: Yes. Toleration medication: Yes. Family/Significant other contact made: Yes, individual(s) contacted:  pt declined consent. Patient  understands diagnosis: Yes. Discussing patient identified problems/goals with staff: Yes. Medical problems stabilized or resolved: Yes. Denies suicidal/homicidal ideation: Yes. Issues/concerns per patient self-inventory: No. Other: N/A  New problem(s) identified: No, Describe:  None.  New  Short Term/Long Term Goal(s): Attend outpatient treatment, take medication as prescribed, develop and implement healthy coping methods  Patient Goals: Pt did not attend mtg. No updated goals noted.    Discharge Plan or Barriers: Pt is homeless, living in vehicle. Pt will follow up with OPS and medication management at time of discharge.   Reason for Continuation of Hospitalization: Delusions  Medication stabilization  Estimated Length of Stay: 1-2 days  Attendees: Patient: Pt did not attend. 01/29/2020 3:14 PM  Physician: Dr. Smith Robert, MD 01/29/2020 3:14 PM  Nursing:  01/29/2020 3:14 PM  RN Care Manager: 01/29/2020 3:14 PM  Social Worker: Susa Simmonds Adalberto Ill LCSW 01/29/2020 3:14 PM  Recreational Therapist:  01/29/2020 3:14 PM  Other:  01/29/2020 3:14 PM  Other:  01/29/2020 3:14 PM  Other: 01/29/2020 3:14 PM    Scribe for Treatment Team: Leisa Lenz, LCSW 01/29/2020 3:14 PM

## 2020-01-29 NOTE — Progress Notes (Signed)
Patient asked this writer to come talk to her in her room and when this writer got there, patient stated "as much as I'm in pain, I don't want to take these and get in trouble". Patient was referring to two pills that were given to her by another patient, that she would rather not name because "I don't want them to get in trouble, they already think I'm a snitch and a Narc".

## 2020-01-30 MED ORDER — ARIPIPRAZOLE 10 MG PO TABS: 20.0000 mg | ORAL_TABLET | Freq: Every day | ORAL | Status: DC

## 2020-01-30 MED ORDER — DULOXETINE HCL 30 MG PO CPEP
30.0000 mg | ORAL_CAPSULE | Freq: Every day | ORAL | Status: DC
  Administered 2020-01-30 – 2020-02-01 (×3): 30 mg via ORAL
  Filled 2020-01-30 (×3): qty 1

## 2020-01-30 MED ORDER — MIRTAZAPINE 15 MG PO TABS
15.0000 mg | ORAL_TABLET | Freq: Every day | ORAL | Status: DC
  Administered 2020-01-31: 15 mg via ORAL
  Filled 2020-01-30 (×2): qty 1
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
## 2020-01-30 NOTE — Progress Notes (Signed)
Recreation Therapy Notes  Date: 01/30/2020  Time: 9:30 am  Location: Craft-room    Behavioral response: Appropriate  Intervention Topic: Stress Management   Discussion/Intervention:  Group content on today was focused on stress. The group defined stress and way to cope with stress. Participants expressed how they know when they are stresses out. Individuals described the different ways they have to cope with stress. The group stated reasons why it is important to cope with stress. Patient explained what good stress is and some examples. The group participated in the intervention "Stress Management". Individuals were separated into two group and answered questions related to stress.  Clinical Observations/Feedback:  Patient came to group and defined stress as something that upsets you and worrying. She expressed that she deals with stress by smoking cigarettes, walking, and reading. Participant identified people, living environment and bills as her stressors. Individual was social with peers and staff while participating in the intervention.   Ceil Roderick LRT/CTRS          Lendell Gallick 01/30/2020 1:26 PM

## 2020-01-30 NOTE — Plan of Care (Signed)
D- Patient alert and oriented. Patient presents in an anxious, but pleasant mood on assessment stating that she slept "pretty good" last night. Patient had complaints of a headache, rating her pain level a "7/10", in which she did request pain medication from this Clinical research associate. Patient reported that her depression and anxiety is "getting better". Patient denies SI, HI, AVH at this time. Patient's goal for today is "I want to go home", in which she will do "everything that is possible", in order to achieve her goal.  A- Scheduled medications administered to patient, per MD orders. Support and encouragement provided.  Routine safety checks conducted every 15 minutes.  Patient informed to notify staff with problems or concerns.  R- No adverse drug reactions noted. Patient contracts for safety at this time. Patient compliant with medications and treatment plan. Patient receptive, calm, and cooperative. Patient interacts well with others on the unit.  Patient remains safe at this time.  Problem: Education: Goal: Knowledge of [facility] Education information/materials will improve Outcome: Progressing Goal: Emotional status will improve Outcome: Progressing Goal: Mental status will improve Outcome: Progressing Goal: Verbalization of understanding the information provided will improve Outcome: Progressing   Problem: Activity: Goal: Interest or engagement in activities will improve Outcome: Progressing Goal: Sleeping patterns will improve Outcome: Progressing   Problem: Coping: Goal: Ability to verbalize frustrations and anger appropriately will improve Outcome: Progressing Goal: Ability to demonstrate self-control will improve Outcome: Progressing   Problem: Health Behavior/Discharge Planning: Goal: Identification of resources available to assist in meeting health care needs will improve Outcome: Progressing Goal: Compliance with treatment plan for underlying cause of condition will  improve Outcome: Progressing   Problem: Physical Regulation: Goal: Ability to maintain clinical measurements within normal limits will improve Outcome: Progressing   Problem: Safety: Goal: Periods of time without injury will increase Outcome: Progressing   Problem: Activity: Goal: Will verbalize the importance of balancing activity with adequate rest periods Outcome: Progressing   Problem: Education: Goal: Will be free of psychotic symptoms Outcome: Progressing Goal: Knowledge of the prescribed therapeutic regimen will improve Outcome: Progressing   Problem: Coping: Goal: Coping ability will improve Outcome: Progressing Goal: Will verbalize feelings Outcome: Progressing   Problem: Health Behavior/Discharge Planning: Goal: Compliance with prescribed medication regimen will improve Outcome: Progressing   Problem: Nutritional: Goal: Ability to achieve adequate nutritional intake will improve Outcome: Progressing   Problem: Role Relationship: Goal: Ability to communicate needs accurately will improve Outcome: Progressing Goal: Ability to interact with others will improve Outcome: Progressing   Problem: Safety: Goal: Ability to redirect hostility and anger into socially appropriate behaviors will improve Outcome: Progressing Goal: Ability to remain free from injury will improve Outcome: Progressing   Problem: Self-Care: Goal: Ability to participate in self-care as condition permits will improve Outcome: Progressing   Problem: Self-Concept: Goal: Will verbalize positive feelings about self Outcome: Progressing

## 2020-01-30 NOTE — Plan of Care (Signed)
  Problem: Education: Goal: Knowledge of [facility] Education information/materials will improve Outcome: Progressing Goal: Emotional status will improve Outcome: Progressing Goal: Mental status will improve Outcome: Progressing Goal: Verbalization of understanding the information provided will improve Outcome: Progressing   Problem: Activity: Goal: Interest or engagement in activities will improve Outcome: Progressing Goal: Sleeping patterns will improve Outcome: Progressing   

## 2020-01-30 NOTE — Progress Notes (Signed)
Patient is pleasant and cooperative. Still appears depressed and slightly paranoid. Wanted to make sure I knew she was on Klonopin three times a day even though it was not due on this shift. Denies SI HI and AVH.

## 2020-01-31 MED ORDER — ARIPIPRAZOLE 5 MG PO TABS
15.0000 mg | ORAL_TABLET | Freq: Every day | ORAL | Status: DC
  Administered 2020-01-31: 15 mg via ORAL
  Filled 2020-01-31: qty 1

## 2020-01-31 NOTE — Plan of Care (Signed)
  Problem: Education: Goal: Knowledge of [facility] Education information/materials will improve Outcome: Progressing Goal: Emotional status will improve Outcome: Progressing Goal: Mental status will improve Outcome: Progressing Goal: Verbalization of understanding the information provided will improve Outcome: Progressing   Problem: Activity: Goal: Interest or engagement in activities will improve Outcome: Progressing Goal: Sleeping patterns will improve Outcome: Progressing   
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
## 2020-01-31 NOTE — Progress Notes (Signed)
D: Pt alert and oriented x 4. Pt rates anxiety 7/10, prn meds givne. Pt reports experiencing 9/10 headache pain. Pt denies experiencing any SI/HI, or AVH at this time.   Pt reports that she wants to discharge and is preoccupied with this thought. Pt has request to speak to MD. MD notified and has spoken with pt.  A: Scheduled medications administered to pt, per MD orders. Support and encouragement provided. Frequent verbal contact made. Routine safety checks conducted q15 minutes.   R: No adverse drug reactions noted. Pt verbally contracts for safety at this time. Pt complaint with medications and treatment plan. Pt interacts well with others on the unit. Pt remains safe at this time. Will continue to monitor.

## 2020-01-31 NOTE — Progress Notes (Signed)
Recreation Therapy Notes   Date: 01/31/2020  Time: 9:30 am  Location: Craft-room    Behavioral response: Appropriate  Intervention Topic: Values  Discussion/Intervention:  Group content today was focused on values. The group identified what values are and where they come from. Individuals expressed some values and how many they have. Patients described how they go about add or removing values. The group described the importance of having values and how they go about using them in daily life. Patient participated in the intervention "My Values" where they were able to pick out values that were important to them and made a visual aide.  Clinical Observations/Feedback:  Patient came to group and defined values as something that comes from within yourself.She identified her values as security , family and respect.  Individual was social with peers and staff while participating in the intervention.  Katherine Burgess LRT/CTRS         Katherine Burgess 01/31/2020 1:33 PM

## 2020-01-31 NOTE — Progress Notes (Signed)
Patient has been calm. Denies SI, HI and AVH. Continues to be slightly paranoid. Refused to take her Remeron after medication teaching was provided. When I told patient I heard she was going home she said "who said that?" and said "no one told me that--how did you know that?". Still appears suspicious.

## 2020-02-01 MED ORDER — DULOXETINE HCL 30 MG PO CPEP: 30.0000 mg | ORAL_CAPSULE | Freq: Every day | ORAL | 1 refills | Status: DC

## 2020-02-01 MED ORDER — MIRTAZAPINE 15 MG PO TABS: 15.0000 mg | ORAL_TABLET | Freq: Every day | ORAL | 1 refills | Status: DC

## 2020-02-01 MED ORDER — ALBUTEROL SULFATE HFA 108 (90 BASE) MCG/ACT IN AERS: 2.0000 | INHALATION_SPRAY | Freq: Four times a day (QID) | RESPIRATORY_TRACT | 1 refills | Status: DC | PRN

## 2020-02-01 MED ORDER — ARIPIPRAZOLE 15 MG PO TABS: 15.0000 mg | ORAL_TABLET | Freq: Every day | ORAL | 1 refills | Status: DC

## 2020-02-01 NOTE — Progress Notes (Addendum)
Recreation Therapy Notes  Date: 02/01/2020  Time: 9:30 am  Location: Court yard   Behavioral response: Appropriate  Intervention Topic: Leisure  Discussion/Intervention:  Group content today was focused on leisure. The group defined what leisure is and some positive leisure activities they participate in. Individuals identified the difference between good and bad leisure. Participants expressed how they feel after participating in the leisure of their choice. The group discussed how they go about picking a leisure activity and if others are involved in their leisure activities. The patient stated how many leisure activities they have to choose from and reasons why it is important to have leisure time. Individuals participated in the intervention "Exploration of Leisure" where they had a chance to identify new leisure activities as well as benefits of leisure. Clinical Observations/Feedback:  Patient came to group and explained that for leisure time she like to spend time alone and watch television. Individual was social with peers and staff while participating in the intervention. Participant left group early due to unknown reasons and did not return. Danny Zimny LRT/CTRS           Laylynn Campanella 02/01/2020 12:35 PM

## 2020-02-01 NOTE — Progress Notes (Signed)
D: Pt alert and oriented x 4. Pt rates anxiety 3/10. Pt reports experiencing 7/10 headache pain, prn med given. Pt denies experiencing any SI/HI, or AVH at this time.   A: Scheduled medications administered to pt, per MD orders. Support and encouragement provided. Frequent verbal contact made. Routine safety checks conducted q15 minutes.   R: No adverse drug reactions noted. Pt verbally contracts for safety at this time. Pt complaint with medications and treatment plan. Pt interacts well with others on the unit. Pt remains safe at this time. Will continue to monitor.

## 2020-02-01 NOTE — Progress Notes (Signed)
  [facility] Adult Case Management Discharge Plan :  Will you be returning to the same living situation after discharge:  Yes,  pt reports that she is returning home. At discharge, do you have transportation home?: Yes,  CSW will assist with transportation needs. Do you have the ability to pay for your medications: Yes,  [facility].  Release of information consent forms completed and in the chart;  Patient's signature needed at discharge.  Patient to Follow up at:  Follow-up Information    [facility] Follow up on 02/07/2020.   Why: You are scheduled to meet with Dr. Evelene Croon on 7/27 at 1120am for an in person appointment. You are scheduled to meet with Rockne Menghini for therapy on 8/5 at 9am for in person appointment. Thank you. Contact information:  [address] [place], [place] [address] Ph:[phone] Fax:              Next level of care provider has access to [facility] Link:no  Safety Planning and Suicide Prevention discussed: Yes,  SPE completed with the patient.  Have you used any form of tobacco in the last 30 days? (Cigarettes, Smokeless Tobacco, Cigars, and/or Pipes): Yes  Has patient been referred to the Quitline?: Patient refused referral  Patient has been referred for addiction treatment: Pt. refused referral  Harden Mo, LCSW 02/01/2020, 11:29 AM

## 2020-02-01 NOTE — BHH Suicide Risk Assessment (Signed)
[facility] Discharge Suicide Risk Assessment   Principal Problem: Depression, major, recurrent, severe with psychosis (HCC) Discharge Diagnoses: Principal Problem:   Depression, major, recurrent, severe with psychosis (HCC) Active Problems:   Opiate abuse, continuous (HCC)   Total Time spent with patient: 30 or so  Musculoskeletal: Strength & Muscle Tone: normal  Gait & Station:  Normal  Patient leans:   Psychiatric Specialty Exam: Review of Systems  Blood pressure 102/67, pulse 67, temperature 98.3 F (36.8 C), temperature source Oral, resp. rate 17, height 5' (1.524 m), weight 65.8 kg, SpO2 99 %.Body mass index is 28.32 kg/m.  Mental Status Per Nursing Assessment::   On Admission:  Suicidal ideation indicated by patient, Suicidal ideation indicated by others    Mental Status already written in today's Discharge Summary   No active SI HI or plans No active HI   Contracts for safety    Demographic Factors:   Native american issues; substance issues, HIV issues,  Dual diagnosis. Is alone --unresolved addictive personality problems lack of 12 step recovery   Loss Factors: Cultural identity feels family fragmented   Historical Factors: Drug issues, chronic pain HIV ----social stress and deprivation  Risk Reduction Factors:   Referred to [place] support  Medications   Group and family therapy and day treatment   Continued Clinical Symptoms:  Levels of depression but can be outpatient   Cognitive Features That Contribute To Risk:   Victim role, poor compliance needs program continuity     Suicide Risk:     Well below medium / slightly above low    Follow-up Information    [facility] Follow up on 02/07/2020.   Why: You are scheduled to meet with Dr. Evelene Croon on 7/27 at 1120am for an in person appointment. You are scheduled to meet with Rockne Menghini for therapy on 8/5 at 9am for in person appointment. Thank you. Contact information:  [address] [place], [place] [address] Ph:[phone] Fax:              Plan Of Care/Follow-up recommendations:   Voluntary status; contracts for safety  Wants to go to [place] support --advised 12 step groups service work vocational rehab women's support vocational training     Roselind Messier, MD 02/01/2020, 10:26 AM

## 2020-02-01 NOTE — Progress Notes (Signed)
D: Patient denies SI/HI/AVH, affect is blunted thoughts are organized and coherent, she is pleasant and cooperative with staff , she appears less anxious is interacting with peers and staff appropriately.  A: Patient  was offered support and encouragement, given scheduled medications and was encouraged to attend groups. Q 15 minute checks were done for safety.  R: Patient attends groups and interacts well with peers and staff, she is complaint with medication, and receptive to treatment, 15 minutes safety checks maintained on unit.

## 2020-02-01 NOTE — Progress Notes (Signed)
D: Pt alert and oriented x 4. Pt denies experiencing any pain, SI/HI, or AVH at this time. Pt reports she will be able to keep herself safe when she returns home.   A: Pt received discharge and medication education/information. Pt belongings were returned and signed for at this time.   R: Pt verbalized understanding of discharge and medication education/information.  Pt escorted to medical mall front lobby where pt was picked up by safe transport.

## 2020-02-01 NOTE — Plan of Care (Signed)
  Problem: Group Participation Goal: STG - Patient will engage in groups without prompting or encouragement from LRT x3 group sessions within 5 recreation therapy group sessions Description: STG - Patient will engage in groups without prompting or encouragement from LRT x3 group sessions within 5 recreation therapy group sessions Outcome: Completed/Met

## 2020-02-01 NOTE — Progress Notes (Signed)
Recreation Therapy Notes  INPATIENT RECREATION TR PLAN  Patient Details Name: Katherine Burgess MRN: [id number] DOB: 1980-10-05 Today's Date: 02/01/2020  Rec Therapy Plan Is patient appropriate for Therapeutic Recreation?: Yes Treatment times per week: at least 3 Estimated Length of Stay: 5-7 days TR Treatment/Interventions: Group participation (Comment)  Discharge Criteria Pt will be discharged from therapy if:: Discharged Treatment plan/goals/alternatives discussed and agreed upon by:: Patient/family  Discharge Summary Short term goals set: Patient will engage in groups without prompting or encouragement from LRT x3 group sessions within 5 recreation therapy group sessions Short term goals met: Complete Progress toward goals comments: Groups attended Which groups?: Leisure education, Stress management, Other (Comment) (Values, Self-care) Reason goals not met: N/A Therapeutic equipment acquired: N/A Reason patient discharged from therapy: Discharge from hospital Pt/family agrees with progress & goals achieved: Yes Date patient discharged from therapy: 02/01/20   Oletta Buehring 02/01/2020, 3:01 PM
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
## 2020-02-06 ENCOUNTER — Other Ambulatory Visit: Payer: Self-pay

## 2020-02-06 ENCOUNTER — Emergency Department (HOSPITAL_COMMUNITY)
Admission: EM | Admit: 2020-02-06 | Discharge: 2020-02-07 | Discharge status: Against Medical Advice | Payer: Medicaid Other | Attending: Emergency Medicine | Admitting: Emergency Medicine

## 2020-02-06 ENCOUNTER — Ambulatory Visit (HOSPITAL_COMMUNITY)
Admission: EM | Admit: 2020-02-06 | Discharge: 2020-02-06 | Disposition: A | Discharge status: Home / Self Care | Payer: Medicaid Other | Attending: Psychiatry | Admitting: Psychiatry

## 2020-02-06 DIAGNOSIS — T50905A Adverse effect of unspecified drugs, medicaments and biological substances, initial encounter: Secondary | ICD-10-CM

## 2020-02-06 DIAGNOSIS — Z5321 Procedure and treatment not carried out due to patient leaving prior to being seen by health care provider: Secondary | ICD-10-CM | POA: Diagnosis not present

## 2020-02-06 DIAGNOSIS — R112 Nausea with vomiting, unspecified: Secondary | ICD-10-CM | POA: Insufficient documentation

## 2020-02-06 DIAGNOSIS — G4700 Insomnia, unspecified: Secondary | ICD-10-CM | POA: Insufficient documentation

## 2020-02-06 DIAGNOSIS — R450 Nervousness: Secondary | ICD-10-CM | POA: Insufficient documentation

## 2020-02-06 LAB — RAPID URINE DRUG SCREEN, HOSP PERFORMED
Amphetamines: NOT DETECTED
Barbiturates: NOT DETECTED
Benzodiazepines: NOT DETECTED
Cocaine: NOT DETECTED
Opiates: POSITIVE — AB
Tetrahydrocannabinol: POSITIVE — AB

## 2020-02-06 NOTE — ED Provider Notes (Signed)
Behavioral Health Medical Screening Exam  Katherine Burgess is a 39 y.o. female.  Patient presented to the [facility] reporting medication reaction to medications are prescribed to her at this facility.  Patient was waiting for assessment and I was notified that the patient was vomiting in the lobby.  I went out to assess the patient and there was no one else in the lobby except for this patient.  Patient reports to me that for the last 3 days that she is not been able to sleep, nausea, vomiting, racing heart, severe anxiety and restlessness.  Informed patient that I felt she needed to be evaluated in the emergency department due to the severity and the acute onset of the symptoms.  Patient stated understanding and agreement and she was offered safe transport or EMS transportation and patient refused and stated that her father was in the parking lot and she would have him take her to the emergency department at [facility].  I continue to offer transportation and she again refused.  I walked with patient to the car and she ambulates and spoke appropriately.  Patient's father stated that he would take her and that we did not need to contact anyone for transportation for her.  Patient felt that it was just a medication that I was not agreeing with her.  Patient did not report any suicidal or homicidal ideations and did not report any hallucinations.  I did contact [facility] emergency department and spoke with the nurse to notify her that the patient was being transported by her father over there for the symptoms.  Total Time spent with patient: 20 minutes  Psychiatric Specialty Exam  Presentation  General Appearance:Casual;Fairly Groomed  Eye Contact:Good  Speech:Clear and Coherent;Normal Rate  Speech Volume:Normal  Handedness:Right   Mood and Affect  Mood:No data recorded Affect:No data recorded  Thought Process  Thought Processes:No data recorded Descriptions of Associations:No data  recorded Orientation:No data recorded Thought Content:No data recorded Hallucinations:No data recorded Ideas of Reference:No data recorded Suicidal Thoughts:No data recorded Homicidal Thoughts:No data recorded  Sensorium  Memory:No data recorded Judgment:No data recorded Insight:No data recorded  Executive Functions  Concentration:No data recorded Attention Span:No data recorded Recall:No data recorded Fund of Knowledge:No data recorded Language:No data recorded  Psychomotor Activity  Psychomotor Activity:No data recorded  Assets  Assets:No data recorded  Sleep  Sleep:Poor  Number of hours: No data recorded  Physical Exam: Physical Exam Constitutional:      Appearance: She is well-developed.  Pulmonary:     Effort: Pulmonary effort is normal.  Musculoskeletal:        General: Normal range of motion.  Skin:    General: Skin is warm.  Neurological:     Mental Status: She is alert and oriented to person, place, and time.  Psychiatric:        Mood and Affect: Mood is anxious.    Review of Systems  Gastrointestinal: Positive for nausea and vomiting.  Psychiatric/Behavioral: The patient is nervous/anxious and has insomnia.    There were no vitals taken for this visit. There is no height or weight on file to calculate BMI.  Musculoskeletal: Strength & Muscle Tone: within normal limits Gait & Station: normal Patient leans: N/A   Recommendations:  Based on my evaluation the patient appears to have an emergency medical condition for which I recommend the patient be transferred to the emergency department for further evaluation.  Patient refused to be transported by EMS and stated her father  was in the parking lot. Her father agreed to take her and he also stated that we did not need to provide any type of transportation.   Maryfrances Bunnell, FNP 02/06/2020, 12:36 PM

## 2020-02-07 ENCOUNTER — Ambulatory Visit (INDEPENDENT_AMBULATORY_CARE_PROVIDER_SITE_OTHER): Payer: Medicaid Other | Admitting: Psychiatry

## 2020-02-07 ENCOUNTER — Other Ambulatory Visit: Payer: Self-pay

## 2020-02-07 ENCOUNTER — Encounter (HOSPITAL_COMMUNITY): Payer: Self-pay | Admitting: Psychiatry

## 2020-02-07 DIAGNOSIS — G2571 Drug induced akathisia: Secondary | ICD-10-CM

## 2020-02-07 DIAGNOSIS — F1220 Cannabis dependence, uncomplicated: Secondary | ICD-10-CM

## 2020-02-07 DIAGNOSIS — F3177 Bipolar disorder, in partial remission, most recent episode mixed: Secondary | ICD-10-CM | POA: Diagnosis not present

## 2020-02-07 DIAGNOSIS — F419 Anxiety disorder, unspecified: Secondary | ICD-10-CM

## 2020-02-07 DIAGNOSIS — T43595D Adverse effect of other antipsychotics and neuroleptics, subsequent encounter: Secondary | ICD-10-CM | POA: Diagnosis not present

## 2020-02-07 DIAGNOSIS — F1110 Opioid abuse, uncomplicated: Secondary | ICD-10-CM

## 2020-02-07 MED ORDER — CITALOPRAM HYDROBROMIDE 20 MG PO TABS: 20.0000 mg | ORAL_TABLET | Freq: Every day | ORAL | 1 refills | Status: DC

## 2020-02-07 MED ORDER — HYDROXYZINE HCL 25 MG PO TABS: 25.0000 mg | ORAL_TABLET | Freq: Three times a day (TID) | ORAL | 0 refills | Status: DC | PRN

## 2020-02-07 MED ORDER — LURASIDONE HCL 40 MG PO TABS: 40.0000 mg | ORAL_TABLET | Freq: Every day | ORAL | 1 refills | Status: DC
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
## 2020-02-08 ENCOUNTER — Other Ambulatory Visit: Payer: Self-pay

## 2020-02-08 ENCOUNTER — Emergency Department
Admission: EM | Admit: 2020-02-08 | Discharge: 2020-02-08 | Disposition: A | Discharge status: Home / Self Care | Payer: Medicaid Other | Attending: Emergency Medicine | Admitting: Emergency Medicine

## 2020-02-08 DIAGNOSIS — F19232 Other psychoactive substance dependence with withdrawal with perceptual disturbance: Secondary | ICD-10-CM | POA: Insufficient documentation

## 2020-02-08 DIAGNOSIS — Z5321 Procedure and treatment not carried out due to patient leaving prior to being seen by health care provider: Secondary | ICD-10-CM | POA: Insufficient documentation

## 2020-02-08 LAB — URINE DRUG SCREEN, QUALITATIVE (ARMC ONLY)
Amphetamines, Ur Screen: NOT DETECTED
Barbiturates, Ur Screen: NOT DETECTED
Benzodiazepine, Ur Scrn: NOT DETECTED
Cannabinoid 50 Ng, Ur ~~LOC~~: POSITIVE — AB
Cocaine Metabolite,Ur ~~LOC~~: NOT DETECTED
MDMA (Ecstasy)Ur Screen: NOT DETECTED
Methadone Scn, Ur: NOT DETECTED
Opiate, Ur Screen: POSITIVE — AB
Phencyclidine (PCP) Ur S: NOT DETECTED
Tricyclic, Ur Screen: POSITIVE — AB

## 2020-02-08 LAB — COMPREHENSIVE METABOLIC PANEL
ALT: 17 U/L (ref 0–44)
AST: 16 U/L (ref 15–41)
Albumin: 4.6 g/dL (ref 3.5–5.0)
Alkaline Phosphatase: 59 U/L (ref 38–126)
Anion gap: 10 (ref 5–15)
BUN: 9 mg/dL (ref 6–20)
CO2: 26 mmol/L (ref 22–32)
Calcium: 9.2 mg/dL (ref 8.9–10.3)
Chloride: 103 mmol/L (ref 98–111)
Creatinine, Ser: 0.72 mg/dL (ref 0.44–1.00)
GFR calc Af Amer: >60 mL/min (ref >60)
GFR calc non Af Amer: >60 mL/min (ref >60)
Glucose, Bld: 106 mg/dL — ABNORMAL HIGH (ref 70–99)
Potassium: 3.6 mmol/L (ref 3.5–5.1)
Sodium: 139 mmol/L (ref 135–145)
Total Bilirubin: 0.7 mg/dL (ref 0.3–1.2)
Total Protein: 8.1 g/dL (ref 6.5–8.1)

## 2020-02-08 LAB — CBC
HCT: 38.5 % (ref 36.0–46.0)
Hemoglobin: 12.9 g/dL (ref 12.0–15.0)
MCH: 32 pg (ref 26.0–34.0)
MCHC: 33.5 g/dL (ref 30.0–36.0)
MCV: 95.5 fL (ref 80.0–100.0)
Platelets: 402 10*3/uL — ABNORMAL HIGH (ref 150–400)
RBC: 4.03 MIL/uL (ref 3.87–5.11)
RDW: 12.8 % (ref 11.5–15.5)
WBC: 8.7 10*3/uL (ref 4.0–10.5)
nRBC: 0 % (ref 0.0–0.2)

## 2020-02-08 LAB — TROPONIN I (HIGH SENSITIVITY): Troponin I (High Sensitivity): 3 ng/L (ref <18)

## 2020-02-08 LAB — ETHANOL: Alcohol, Ethyl (B): <10 mg/dL (ref <10)

## 2020-02-08 NOTE — ED Notes (Signed)
This tech assisted pt to change into scrubs. Pt belongings include: red and black purse, silver nose ring, 2x rings silver and gold, smartphone, blue tshirt, black pant, and white bra.  Bag of meds

## 2020-02-08 NOTE — ED Triage Notes (Signed)
Here to be evaluated.  States she is withdrawing from opioids. Used Oxycodone- 2 tabs 10 mg and marijuana this morning.  Denies SI/ HI.  States she feels like there is a snake coming out of her ear.  Awake and alert.  Calm and cooperative in triage. NAD

## 2020-02-11 ENCOUNTER — Other Ambulatory Visit: Payer: Self-pay

## 2020-02-11 ENCOUNTER — Emergency Department
Admission: EM | Admit: 2020-02-11 | Discharge: 2020-02-12 | Disposition: A | Discharge status: Home / Self Care | Payer: Medicaid Other | Attending: Emergency Medicine | Admitting: Emergency Medicine

## 2020-02-11 DIAGNOSIS — J45909 Unspecified asthma, uncomplicated: Secondary | ICD-10-CM | POA: Diagnosis not present

## 2020-02-11 DIAGNOSIS — Z20822 Contact with and (suspected) exposure to covid-19: Secondary | ICD-10-CM | POA: Insufficient documentation

## 2020-02-11 DIAGNOSIS — Z91040 Latex allergy status: Secondary | ICD-10-CM | POA: Insufficient documentation

## 2020-02-11 DIAGNOSIS — F1310 Sedative, hypnotic or anxiolytic abuse, uncomplicated: Secondary | ICD-10-CM

## 2020-02-11 DIAGNOSIS — N739 Female pelvic inflammatory disease, unspecified: Secondary | ICD-10-CM | POA: Diagnosis not present

## 2020-02-11 DIAGNOSIS — F1129 Opioid dependence with unspecified opioid-induced disorder: Secondary | ICD-10-CM | POA: Insufficient documentation

## 2020-02-11 DIAGNOSIS — F1329 Sedative, hypnotic or anxiolytic dependence with unspecified sedative, hypnotic or anxiolytic-induced disorder: Secondary | ICD-10-CM | POA: Diagnosis not present

## 2020-02-11 DIAGNOSIS — F332 Major depressive disorder, recurrent severe without psychotic features: Secondary | ICD-10-CM | POA: Insufficient documentation

## 2020-02-11 DIAGNOSIS — F1110 Opioid abuse, uncomplicated: Secondary | ICD-10-CM | POA: Diagnosis present

## 2020-02-11 DIAGNOSIS — F17210 Nicotine dependence, cigarettes, uncomplicated: Secondary | ICD-10-CM | POA: Insufficient documentation

## 2020-02-11 DIAGNOSIS — N730 Acute parametritis and pelvic cellulitis: Secondary | ICD-10-CM

## 2020-02-11 DIAGNOSIS — F329 Major depressive disorder, single episode, unspecified: Secondary | ICD-10-CM | POA: Diagnosis not present

## 2020-02-11 DIAGNOSIS — F1220 Cannabis dependence, uncomplicated: Secondary | ICD-10-CM | POA: Diagnosis present

## 2020-02-11 DIAGNOSIS — F32A Depression, unspecified: Secondary | ICD-10-CM

## 2020-02-11 LAB — POCT PREGNANCY, URINE: Preg Test, Ur: NEGATIVE

## 2020-02-11 LAB — COMPREHENSIVE METABOLIC PANEL
ALT: 13 U/L (ref 0–44)
AST: 17 U/L (ref 15–41)
Albumin: 4 g/dL (ref 3.5–5.0)
Alkaline Phosphatase: 58 U/L (ref 38–126)
Anion gap: 9 (ref 5–15)
BUN: 10 mg/dL (ref 6–20)
CO2: 24 mmol/L (ref 22–32)
Calcium: 9.1 mg/dL (ref 8.9–10.3)
Chloride: 105 mmol/L (ref 98–111)
Creatinine, Ser: 0.71 mg/dL (ref 0.44–1.00)
GFR calc Af Amer: >60 mL/min (ref >60)
GFR calc non Af Amer: >60 mL/min (ref >60)
Glucose, Bld: 94 mg/dL (ref 70–99)
Potassium: 3.2 mmol/L — ABNORMAL LOW (ref 3.5–5.1)
Sodium: 138 mmol/L (ref 135–145)
Total Bilirubin: 0.5 mg/dL (ref 0.3–1.2)
Total Protein: 7.3 g/dL (ref 6.5–8.1)

## 2020-02-11 LAB — CBC
HCT: 37.5 % (ref 36.0–46.0)
Hemoglobin: 12.7 g/dL (ref 12.0–15.0)
MCH: 32.6 pg (ref 26.0–34.0)
MCHC: 33.9 g/dL (ref 30.0–36.0)
MCV: 96.2 fL (ref 80.0–100.0)
Platelets: 371 10*3/uL (ref 150–400)
RBC: 3.9 MIL/uL (ref 3.87–5.11)
RDW: 13 % (ref 11.5–15.5)
WBC: 10.8 10*3/uL — ABNORMAL HIGH (ref 4.0–10.5)
nRBC: 0 % (ref 0.0–0.2)

## 2020-02-11 LAB — URINE DRUG SCREEN, QUALITATIVE (ARMC ONLY)
Amphetamines, Ur Screen: NOT DETECTED
Barbiturates, Ur Screen: NOT DETECTED
Benzodiazepine, Ur Scrn: POSITIVE — AB
Cannabinoid 50 Ng, Ur ~~LOC~~: POSITIVE — AB
Cocaine Metabolite,Ur ~~LOC~~: NOT DETECTED
MDMA (Ecstasy)Ur Screen: NOT DETECTED
Methadone Scn, Ur: NOT DETECTED
Opiate, Ur Screen: POSITIVE — AB
Phencyclidine (PCP) Ur S: NOT DETECTED
Tricyclic, Ur Screen: NOT DETECTED

## 2020-02-11 LAB — ETHANOL: Alcohol, Ethyl (B): <10 mg/dL (ref <10)

## 2020-02-11 LAB — WET PREP, GENITAL
Sperm: NONE SEEN
Trich, Wet Prep: NONE SEEN
Yeast Wet Prep HPF POC: NONE SEEN

## 2020-02-11 LAB — ACETAMINOPHEN LEVEL: Acetaminophen (Tylenol), Serum: <10 ug/mL — ABNORMAL LOW (ref 10–30)

## 2020-02-11 LAB — SALICYLATE LEVEL: Salicylate Lvl: <7 mg/dL — ABNORMAL LOW (ref 7.0–30.0)

## 2020-02-11 MED ORDER — CEFTRIAXONE SODIUM 1 G IJ SOLR
500.0000 mg | Freq: Once | INTRAMUSCULAR | Status: AC
  Administered 2020-02-12: 500 mg via INTRAMUSCULAR
  Filled 2020-02-11: qty 10

## 2020-02-11 MED ORDER — DOXYCYCLINE HYCLATE 100 MG PO TABS
100.0000 mg | ORAL_TABLET | Freq: Two times a day (BID) | ORAL | Status: DC
  Administered 2020-02-12: 100 mg via ORAL
  Filled 2020-02-11: qty 1

## 2020-02-11 MED ORDER — IBUPROFEN 600 MG PO TABS
600.0000 mg | ORAL_TABLET | Freq: Once | ORAL | Status: AC
  Administered 2020-02-12: 600 mg via ORAL
  Filled 2020-02-11: qty 1

## 2020-02-11 NOTE — ED Triage Notes (Signed)
Patient states "if I don't get some help I'm going to hurt myself."  Patient denies plan.  Patient reports racing thought and abusing oxycodone.
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
## 2020-02-12 ENCOUNTER — Emergency Department (EMERGENCY_DEPARTMENT_HOSPITAL)
Admission: EM | Admit: 2020-02-12 | Discharge: 2020-02-13 | Disposition: A | Discharge status: Home / Self Care | Payer: Medicaid Other | Source: Home / Self Care | Attending: Emergency Medicine | Admitting: Emergency Medicine

## 2020-02-12 ENCOUNTER — Other Ambulatory Visit: Payer: Self-pay

## 2020-02-12 ENCOUNTER — Encounter: Payer: Self-pay | Admitting: Emergency Medicine

## 2020-02-12 DIAGNOSIS — F329 Major depressive disorder, single episode, unspecified: Secondary | ICD-10-CM | POA: Diagnosis not present

## 2020-02-12 DIAGNOSIS — F1110 Opioid abuse, uncomplicated: Secondary | ICD-10-CM | POA: Diagnosis not present

## 2020-02-12 DIAGNOSIS — F32A Depression, unspecified: Secondary | ICD-10-CM

## 2020-02-12 DIAGNOSIS — Z20822 Contact with and (suspected) exposure to covid-19: Secondary | ICD-10-CM | POA: Insufficient documentation

## 2020-02-12 DIAGNOSIS — R1030 Lower abdominal pain, unspecified: Secondary | ICD-10-CM | POA: Insufficient documentation

## 2020-02-12 DIAGNOSIS — J45909 Unspecified asthma, uncomplicated: Secondary | ICD-10-CM | POA: Insufficient documentation

## 2020-02-12 DIAGNOSIS — Z91040 Latex allergy status: Secondary | ICD-10-CM | POA: Insufficient documentation

## 2020-02-12 DIAGNOSIS — F1310 Sedative, hypnotic or anxiolytic abuse, uncomplicated: Secondary | ICD-10-CM

## 2020-02-12 DIAGNOSIS — Z91010 Allergy to peanuts: Secondary | ICD-10-CM | POA: Insufficient documentation

## 2020-02-12 DIAGNOSIS — F17210 Nicotine dependence, cigarettes, uncomplicated: Secondary | ICD-10-CM | POA: Insufficient documentation

## 2020-02-12 LAB — URINE DRUG SCREEN, QUALITATIVE (ARMC ONLY)
Amphetamines, Ur Screen: NOT DETECTED
Barbiturates, Ur Screen: NOT DETECTED
Benzodiazepine, Ur Scrn: NOT DETECTED
Cannabinoid 50 Ng, Ur ~~LOC~~: POSITIVE — AB
Cocaine Metabolite,Ur ~~LOC~~: NOT DETECTED
MDMA (Ecstasy)Ur Screen: NOT DETECTED
Methadone Scn, Ur: NOT DETECTED
Opiate, Ur Screen: POSITIVE — AB
Phencyclidine (PCP) Ur S: NOT DETECTED
Tricyclic, Ur Screen: NOT DETECTED

## 2020-02-12 LAB — URINALYSIS, COMPLETE (UACMP) WITH MICROSCOPIC
Bilirubin Urine: NEGATIVE
Glucose, UA: NEGATIVE mg/dL
Ketones, ur: NEGATIVE mg/dL
Nitrite: NEGATIVE
Protein, ur: NEGATIVE mg/dL
Specific Gravity, Urine: 1.009 (ref 1.005–1.030)
pH: 6 (ref 5.0–8.0)

## 2020-02-12 LAB — CBC
HCT: 38.8 % (ref 36.0–46.0)
Hemoglobin: 13.1 g/dL (ref 12.0–15.0)
MCH: 32.2 pg (ref 26.0–34.0)
MCHC: 33.8 g/dL (ref 30.0–36.0)
MCV: 95.3 fL (ref 80.0–100.0)
Platelets: 425 10*3/uL — ABNORMAL HIGH (ref 150–400)
RBC: 4.07 MIL/uL (ref 3.87–5.11)
RDW: 12.9 % (ref 11.5–15.5)
WBC: 8.6 10*3/uL (ref 4.0–10.5)
nRBC: 0 % (ref 0.0–0.2)

## 2020-02-12 LAB — PREGNANCY, URINE: Preg Test, Ur: NEGATIVE

## 2020-02-12 LAB — SALICYLATE LEVEL: Salicylate Lvl: <7 mg/dL — ABNORMAL LOW (ref 7.0–30.0)

## 2020-02-12 LAB — ETHANOL: Alcohol, Ethyl (B): <10 mg/dL (ref <10)

## 2020-02-12 LAB — COMPREHENSIVE METABOLIC PANEL
ALT: 13 U/L (ref 0–44)
AST: 17 U/L (ref 15–41)
Albumin: 4.4 g/dL (ref 3.5–5.0)
Alkaline Phosphatase: 64 U/L (ref 38–126)
Anion gap: 10 (ref 5–15)
BUN: 8 mg/dL (ref 6–20)
CO2: 26 mmol/L (ref 22–32)
Calcium: 9.3 mg/dL (ref 8.9–10.3)
Chloride: 103 mmol/L (ref 98–111)
Creatinine, Ser: 0.64 mg/dL (ref 0.44–1.00)
GFR calc Af Amer: >60 mL/min (ref >60)
GFR calc non Af Amer: >60 mL/min (ref >60)
Glucose, Bld: 107 mg/dL — ABNORMAL HIGH (ref 70–99)
Potassium: 3.8 mmol/L (ref 3.5–5.1)
Sodium: 139 mmol/L (ref 135–145)
Total Bilirubin: 0.8 mg/dL (ref 0.3–1.2)
Total Protein: 8 g/dL (ref 6.5–8.1)

## 2020-02-12 LAB — ACETAMINOPHEN LEVEL
Acetaminophen (Tylenol), Serum: <10 ug/mL — ABNORMAL LOW (ref 10–30)
Acetaminophen (Tylenol), Serum: <10 ug/mL — ABNORMAL LOW (ref 10–30)

## 2020-02-12 LAB — CHLAMYDIA/NGC RT PCR (ARMC ONLY)
Chlamydia Tr: NOT DETECTED
N gonorrhoeae: NOT DETECTED

## 2020-02-12 LAB — SARS CORONAVIRUS 2 BY RT PCR (HOSPITAL ORDER, PERFORMED IN ~~LOC~~ HOSPITAL LAB)
SARS Coronavirus 2: NEGATIVE
SARS Coronavirus 2: NEGATIVE

## 2020-02-12 MED ORDER — THIAMINE HCL 100 MG/ML IJ SOLN: 100.0000 mg | Freq: Every day | INTRAMUSCULAR | Status: DC

## 2020-02-12 MED ORDER — ONDANSETRON HCL 4 MG PO TABS
4.0000 mg | ORAL_TABLET | ORAL | Status: DC | PRN
  Administered 2020-02-12: 4 mg via ORAL
  Filled 2020-02-12: qty 1

## 2020-02-12 MED ORDER — DOXYCYCLINE HYCLATE 100 MG PO TABS
100.0000 mg | ORAL_TABLET | Freq: Two times a day (BID) | ORAL | Status: DC
  Administered 2020-02-12 – 2020-02-13 (×2): 100 mg via ORAL
  Filled 2020-02-12 (×4): qty 1

## 2020-02-12 MED ORDER — ACETAMINOPHEN 325 MG PO TABS
650.0000 mg | ORAL_TABLET | Freq: Once | ORAL | Status: AC
  Administered 2020-02-12: 650 mg via ORAL
  Filled 2020-02-12: qty 2

## 2020-02-12 MED ORDER — LORAZEPAM 2 MG/ML IJ SOLN: 0.0000 mg | Freq: Two times a day (BID) | INTRAMUSCULAR | Status: DC

## 2020-02-12 MED ORDER — ONDANSETRON 4 MG PO TBDP
4.0000 mg | ORAL_TABLET | Freq: Once | ORAL | Status: AC
  Administered 2020-02-12: 4 mg via ORAL
  Filled 2020-02-12: qty 1

## 2020-02-12 MED ORDER — CLONIDINE HCL 0.1 MG PO TABS
0.1000 mg | ORAL_TABLET | Freq: Four times a day (QID) | ORAL | Status: DC | PRN
  Administered 2020-02-12: 0.1 mg via ORAL
  Filled 2020-02-12: qty 1

## 2020-02-12 MED ORDER — THIAMINE HCL 100 MG PO TABS
100.0000 mg | ORAL_TABLET | Freq: Every day | ORAL | Status: DC
  Administered 2020-02-12 – 2020-02-13 (×2): 100 mg via ORAL
  Filled 2020-02-12 (×2): qty 1

## 2020-02-12 MED ORDER — DOXYCYCLINE HYCLATE 100 MG PO CAPS
100.0000 mg | ORAL_CAPSULE | Freq: Two times a day (BID) | ORAL | 0 refills | Status: AC
Start: 2020-02-12 — End: 2020-02-22

## 2020-02-12 MED ORDER — DICYCLOMINE HCL 10 MG PO CAPS
10.0000 mg | ORAL_CAPSULE | Freq: Three times a day (TID) | ORAL | Status: DC | PRN
  Filled 2020-02-12: qty 1

## 2020-02-12 MED ORDER — POLYETHYLENE GLYCOL 3350 17 G PO PACK
17.0000 g | PACK | Freq: Every day | ORAL | Status: DC
  Administered 2020-02-12: 17 g via ORAL
  Filled 2020-02-12 (×2): qty 1

## 2020-02-12 MED ORDER — MENTHOL 3 MG MT LOZG
1.0000 | LOZENGE | OROMUCOSAL | Status: DC | PRN
  Administered 2020-02-12: 3 mg via ORAL
  Filled 2020-02-12: qty 9

## 2020-02-12 MED ORDER — LORAZEPAM 2 MG PO TABS
0.0000 mg | ORAL_TABLET | Freq: Four times a day (QID) | ORAL | Status: DC
  Administered 2020-02-12: 2 mg via ORAL
  Administered 2020-02-13: 1 mg via ORAL
  Filled 2020-02-12 (×2): qty 1

## 2020-02-12 MED ORDER — LORAZEPAM 2 MG PO TABS: 0.0000 mg | ORAL_TABLET | Freq: Two times a day (BID) | ORAL | Status: DC

## 2020-02-12 MED ORDER — LORAZEPAM 2 MG/ML IJ SOLN: 0.0000 mg | Freq: Four times a day (QID) | INTRAMUSCULAR | Status: DC

## 2020-02-12 NOTE — ED Notes (Signed)
Pt requesting new scrub pants. Clean ones given. Pt given blanket. Pt up to bathroom. Calm, cooperative and anxious at this time. Oriented to unit.
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
## 2020-02-12 NOTE — ED Notes (Signed)
Hourly rounding reveals patient sleeping in room. No complaints, stable, in no acute distress. Q15 minute rounds and monitoring via Security Cameras to continue. 

## 2020-02-12 NOTE — Discharge Instructions (Addendum)
Please take your doxycycline 1 twice a day as prescribed.  You can also follow-up with [facility] health care or the public health clinic.  You can take it with food which should help protect your stomach.  Use Tylenol as needed for pain.  Please follow-up with [facility].  They have walk-in hours as well.  Their information is on the other sheet of paper that we gave you.  Please feel free to return anytime you feel you need to.

## 2020-02-12 NOTE — ED Notes (Signed)
MD malinda gave verbal order for zofran, cepacol, and tylenol for patients complaints. MD Darnelle Catalan discussed with patient completing another vaginal exam. When going to complete exam, patient told tech she wanted to leave and no longer wanted exam. MD made aware and typed up discharge papers. Patient given clothes and belongings

## 2020-02-12 NOTE — ED Notes (Signed)
Report to include Situation, Background, Assessment, and Recommendations received from Jessica RN. Patient alert and oriented, warm and dry, in no acute distress. Patient denies SI, HI, AVH and pain. Patient made aware of Q15 minute rounds and security cameras for their safety. Patient instructed to come to me with needs or concerns. 
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
## 2020-02-12 NOTE — ED Provider Notes (Signed)
Patient has been seen by psychiatry and felt not to be a candidate for involuntary commitment.  Patient complains of sore throat and asked me to check.  Her throat looks good I do not feel any palpable nodes.  Patient complains of burning in her vagina.  She just had a pelvic exam but asked me to check her again.  I go out to arrange with the nurse to recheck her.  Patient now says she wants to go home.  She does not want to wait for the pelvic exam she did get some Tylenol for her aches and pains which she asked for as well.   Arnaldo Natal, MD 02/12/20 [phone]

## 2020-02-12 NOTE — ED Notes (Signed)
Patient requesting burgundy scrub pants. Patient made aware we do not have any burgundy scrub pants in her size. Patient given another maternity pad per request

## 2020-02-12 NOTE — ED Notes (Signed)
Patient now IVC
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
## 2020-02-12 NOTE — ED Notes (Signed)
First Nurse Note: Pt here via EMS for depression and substance abuse issues. Pt is in NAD.  Pt last snorted oxycodone this morning.

## 2020-02-12 NOTE — ED Notes (Signed)
Patient relaying "vaginal burning and throat discomfort." MD Malinda made aware.

## 2020-02-12 NOTE — ED Triage Notes (Signed)
1 gray with clear stones, 1 yellow shiny ring with clear stones, 1 pair flip flops, 1 tied up grocery bag with medications, cigarettes, and cell phone, 1 pink hair tie, 1 tie dye dress, 1 pair underwear, 1 bra.   Pt changed into blue paper scrubs by this RN and Vikki Ports, Charity fundraiser.
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
## 2020-02-12 NOTE — ED Notes (Signed)
Pt asking to leave. Dr Don Perking informed.

## 2020-02-12 NOTE — ED Notes (Signed)
Patient requesting doxycycline for her PID diagnosis. MD goodman made aware and placed orders

## 2020-02-12 NOTE — ED Notes (Signed)
Patient given lunch tray and water.  

## 2020-02-13 ENCOUNTER — Inpatient Hospital Stay: Admission: RE | Admit: 2020-02-13 | Payer: Medicaid Other | Source: Ambulatory Visit

## 2020-02-13 DIAGNOSIS — F329 Major depressive disorder, single episode, unspecified: Secondary | ICD-10-CM

## 2020-02-13 DIAGNOSIS — F32A Depression, unspecified: Secondary | ICD-10-CM | POA: Insufficient documentation

## 2020-02-13 NOTE — ED Notes (Signed)
Hourly rounding reveals patient sleeping in room. No complaints, stable, in no acute distress. Q15 minute rounds and monitoring via Security Cameras to continue. 

## 2020-02-13 NOTE — ED Notes (Signed)
Rescinded by Reola Calkins NP
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
## 2020-02-13 NOTE — ED Notes (Signed)
Feliz Beam NP talked with Patient, Patient remains calm and cooperative.

## 2020-02-13 NOTE — ED Provider Notes (Addendum)
Patient cleared by psychiatry service.  Discharged in stable condition.  Strict return precautions provided in writing.    Gilles Chiquito, MD 02/13/20 1616    Gilles Chiquito, MD 02/13/20 [phone]

## 2020-02-13 NOTE — ED Notes (Signed)
Nurse talked with patient and she states that she is upset about her addiction to pills and that her Dad and Mom are both on drugs and they don't care about her, she keeps asking her Dad does He love her and she states that she does not feel loved, she started crying and Nurse talked to her about self validation, and the effects of addiction on people, Patient listened and then states " I hope I can be admitted and go downstairs. Staff will continue to monitor, camera surveillance in progress for safety. Patient denies Hi or avh, but states she feels Si without a plan.

## 2020-02-13 NOTE — ED Provider Notes (Signed)
Emergency Medicine Observation Re-evaluation Note  Katherine Burgess is a 39 y.o. female, seen on rounds today.  Pt initially presented to the ED for complaints of Psychiatric Evaluation Currently, the patient is awake, alert, in no acute distress.  Physical Exam  BP (!) 99/55 (BP Location: Left Arm)   Pulse 61   Temp 98.5 F (36.9 C) (Oral)   Resp 14   Ht 5' (1.524 m)   Wt 59 kg   LMP 01/27/2020   SpO2 93%   BMI 25.39 kg/m  Physical Exam Vitals and nursing note reviewed.  HENT:     Head: Normocephalic and atraumatic.     Right Ear: External ear normal.     Left Ear: External ear normal.     Nose: Nose normal.  Cardiovascular:     Rate and Rhythm: Normal rate.  Abdominal:     General: There is no distension.  Neurological:     General: No focal deficit present.     ED Course / MDM  EKG:    I have reviewed the labs performed to date as well as medications administered while in observation.  Recent changes in the last 24 hours include none. Plan  Current plan is for IVC for admission. Patient is under full IVC at this time.   Gilles Chiquito, MD 02/13/20 1048

## 2020-02-13 NOTE — ED Notes (Signed)
Patient voices understanding of discharge instructions, no signs of distress, states that he Dad is going to pick her up and that she will stay with him, all of her belongings were given back to her, she is pleasant and cooperative.

## 2020-02-16 ENCOUNTER — Ambulatory Visit (HOSPITAL_COMMUNITY): Payer: Medicaid Other | Admitting: Licensed Clinical Social Worker

## 2020-03-14 ENCOUNTER — Ambulatory Visit (HOSPITAL_COMMUNITY): Payer: Medicaid Other | Admitting: Psychiatry

## 2020-04-03 ENCOUNTER — Ambulatory Visit
Admission: EM | Admit: 2020-04-03 | Discharge: 2020-04-03 | Disposition: A | Discharge status: Home / Self Care | Payer: Medicaid Other | Attending: Family Medicine | Admitting: Family Medicine

## 2020-04-03 ENCOUNTER — Encounter: Payer: Self-pay | Admitting: Emergency Medicine

## 2020-04-03 ENCOUNTER — Other Ambulatory Visit: Payer: Self-pay

## 2020-04-03 DIAGNOSIS — Z202 Contact with and (suspected) exposure to infections with a predominantly sexual mode of transmission: Secondary | ICD-10-CM | POA: Insufficient documentation

## 2020-04-03 DIAGNOSIS — N941 Unspecified dyspareunia: Secondary | ICD-10-CM | POA: Diagnosis not present

## 2020-04-03 DIAGNOSIS — Z113 Encounter for screening for infections with a predominantly sexual mode of transmission: Secondary | ICD-10-CM | POA: Diagnosis not present

## 2020-04-03 LAB — POCT URINALYSIS DIP (MANUAL ENTRY)
Bilirubin, UA: NEGATIVE
Blood, UA: NEGATIVE
Glucose, UA: NEGATIVE mg/dL
Ketones, POC UA: NEGATIVE mg/dL
Leukocytes, UA: NEGATIVE
Nitrite, UA: NEGATIVE
Protein Ur, POC: NEGATIVE mg/dL
Spec Grav, UA: 1.02 (ref 1.010–1.025)
Urobilinogen, UA: 0.2 E.U./dL
pH, UA: 6.5 (ref 5.0–8.0)

## 2020-04-03 MED ORDER — DOXYCYCLINE HYCLATE 100 MG PO CAPS
100.0000 mg | ORAL_CAPSULE | Freq: Two times a day (BID) | ORAL | 0 refills | Status: DC
Start: 2020-04-03 — End: 2021-01-02

## 2020-04-03 MED ORDER — AZITHROMYCIN 500 MG PO TABS: 1000.0000 mg | ORAL_TABLET | Freq: Every day | ORAL | 0 refills | Status: AC

## 2020-04-03 MED ORDER — AZITHROMYCIN 500 MG PO TABS: 1000.0000 mg | ORAL_TABLET | Freq: Once | ORAL | Status: DC

## 2020-04-03 MED ORDER — CEFTRIAXONE SODIUM 500 MG IJ SOLR
500.0000 mg | Freq: Once | INTRAMUSCULAR | Status: AC
  Administered 2020-04-03: 500 mg via INTRAMUSCULAR

## 2020-04-03 NOTE — ED Triage Notes (Signed)
Lower abd pressure , vaginal pain during intercourse and increased urinary frequency. Would like to be checked for std.

## 2020-04-03 NOTE — Discharge Instructions (Signed)
You were treated with an antibiotic today called Rocephin.  Take azithromycin 1000mg  once and doxycycline twice a day for 7 days.    Do not have sex for 7 days.  Syphilis and HIV testing should be back tomorrow  Your vaginal tests are pending.  If your test results are positive, we will call you.  You may need additional treatment and your partner(s) may also need treatment.
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
## 2020-04-04 LAB — CERVICOVAGINAL ANCILLARY ONLY
Bacterial Vaginitis (gardnerella): POSITIVE — AB
Candida Glabrata: NEGATIVE
Candida Vaginitis: NEGATIVE
Chlamydia: NEGATIVE
Comment: NEGATIVE
Comment: NEGATIVE
Comment: NEGATIVE
Comment: NEGATIVE
Comment: NEGATIVE
Comment: NORMAL
Neisseria Gonorrhea: NEGATIVE
Trichomonas: POSITIVE — AB

## 2020-04-04 LAB — URINE CULTURE: Culture: <10000 — AB

## 2020-04-04 LAB — HIV ANTIBODY (ROUTINE TESTING W REFLEX): HIV Screen 4th Generation wRfx: NONREACTIVE

## 2020-04-04 LAB — RPR: RPR Ser Ql: NONREACTIVE

## 2020-04-05 ENCOUNTER — Telehealth (HOSPITAL_COMMUNITY): Payer: Self-pay | Admitting: Emergency Medicine

## 2020-04-05 MED ORDER — CLINDAMYCIN HCL 150 MG PO CAPS
300.0000 mg | ORAL_CAPSULE | Freq: Two times a day (BID) | ORAL | 0 refills | Status: DC
Start: 2020-04-05 — End: 2021-01-02

## 2020-04-05 MED ORDER — CLINDAMYCIN PHOSPHATE 2 % VA CREA: 1.0000 | TOPICAL_CREAM | Freq: Every day | VAGINAL | 0 refills | Status: DC

## 2020-04-05 NOTE — Telephone Encounter (Signed)
Called patient back to find out reaction to Flagyl.  Patient states true anaphylaxis with throat swelling.  Unable to prescribe either treatment for Trichomonas at this time.  Spoke to provider team, who encouraged patient to follow-up with OB/Gyn and/or Infectious Disease.  Patient made aware.  Patient states "my body has changed a lot recently and I'm willing to try it again".  This RN explained the risk associated with taking a medication again known to cause anaphylaxis, and told her we would not be able to prescribe anything for her at this time.  Patient verbalized understanding.  Clindamycin sent for BV.

## 2020-04-05 NOTE — Telephone Encounter (Signed)
Patient preferred CLindamycin pill, changing order
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
## 2020-06-01 ENCOUNTER — Ambulatory Visit: Payer: Medicaid Other | Admitting: Neurology

## 2020-10-19 ENCOUNTER — Emergency Department (HOSPITAL_COMMUNITY)
Admission: EM | Admit: 2020-10-19 | Discharge: 2020-10-20 | Disposition: A | Discharge status: Home / Self Care | Payer: Medicaid Other | Attending: Emergency Medicine | Admitting: Emergency Medicine

## 2020-10-19 ENCOUNTER — Emergency Department (HOSPITAL_COMMUNITY): Payer: Medicaid Other

## 2020-10-19 ENCOUNTER — Other Ambulatory Visit: Payer: Self-pay

## 2020-10-19 ENCOUNTER — Encounter (HOSPITAL_COMMUNITY): Payer: Self-pay | Admitting: Emergency Medicine

## 2020-10-19 DIAGNOSIS — R079 Chest pain, unspecified: Secondary | ICD-10-CM | POA: Insufficient documentation

## 2020-10-19 DIAGNOSIS — F17210 Nicotine dependence, cigarettes, uncomplicated: Secondary | ICD-10-CM | POA: Diagnosis not present

## 2020-10-19 DIAGNOSIS — R110 Nausea: Secondary | ICD-10-CM | POA: Insufficient documentation

## 2020-10-19 DIAGNOSIS — F419 Anxiety disorder, unspecified: Secondary | ICD-10-CM | POA: Insufficient documentation

## 2020-10-19 DIAGNOSIS — R42 Dizziness and giddiness: Secondary | ICD-10-CM | POA: Insufficient documentation

## 2020-10-19 DIAGNOSIS — Z91040 Latex allergy status: Secondary | ICD-10-CM | POA: Insufficient documentation

## 2020-10-19 DIAGNOSIS — R0600 Dyspnea, unspecified: Secondary | ICD-10-CM | POA: Insufficient documentation

## 2020-10-19 DIAGNOSIS — Z5900 Homelessness unspecified: Secondary | ICD-10-CM | POA: Insufficient documentation

## 2020-10-19 DIAGNOSIS — Z91010 Allergy to peanuts: Secondary | ICD-10-CM | POA: Diagnosis not present

## 2020-10-19 DIAGNOSIS — J45909 Unspecified asthma, uncomplicated: Secondary | ICD-10-CM | POA: Diagnosis not present

## 2020-10-19 LAB — BASIC METABOLIC PANEL
Anion gap: 11 (ref 5–15)
BUN: 10 mg/dL (ref 6–20)
CO2: 20 mmol/L — ABNORMAL LOW (ref 22–32)
Calcium: 9.5 mg/dL (ref 8.9–10.3)
Chloride: 104 mmol/L (ref 98–111)
Creatinine, Ser: 0.77 mg/dL (ref 0.44–1.00)
GFR, Estimated: >60 mL/min (ref >60)
Glucose, Bld: 119 mg/dL — ABNORMAL HIGH (ref 70–99)
Potassium: 3.3 mmol/L — ABNORMAL LOW (ref 3.5–5.1)
Sodium: 135 mmol/L (ref 135–145)

## 2020-10-19 LAB — CBC
HCT: 44.4 % (ref 36.0–46.0)
Hemoglobin: 14.8 g/dL (ref 12.0–15.0)
MCH: 32.7 pg (ref 26.0–34.0)
MCHC: 33.3 g/dL (ref 30.0–36.0)
MCV: 98 fL (ref 80.0–100.0)
Platelets: 385 10*3/uL (ref 150–400)
RBC: 4.53 MIL/uL (ref 3.87–5.11)
RDW: 12.9 % (ref 11.5–15.5)
WBC: 8.1 10*3/uL (ref 4.0–10.5)
nRBC: 0 % (ref 0.0–0.2)

## 2020-10-19 LAB — TROPONIN I (HIGH SENSITIVITY)
Troponin I (High Sensitivity): <2 ng/L (ref <18)
Troponin I (High Sensitivity): <2 ng/L (ref <18)

## 2020-10-19 MED ORDER — ASPIRIN 81 MG PO CHEW
324.0000 mg | CHEWABLE_TABLET | Freq: Once | ORAL | Status: AC
  Administered 2020-10-19: 324 mg via ORAL
  Filled 2020-10-19: qty 4

## 2020-10-19 NOTE — ED Notes (Signed)
MSE complete during triage by Abby, PA. No waiver needed

## 2020-10-19 NOTE — ED Notes (Signed)
Pt states that she is having really bad anxiety which is causing her to have chest pain. Pt states that she has no where to go tonight and that she is scared to leave. Pt vitals are WDL. Will continue to monitor pt.

## 2020-10-19 NOTE — ED Triage Notes (Signed)
Pt states that she started having chest pain after she smoked crack about 45 minutes ago. Pain radiates to left arm.

## 2020-10-19 NOTE — ED Provider Notes (Signed)
MSE was initiated and I personally evaluated the patient and placed orders (if any) at  8:10 PM on October 19, 2020.  Emergency Medicine Provider Triage Evaluation Note  Katherine Burgess , a 40 y.o. female  was evaluated in triage.  Pt complains of sever chest pain radiating to the left after arm after smoking Crack about 45 min PTA. deneis sob.  Review of Systems  Positive: cp Negative: sob  Physical Exam  There were no vitals taken for this visit. Gen:   Awake, no distress   HEENT:  Atraumatic  Resp:  Normal effort  Cardiac:  Normal rate  Abd:   Nondistended, nontender  MSK:   Moves extremities without difficulty  Neuro:  Speech clear   Medical Decision Making  Medically screening exam initiated at 8:10 PM.  Appropriate orders placed.  Katherine Burgess was informed that the remainder of the evaluation will be completed by another provider, this initial triage assessment does not replace that evaluation, and the importance of remaining in the ED until their evaluation is complete.  Clinical Impression  Chest pain EKG, labs imaging ordered.    Arthor Captain, PA-C 10/19/20 2013    Derwood Kaplan, MD 10/22/20 [phone]
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
## 2021-01-02 ENCOUNTER — Emergency Department (HOSPITAL_COMMUNITY): Payer: Medicaid Other

## 2021-01-02 ENCOUNTER — Encounter (HOSPITAL_COMMUNITY): Payer: Self-pay

## 2021-01-02 ENCOUNTER — Encounter (HOSPITAL_COMMUNITY): Payer: Self-pay | Admitting: Emergency Medicine

## 2021-01-02 ENCOUNTER — Other Ambulatory Visit: Payer: Self-pay

## 2021-01-02 ENCOUNTER — Emergency Department (HOSPITAL_COMMUNITY)
Admission: EM | Admit: 2021-01-02 | Discharge: 2021-01-02 | Disposition: A | Discharge status: Home / Self Care | Payer: Medicaid Other | Attending: Emergency Medicine | Admitting: Emergency Medicine

## 2021-01-02 ENCOUNTER — Emergency Department (HOSPITAL_COMMUNITY)
Admission: EM | Admit: 2021-01-02 | Discharge: 2021-01-02 | Disposition: A | Discharge status: Home / Self Care | Payer: Medicaid Other | Source: Home / Self Care | Attending: Emergency Medicine | Admitting: Emergency Medicine

## 2021-01-02 DIAGNOSIS — R0602 Shortness of breath: Secondary | ICD-10-CM | POA: Insufficient documentation

## 2021-01-02 DIAGNOSIS — Z91010 Allergy to peanuts: Secondary | ICD-10-CM | POA: Insufficient documentation

## 2021-01-02 DIAGNOSIS — J45909 Unspecified asthma, uncomplicated: Secondary | ICD-10-CM | POA: Insufficient documentation

## 2021-01-02 DIAGNOSIS — S060X0A Concussion without loss of consciousness, initial encounter: Secondary | ICD-10-CM | POA: Insufficient documentation

## 2021-01-02 DIAGNOSIS — M542 Cervicalgia: Secondary | ICD-10-CM | POA: Diagnosis not present

## 2021-01-02 DIAGNOSIS — R059 Cough, unspecified: Secondary | ICD-10-CM | POA: Insufficient documentation

## 2021-01-02 DIAGNOSIS — F17210 Nicotine dependence, cigarettes, uncomplicated: Secondary | ICD-10-CM | POA: Diagnosis not present

## 2021-01-02 DIAGNOSIS — R0600 Dyspnea, unspecified: Secondary | ICD-10-CM

## 2021-01-02 DIAGNOSIS — S0990XA Unspecified injury of head, initial encounter: Secondary | ICD-10-CM | POA: Diagnosis present

## 2021-01-02 MED ORDER — ALBUTEROL SULFATE HFA 108 (90 BASE) MCG/ACT IN AERS
2.0000 | INHALATION_SPRAY | Freq: Once | RESPIRATORY_TRACT | Status: AC
  Administered 2021-01-02: 2 via RESPIRATORY_TRACT
  Filled 2021-01-02: qty 6.7

## 2021-01-02 NOTE — ED Notes (Signed)
Pt yelling at this nurse demanding an inhaler giving this nurse attitude about not having her inhaler at this time.  This nurse went and got her inhaler and gave it to her.  Patient continued to address this nurse inappropriately.  This nurse instructed the patient she was discharged and free to leave.  Pt ambulated down the hall upset wth this nurse.

## 2021-01-02 NOTE — Discharge Instructions (Addendum)
Use your inhaler as needed

## 2021-01-02 NOTE — ED Notes (Signed)
Pt A&OX4, ambulatory at d/c with independent steady gait, NAD. Pt verbalized understanding of d/c instructions and follow up care. 
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
## 2021-01-02 NOTE — ED Notes (Signed)
Patient transported to CT 
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
## 2021-01-02 NOTE — Discharge Instructions (Addendum)
You were evaluated in the Emergency Department and after careful evaluation, we did not find any emergent condition requiring admission or further testing in the hospital.  Your exam/testing today was overall reassuring.  CT scans did not show any significant injuries.  Your symptoms are likely due to a concussion.  Please return to the Emergency Department if you experience any worsening of your condition.  Thank you for allowing [place] to be a part of your care.

## 2021-01-12 ENCOUNTER — Emergency Department
Admission: EM | Admit: 2021-01-12 | Discharge: 2021-01-12 | Disposition: A | Discharge status: Home / Self Care | Payer: Medicaid Other | Attending: Student in an Organized Health Care Education/Training Program | Admitting: Student in an Organized Health Care Education/Training Program

## 2021-01-12 DIAGNOSIS — Z046 Encounter for general psychiatric examination, requested by authority: Secondary | ICD-10-CM | POA: Diagnosis not present

## 2021-01-12 DIAGNOSIS — R443 Hallucinations, unspecified: Secondary | ICD-10-CM | POA: Insufficient documentation

## 2021-01-12 DIAGNOSIS — F1910 Other psychoactive substance abuse, uncomplicated: Secondary | ICD-10-CM | POA: Diagnosis not present

## 2021-01-12 DIAGNOSIS — Z91040 Latex allergy status: Secondary | ICD-10-CM | POA: Insufficient documentation

## 2021-01-12 DIAGNOSIS — F15959 Other stimulant use, unspecified with stimulant-induced psychotic disorder, unspecified: Secondary | ICD-10-CM

## 2021-01-12 DIAGNOSIS — F15159 Other stimulant abuse with stimulant-induced psychotic disorder, unspecified: Secondary | ICD-10-CM | POA: Diagnosis not present

## 2021-01-12 DIAGNOSIS — F17210 Nicotine dependence, cigarettes, uncomplicated: Secondary | ICD-10-CM | POA: Diagnosis not present

## 2021-01-12 DIAGNOSIS — R4182 Altered mental status, unspecified: Secondary | ICD-10-CM | POA: Insufficient documentation

## 2021-01-12 DIAGNOSIS — Z91010 Allergy to peanuts: Secondary | ICD-10-CM | POA: Insufficient documentation

## 2021-01-12 DIAGNOSIS — R4689 Other symptoms and signs involving appearance and behavior: Secondary | ICD-10-CM

## 2021-01-12 DIAGNOSIS — R456 Violent behavior: Secondary | ICD-10-CM | POA: Diagnosis not present

## 2021-01-12 LAB — CBC
HCT: 38 % (ref 36.0–46.0)
Hemoglobin: 12.7 g/dL (ref 12.0–15.0)
MCH: 32.7 pg (ref 26.0–34.0)
MCHC: 33.4 g/dL (ref 30.0–36.0)
MCV: 97.9 fL (ref 80.0–100.0)
Platelets: 390 10*3/uL (ref 150–400)
RBC: 3.88 MIL/uL (ref 3.87–5.11)
RDW: 13.5 % (ref 11.5–15.5)
WBC: 7.8 10*3/uL (ref 4.0–10.5)
nRBC: 0 % (ref 0.0–0.2)

## 2021-01-12 LAB — COMPREHENSIVE METABOLIC PANEL
ALT: 26 U/L (ref 0–44)
AST: 20 U/L (ref 15–41)
Albumin: 4.3 g/dL (ref 3.5–5.0)
Alkaline Phosphatase: 62 U/L (ref 38–126)
Anion gap: 10 (ref 5–15)
BUN: 15 mg/dL (ref 6–20)
CO2: 24 mmol/L (ref 22–32)
Calcium: 9.1 mg/dL (ref 8.9–10.3)
Chloride: 105 mmol/L (ref 98–111)
Creatinine, Ser: 0.67 mg/dL (ref 0.44–1.00)
GFR, Estimated: >60 mL/min (ref >60)
Glucose, Bld: 81 mg/dL (ref 70–99)
Potassium: 3.9 mmol/L (ref 3.5–5.1)
Sodium: 139 mmol/L (ref 135–145)
Total Bilirubin: 0.7 mg/dL (ref 0.3–1.2)
Total Protein: 7.8 g/dL (ref 6.5–8.1)

## 2021-01-12 LAB — ACETAMINOPHEN LEVEL: Acetaminophen (Tylenol), Serum: <10 ug/mL — ABNORMAL LOW (ref 10–30)

## 2021-01-12 LAB — URINE DRUG SCREEN, QUALITATIVE (ARMC ONLY)
Amphetamines, Ur Screen: NOT DETECTED
Barbiturates, Ur Screen: NOT DETECTED
Benzodiazepine, Ur Scrn: POSITIVE — AB
Cannabinoid 50 Ng, Ur ~~LOC~~: POSITIVE — AB
Cocaine Metabolite,Ur ~~LOC~~: POSITIVE — AB
MDMA (Ecstasy)Ur Screen: NOT DETECTED
Methadone Scn, Ur: NOT DETECTED
Opiate, Ur Screen: POSITIVE — AB
Phencyclidine (PCP) Ur S: NOT DETECTED
Tricyclic, Ur Screen: NOT DETECTED

## 2021-01-12 LAB — SALICYLATE LEVEL: Salicylate Lvl: <7 mg/dL — ABNORMAL LOW (ref 7.0–30.0)

## 2021-01-12 LAB — ETHANOL: Alcohol, Ethyl (B): <10 mg/dL (ref <10)

## 2021-01-12 NOTE — ED Notes (Signed)
Patient being discharged to home.

## 2021-01-12 NOTE — Discharge Instructions (Signed)
[facility]  Mental health service in [place], [place]  Address: [address], , [place] [address] Hours:  Closed ? Opens 8?AM Mon Phone: [phone] 
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
## 2021-01-12 NOTE — ED Notes (Signed)
Pt dressed out by this RN and Chief Executive Officer in triage.  Plaid pajama pants  black bra Underwear  Tie dye shirt 1 Ring 1 black necklace One nose ring And pt burgundy purse placed into pt belonging bag with pt label on it.

## 2021-01-12 NOTE — ED Triage Notes (Addendum)
Pt arrives via [employer] under IVC for hallucinations. Pt verbally aggressive in triage toward officer and staff. Pt admitted to using crack to officers, but denies any drug use in triage. Pt appears paranoid and delusional in triage. Denies SI/HI

## 2021-01-22 ENCOUNTER — Telehealth: Payer: Self-pay | Admitting: Emergency Medicine

## 2021-01-22 NOTE — Telephone Encounter (Signed)
Patient called and says she has been receiving phone calls over and over from [phone] with a computer message telling her something like she needs medication or needs to check her results.  I explained that I did not call her, and it do not see where anyone called her.  I advised her to call her doctor to ask about any medications that she needs and she agrees to call them.

## 2021-02-04 ENCOUNTER — Emergency Department (HOSPITAL_COMMUNITY)
Admission: EM | Admit: 2021-02-04 | Discharge: 2021-02-04 | Disposition: A | Discharge status: Home / Self Care | Payer: Medicaid Other | Attending: Emergency Medicine | Admitting: Emergency Medicine

## 2021-02-04 ENCOUNTER — Encounter (HOSPITAL_COMMUNITY): Payer: Self-pay | Admitting: *Deleted

## 2021-02-04 ENCOUNTER — Other Ambulatory Visit: Payer: Self-pay

## 2021-02-04 DIAGNOSIS — S71102A Unspecified open wound, left thigh, initial encounter: Secondary | ICD-10-CM | POA: Diagnosis not present

## 2021-02-04 DIAGNOSIS — X58XXXA Exposure to other specified factors, initial encounter: Secondary | ICD-10-CM | POA: Insufficient documentation

## 2021-02-04 DIAGNOSIS — R1084 Generalized abdominal pain: Secondary | ICD-10-CM | POA: Insufficient documentation

## 2021-02-04 DIAGNOSIS — R112 Nausea with vomiting, unspecified: Secondary | ICD-10-CM | POA: Insufficient documentation

## 2021-02-04 DIAGNOSIS — F17210 Nicotine dependence, cigarettes, uncomplicated: Secondary | ICD-10-CM | POA: Diagnosis not present

## 2021-02-04 DIAGNOSIS — Z91010 Allergy to peanuts: Secondary | ICD-10-CM | POA: Insufficient documentation

## 2021-02-04 DIAGNOSIS — Z91040 Latex allergy status: Secondary | ICD-10-CM | POA: Diagnosis not present

## 2021-02-04 DIAGNOSIS — R509 Fever, unspecified: Secondary | ICD-10-CM | POA: Insufficient documentation

## 2021-02-04 DIAGNOSIS — J45909 Unspecified asthma, uncomplicated: Secondary | ICD-10-CM | POA: Insufficient documentation

## 2021-02-04 DIAGNOSIS — L03116 Cellulitis of left lower limb: Secondary | ICD-10-CM

## 2021-02-04 MED ORDER — ACETAMINOPHEN 500 MG PO TABS
1000.0000 mg | ORAL_TABLET | Freq: Once | ORAL | Status: AC
  Administered 2021-02-04: 1000 mg via ORAL
  Filled 2021-02-04: qty 2

## 2021-02-04 MED ORDER — IBUPROFEN 800 MG PO TABS
800.0000 mg | ORAL_TABLET | Freq: Once | ORAL | Status: AC
  Administered 2021-02-04: 800 mg via ORAL
  Filled 2021-02-04: qty 1

## 2021-02-04 NOTE — ED Triage Notes (Signed)
Insect bite or abscess left thigh

## 2021-02-04 NOTE — ED Notes (Signed)
Pt getting upset stating she is ready to leave; pt states she has been here 3 hours and needs to eat; encouraged pt she should be the next patient to be seen; pt verbalized understanding

## 2021-02-04 NOTE — ED Notes (Signed)
Pt left without informing staff of intentions. Pt ambulatory upon discharge.
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
## 2021-02-05 ENCOUNTER — Ambulatory Visit
Admission: EM | Admit: 2021-02-05 | Discharge: 2021-02-05 | Disposition: A | Discharge status: Home / Self Care | Payer: Medicaid Other | Attending: Family Medicine | Admitting: Family Medicine

## 2021-02-05 DIAGNOSIS — B9689 Other specified bacterial agents as the cause of diseases classified elsewhere: Secondary | ICD-10-CM | POA: Insufficient documentation

## 2021-02-05 DIAGNOSIS — L089 Local infection of the skin and subcutaneous tissue, unspecified: Secondary | ICD-10-CM | POA: Diagnosis present

## 2021-02-05 DIAGNOSIS — Z113 Encounter for screening for infections with a predominantly sexual mode of transmission: Secondary | ICD-10-CM | POA: Insufficient documentation

## 2021-02-05 LAB — POCT URINALYSIS DIP (MANUAL ENTRY)
Bilirubin, UA: NEGATIVE
Glucose, UA: NEGATIVE mg/dL
Ketones, POC UA: NEGATIVE mg/dL
Leukocytes, UA: NEGATIVE
Nitrite, UA: NEGATIVE
Protein Ur, POC: NEGATIVE mg/dL
Spec Grav, UA: 1.01 (ref 1.010–1.025)
Urobilinogen, UA: 0.2 E.U./dL
pH, UA: 7 (ref 5.0–8.0)

## 2021-02-05 LAB — POCT URINE PREGNANCY: Preg Test, Ur: NEGATIVE

## 2021-02-05 MED ORDER — DOXYCYCLINE HYCLATE 100 MG PO CAPS: 100.0000 mg | ORAL_CAPSULE | Freq: Two times a day (BID) | ORAL | 0 refills | Status: DC

## 2021-02-05 MED ORDER — HYDROCODONE-ACETAMINOPHEN 5-325 MG PO TABS: 1.0000 | ORAL_TABLET | Freq: Four times a day (QID) | ORAL | 0 refills | Status: DC | PRN
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
## 2021-02-05 NOTE — Discharge Instructions (Addendum)

## 2021-02-05 NOTE — ED Triage Notes (Signed)
Pt presents with small abscess to inside left thigh for past 3 days, was seen in ed last night and states not treateds, also wants std screen

## 2021-02-06 LAB — CERVICOVAGINAL ANCILLARY ONLY
Bacterial Vaginitis (gardnerella): POSITIVE — AB
Candida Glabrata: NEGATIVE
Candida Vaginitis: NEGATIVE
Chlamydia: NEGATIVE
Comment: NEGATIVE
Comment: NEGATIVE
Comment: NEGATIVE
Comment: NEGATIVE
Comment: NEGATIVE
Comment: NORMAL
Neisseria Gonorrhea: NEGATIVE
Trichomonas: POSITIVE — AB

## 2021-02-06 LAB — HIV ANTIBODY (ROUTINE TESTING W REFLEX): HIV Screen 4th Generation wRfx: NONREACTIVE

## 2021-02-06 LAB — RPR: RPR Ser Ql: NONREACTIVE

## 2021-02-07 ENCOUNTER — Telehealth (HOSPITAL_COMMUNITY): Payer: Self-pay | Admitting: Internal Medicine

## 2021-02-07 NOTE — Telephone Encounter (Signed)
Patient was diagnosed with trichomonas.  She has had an Aflac cyst metronidazole in the past.  No other treatment options available.  Patient will be referred to an allergist for further management.

## 2021-02-11 ENCOUNTER — Telehealth: Payer: Self-pay | Admitting: Allergy

## 2021-02-11 NOTE — Telephone Encounter (Signed)
Please call patient back.  We will need to do a new patient evaluation to discus specific symptoms and timeline of reaction with the metronidazole.   If she really had an anaphylactic reaction to metronidazole then she may need drug desensitization which we don't do in our office and she may need to be referred [facility] to [facility].   Please ask if she wants to see if [facility] can get her in sooner OR if she wants to see [place] first.  I can see her in OR on 8/16 at 3:15PM - block out 30 minutes.  Thank you.

## 2021-02-11 NOTE — Telephone Encounter (Addendum)
Referral was received from Dr. Demaris Callander due to reaction to antibiotic to treat STD.   Patient states her reaction was a rash and nauseous. Per Dr. Leonides Grills patient has an allergy to metronidazole-anaphylaxis.  "Patient was diagnosed with trichomonas.  She has had an Aflac cyst metronidazole in the past.  No other treatment options available.  Patient will be referred to an allergist for further management."- Dr. Demaris Callander  Please advise if patient can be overbooked or if she can wait until 8/16 with Dr. Dellis Anes.
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
## 2021-02-26 ENCOUNTER — Ambulatory Visit: Payer: Self-pay | Admitting: Allergy

## 2021-03-21 ENCOUNTER — Emergency Department (HOSPITAL_COMMUNITY)
Admission: EM | Admit: 2021-03-21 | Discharge: 2021-03-22 | Disposition: A | Discharge status: Home / Self Care | Payer: Medicaid Other | Attending: Emergency Medicine | Admitting: Emergency Medicine

## 2021-03-21 ENCOUNTER — Encounter (HOSPITAL_COMMUNITY): Payer: Self-pay

## 2021-03-21 ENCOUNTER — Other Ambulatory Visit: Payer: Self-pay

## 2021-03-21 ENCOUNTER — Emergency Department (HOSPITAL_COMMUNITY): Payer: Medicaid Other

## 2021-03-21 DIAGNOSIS — Z79899 Other long term (current) drug therapy: Secondary | ICD-10-CM | POA: Insufficient documentation

## 2021-03-21 DIAGNOSIS — F17210 Nicotine dependence, cigarettes, uncomplicated: Secondary | ICD-10-CM | POA: Insufficient documentation

## 2021-03-21 DIAGNOSIS — R0602 Shortness of breath: Secondary | ICD-10-CM | POA: Diagnosis present

## 2021-03-21 DIAGNOSIS — J45909 Unspecified asthma, uncomplicated: Secondary | ICD-10-CM | POA: Diagnosis not present

## 2021-03-21 DIAGNOSIS — Z91010 Allergy to peanuts: Secondary | ICD-10-CM | POA: Insufficient documentation

## 2021-03-21 DIAGNOSIS — R079 Chest pain, unspecified: Secondary | ICD-10-CM | POA: Diagnosis not present

## 2021-03-21 DIAGNOSIS — R519 Headache, unspecified: Secondary | ICD-10-CM | POA: Diagnosis not present

## 2021-03-21 DIAGNOSIS — Z91040 Latex allergy status: Secondary | ICD-10-CM | POA: Diagnosis not present

## 2021-03-21 DIAGNOSIS — Z20822 Contact with and (suspected) exposure to covid-19: Secondary | ICD-10-CM | POA: Diagnosis not present

## 2021-03-21 LAB — BASIC METABOLIC PANEL
Anion gap: 6 (ref 5–15)
BUN: 8 mg/dL (ref 6–20)
CO2: 25 mmol/L (ref 22–32)
Calcium: 8.6 mg/dL — ABNORMAL LOW (ref 8.9–10.3)
Chloride: 106 mmol/L (ref 98–111)
Creatinine, Ser: 0.86 mg/dL (ref 0.44–1.00)
GFR, Estimated: >60 mL/min (ref >60)
Glucose, Bld: 86 mg/dL (ref 70–99)
Potassium: 3.9 mmol/L (ref 3.5–5.1)
Sodium: 137 mmol/L (ref 135–145)

## 2021-03-21 LAB — RAPID URINE DRUG SCREEN, HOSP PERFORMED
Amphetamines: NOT DETECTED
Barbiturates: NOT DETECTED
Benzodiazepines: POSITIVE — AB
Cocaine: POSITIVE — AB
Opiates: NOT DETECTED
Tetrahydrocannabinol: POSITIVE — AB

## 2021-03-21 LAB — CBC WITH DIFFERENTIAL/PLATELET
Abs Immature Granulocytes: 0.02 10*3/uL (ref 0.00–0.07)
Basophils Absolute: 0 10*3/uL (ref 0.0–0.1)
Basophils Relative: 0 %
Eosinophils Absolute: 0.1 10*3/uL (ref 0.0–0.5)
Eosinophils Relative: 1 %
HCT: 39 % (ref 36.0–46.0)
Hemoglobin: 12.9 g/dL (ref 12.0–15.0)
Immature Granulocytes: 0 %
Lymphocytes Relative: 48 %
Lymphs Abs: 3.3 10*3/uL (ref 0.7–4.0)
MCH: 33.2 pg (ref 26.0–34.0)
MCHC: 33.1 g/dL (ref 30.0–36.0)
MCV: 100.3 fL — ABNORMAL HIGH (ref 80.0–100.0)
Monocytes Absolute: 0.4 10*3/uL (ref 0.1–1.0)
Monocytes Relative: 6 %
Neutro Abs: 3.1 10*3/uL (ref 1.7–7.7)
Neutrophils Relative %: 45 %
Platelets: 352 10*3/uL (ref 150–400)
RBC: 3.89 MIL/uL (ref 3.87–5.11)
RDW: 12.8 % (ref 11.5–15.5)
WBC: 6.9 10*3/uL (ref 4.0–10.5)
nRBC: 0 % (ref 0.0–0.2)

## 2021-03-21 LAB — RESP PANEL BY RT-PCR (FLU A&B, COVID) ARPGX2
Influenza A by PCR: NEGATIVE
Influenza B by PCR: NEGATIVE
SARS Coronavirus 2 by RT PCR: NEGATIVE

## 2021-03-21 LAB — URINALYSIS, ROUTINE W REFLEX MICROSCOPIC
Bilirubin Urine: NEGATIVE
Glucose, UA: NEGATIVE mg/dL
Hgb urine dipstick: NEGATIVE
Ketones, ur: NEGATIVE mg/dL
Leukocytes,Ua: NEGATIVE
Nitrite: NEGATIVE
Protein, ur: NEGATIVE mg/dL
Specific Gravity, Urine: <1.005 — ABNORMAL LOW (ref 1.005–1.030)
pH: 6.5 (ref 5.0–8.0)

## 2021-03-21 LAB — D-DIMER, QUANTITATIVE: D-Dimer, Quant: 0.28 ug/mL-FEU (ref 0.00–0.50)

## 2021-03-21 LAB — PREGNANCY, URINE: Preg Test, Ur: NEGATIVE

## 2021-03-21 LAB — TROPONIN I (HIGH SENSITIVITY): Troponin I (High Sensitivity): <2 ng/L (ref <18)

## 2021-03-21 MED ORDER — ALBUTEROL SULFATE HFA 108 (90 BASE) MCG/ACT IN AERS
4.0000 | INHALATION_SPRAY | Freq: Once | RESPIRATORY_TRACT | Status: AC
  Administered 2021-03-22: 4 via RESPIRATORY_TRACT
  Filled 2021-03-21: qty 6.7

## 2021-03-21 MED ORDER — DIPHENHYDRAMINE HCL 50 MG/ML IJ SOLN
25.0000 mg | Freq: Once | INTRAMUSCULAR | Status: AC
  Administered 2021-03-21: 25 mg via INTRAVENOUS
  Filled 2021-03-21: qty 1

## 2021-03-21 MED ORDER — METOCLOPRAMIDE HCL 5 MG/ML IJ SOLN
10.0000 mg | Freq: Once | INTRAMUSCULAR | Status: AC
  Administered 2021-03-21: 10 mg via INTRAVENOUS
  Filled 2021-03-21: qty 2

## 2021-03-21 MED ORDER — KETOROLAC TROMETHAMINE 30 MG/ML IJ SOLN
30.0000 mg | Freq: Once | INTRAMUSCULAR | Status: AC
  Administered 2021-03-21: 30 mg via INTRAVENOUS
  Filled 2021-03-21: qty 1
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
## 2021-03-21 NOTE — ED Triage Notes (Addendum)
Pt says she started having sob a couple of hours ago, pt used inhaler with no relief, pt able to speak full sentences, oxygen in triage is 100% on room air. Pt also reports migraine for 2 or 3 days. When asked if pt took anything for pain, pt says no, "doesn't know why"

## 2021-03-21 NOTE — Discharge Instructions (Addendum)
Use the albuterol inhaler, 1 to 2 puffs every 4-6 hours as needed.  Call Dr. Letitia Neri office to arrange a follow-up appointment.    Return to the emergency department if you develop any new or worsening symptoms

## 2021-03-22 NOTE — ED Notes (Signed)
Pt asleep and denies pain

## 2021-06-16 ENCOUNTER — Inpatient Hospital Stay (HOSPITAL_COMMUNITY): Payer: Medicaid Other

## 2021-06-16 ENCOUNTER — Inpatient Hospital Stay (HOSPITAL_COMMUNITY)
Admission: AD | Admit: 2021-06-16 | Discharge: 2021-06-16 | Disposition: A | Discharge status: Home / Self Care | Payer: Medicaid Other | Attending: Emergency Medicine | Admitting: Emergency Medicine

## 2021-06-16 ENCOUNTER — Ambulatory Visit (HOSPITAL_COMMUNITY)
Admission: EM | Admit: 2021-06-16 | Discharge: 2021-06-16 | Disposition: A | Discharge status: Home / Self Care | Payer: No Typology Code available for payment source | Source: Ambulatory Visit | Attending: Emergency Medicine | Admitting: Emergency Medicine

## 2021-06-16 ENCOUNTER — Other Ambulatory Visit: Payer: Self-pay

## 2021-06-16 DIAGNOSIS — T7421XA Adult sexual abuse, confirmed, initial encounter: Secondary | ICD-10-CM | POA: Insufficient documentation

## 2021-06-16 DIAGNOSIS — Z0441 Encounter for examination and observation following alleged adult rape: Secondary | ICD-10-CM | POA: Diagnosis present

## 2021-06-16 DIAGNOSIS — T7621XA Adult sexual abuse, suspected, initial encounter: Secondary | ICD-10-CM

## 2021-06-16 DIAGNOSIS — Z3202 Encounter for pregnancy test, result negative: Secondary | ICD-10-CM | POA: Diagnosis not present

## 2021-06-16 LAB — POCT PREGNANCY, URINE: Preg Test, Ur: NEGATIVE

## 2021-06-16 MED ORDER — DOXYCYCLINE HYCLATE 100 MG PO CAPS: 100.0000 mg | ORAL_CAPSULE | Freq: Two times a day (BID) | ORAL | 0 refills | Status: DC

## 2021-06-16 MED ORDER — LIDOCAINE HCL (PF) 1 % IJ SOLN
INTRAMUSCULAR | Status: AC
  Administered 2021-06-16: 18:00:00 0.5 mL via INTRAMUSCULAR
  Filled 2021-06-16: qty 5

## 2021-06-16 MED ORDER — DOXYCYCLINE HYCLATE 100 MG PO CAPS: 100.0000 mg | ORAL_CAPSULE | Freq: Two times a day (BID) | ORAL | 0 refills | Status: AC

## 2021-06-16 MED ORDER — ULIPRISTAL ACETATE 30 MG PO TABS
30.0000 mg | ORAL_TABLET | Freq: Once | ORAL | Status: AC
  Administered 2021-06-16: 18:00:00 30 mg via ORAL
  Filled 2021-06-16: qty 1

## 2021-06-16 MED ORDER — CEFTRIAXONE SODIUM 500 MG IJ SOLR
500.0000 mg | Freq: Once | INTRAMUSCULAR | Status: AC
  Administered 2021-06-16: 18:00:00 500 mg via INTRAMUSCULAR
  Filled 2021-06-16: qty 500
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
## 2021-06-16 NOTE — ED Notes (Signed)
SANE RN here to speak with patient regarding kit collection
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
## 2021-06-16 NOTE — SANE Note (Signed)
At approximately 6:30 pm the SANE/FNE Teacher, music) consult has been completed. The primary provider has been notified. Please contact the SANE/FNE nurse on call (listed in Amion) with any further concerns.
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
## 2021-10-23 ENCOUNTER — Other Ambulatory Visit: Payer: Self-pay

## 2021-10-23 ENCOUNTER — Observation Stay
Admission: EM | Admit: 2021-10-23 | Discharge: 2021-10-24 | Disposition: A | Discharge status: Home / Self Care | Payer: Medicaid Other | Attending: Internal Medicine | Admitting: Internal Medicine

## 2021-10-23 ENCOUNTER — Emergency Department (HOSPITAL_COMMUNITY)
Admission: EM | Admit: 2021-10-23 | Discharge: 2021-10-23 | Discharge status: Against Medical Advice | Payer: Medicaid Other | Source: Home / Self Care

## 2021-10-23 ENCOUNTER — Emergency Department: Payer: Medicaid Other

## 2021-10-23 DIAGNOSIS — Z91010 Allergy to peanuts: Secondary | ICD-10-CM | POA: Diagnosis not present

## 2021-10-23 DIAGNOSIS — F17210 Nicotine dependence, cigarettes, uncomplicated: Secondary | ICD-10-CM | POA: Diagnosis not present

## 2021-10-23 DIAGNOSIS — Z20822 Contact with and (suspected) exposure to covid-19: Secondary | ICD-10-CM | POA: Insufficient documentation

## 2021-10-23 DIAGNOSIS — I214 Non-ST elevation (NSTEMI) myocardial infarction: Principal | ICD-10-CM | POA: Diagnosis present

## 2021-10-23 DIAGNOSIS — R45851 Suicidal ideations: Secondary | ICD-10-CM | POA: Insufficient documentation

## 2021-10-23 DIAGNOSIS — R0602 Shortness of breath: Secondary | ICD-10-CM | POA: Diagnosis not present

## 2021-10-23 DIAGNOSIS — J45909 Unspecified asthma, uncomplicated: Secondary | ICD-10-CM | POA: Insufficient documentation

## 2021-10-23 DIAGNOSIS — F1910 Other psychoactive substance abuse, uncomplicated: Secondary | ICD-10-CM | POA: Diagnosis not present

## 2021-10-23 DIAGNOSIS — I248 Other forms of acute ischemic heart disease: Secondary | ICD-10-CM | POA: Diagnosis not present

## 2021-10-23 DIAGNOSIS — Z79899 Other long term (current) drug therapy: Secondary | ICD-10-CM | POA: Diagnosis not present

## 2021-10-23 DIAGNOSIS — Z91040 Latex allergy status: Secondary | ICD-10-CM | POA: Diagnosis not present

## 2021-10-23 DIAGNOSIS — Z720 Tobacco use: Secondary | ICD-10-CM | POA: Diagnosis not present

## 2021-10-23 DIAGNOSIS — R7989 Other specified abnormal findings of blood chemistry: Secondary | ICD-10-CM

## 2021-10-23 DIAGNOSIS — R778 Other specified abnormalities of plasma proteins: Secondary | ICD-10-CM | POA: Diagnosis not present

## 2021-10-23 DIAGNOSIS — R0789 Other chest pain: Secondary | ICD-10-CM | POA: Diagnosis present

## 2021-10-23 HISTORY — DX: Other psychoactive substance abuse, uncomplicated: F19.10

## 2021-10-23 LAB — CBC WITH DIFFERENTIAL/PLATELET
Abs Immature Granulocytes: 0.02 10*3/uL (ref 0.00–0.07)
Basophils Absolute: 0 10*3/uL (ref 0.0–0.1)
Basophils Relative: 0 %
Eosinophils Absolute: 0 10*3/uL (ref 0.0–0.5)
Eosinophils Relative: 0 %
HCT: 40.5 % (ref 36.0–46.0)
Hemoglobin: 13.6 g/dL (ref 12.0–15.0)
Immature Granulocytes: 0 %
Lymphocytes Relative: 25 %
Lymphs Abs: 2.2 10*3/uL (ref 0.7–4.0)
MCH: 33.1 pg (ref 26.0–34.0)
MCHC: 33.6 g/dL (ref 30.0–36.0)
MCV: 98.5 fL (ref 80.0–100.0)
Monocytes Absolute: 0.5 10*3/uL (ref 0.1–1.0)
Monocytes Relative: 5 %
Neutro Abs: 6 10*3/uL (ref 1.7–7.7)
Neutrophils Relative %: 70 %
Platelets: 421 10*3/uL — ABNORMAL HIGH (ref 150–400)
RBC: 4.11 MIL/uL (ref 3.87–5.11)
RDW: 13.3 % (ref 11.5–15.5)
WBC: 8.7 10*3/uL (ref 4.0–10.5)
nRBC: 0 % (ref 0.0–0.2)

## 2021-10-23 LAB — COMPREHENSIVE METABOLIC PANEL
ALT: 10 U/L (ref 0–44)
AST: 15 U/L (ref 15–41)
Albumin: 4.3 g/dL (ref 3.5–5.0)
Alkaline Phosphatase: 57 U/L (ref 38–126)
Anion gap: 8 (ref 5–15)
BUN: 8 mg/dL (ref 6–20)
CO2: 23 mmol/L (ref 22–32)
Calcium: 9.2 mg/dL (ref 8.9–10.3)
Chloride: 102 mmol/L (ref 98–111)
Creatinine, Ser: 0.71 mg/dL (ref 0.44–1.00)
GFR, Estimated: >60 mL/min (ref >60)
Glucose, Bld: 109 mg/dL — ABNORMAL HIGH (ref 70–99)
Potassium: 3.4 mmol/L — ABNORMAL LOW (ref 3.5–5.1)
Sodium: 133 mmol/L — ABNORMAL LOW (ref 135–145)
Total Bilirubin: 0.4 mg/dL (ref 0.3–1.2)
Total Protein: 8.3 g/dL — ABNORMAL HIGH (ref 6.5–8.1)

## 2021-10-23 LAB — RESP PANEL BY RT-PCR (FLU A&B, COVID) ARPGX2
Influenza A by PCR: NEGATIVE
Influenza B by PCR: NEGATIVE
SARS Coronavirus 2 by RT PCR: NEGATIVE

## 2021-10-23 LAB — PROTIME-INR
INR: 1.1 (ref 0.8–1.2)
Prothrombin Time: 13.7 seconds (ref 11.4–15.2)

## 2021-10-23 LAB — URINE DRUG SCREEN, QUALITATIVE (ARMC ONLY)
Amphetamines, Ur Screen: POSITIVE — AB
Barbiturates, Ur Screen: NOT DETECTED
Benzodiazepine, Ur Scrn: POSITIVE — AB
Cannabinoid 50 Ng, Ur ~~LOC~~: POSITIVE — AB
Cocaine Metabolite,Ur ~~LOC~~: POSITIVE — AB
MDMA (Ecstasy)Ur Screen: NOT DETECTED
Methadone Scn, Ur: NOT DETECTED
Opiate, Ur Screen: POSITIVE — AB
Phencyclidine (PCP) Ur S: NOT DETECTED
Tricyclic, Ur Screen: NOT DETECTED

## 2021-10-23 LAB — TROPONIN I (HIGH SENSITIVITY)
Troponin I (High Sensitivity): 176 ng/L (ref <18)
Troponin I (High Sensitivity): 181 ng/L (ref <18)
Troponin I (High Sensitivity): 8 ng/L (ref <18)
Troponin I (High Sensitivity): 127 ng/L (ref <18)

## 2021-10-23 LAB — CK: Total CK: 68 U/L (ref 38–234)

## 2021-10-23 LAB — MAGNESIUM: Magnesium: 2.1 mg/dL (ref 1.7–2.4)

## 2021-10-23 LAB — APTT: aPTT: 30 seconds (ref 24–36)

## 2021-10-23 LAB — ACETAMINOPHEN LEVEL: Acetaminophen (Tylenol), Serum: <10 ug/mL — ABNORMAL LOW (ref 10–30)

## 2021-10-23 LAB — LIPASE, BLOOD: Lipase: 31 U/L (ref 11–51)

## 2021-10-23 LAB — HEPARIN LEVEL (UNFRACTIONATED): Heparin Unfractionated: 0.15 IU/mL — ABNORMAL LOW (ref 0.30–0.70)

## 2021-10-23 LAB — ETHANOL: Alcohol, Ethyl (B): <10 mg/dL (ref <10)

## 2021-10-23 LAB — SALICYLATE LEVEL: Salicylate Lvl: <7 mg/dL — ABNORMAL LOW (ref 7.0–30.0)

## 2021-10-23 MED ORDER — ACETAMINOPHEN 325 MG PO TABS: 650.0000 mg | ORAL_TABLET | ORAL | Status: DC | PRN

## 2021-10-23 MED ORDER — POTASSIUM CHLORIDE CRYS ER 20 MEQ PO TBCR
40.0000 meq | EXTENDED_RELEASE_TABLET | Freq: Once | ORAL | Status: AC
  Administered 2021-10-23: 40 meq via ORAL
  Filled 2021-10-23: qty 2

## 2021-10-23 MED ORDER — SODIUM CHLORIDE 0.9 % IV SOLN: 250.0000 mL | INTRAVENOUS | Status: DC | PRN

## 2021-10-23 MED ORDER — ASPIRIN EC 81 MG PO TBEC
81.0000 mg | DELAYED_RELEASE_TABLET | Freq: Every day | ORAL | Status: DC
  Administered 2021-10-24: 81 mg via ORAL
  Filled 2021-10-23: qty 1

## 2021-10-23 MED ORDER — LORAZEPAM 1 MG PO TABS
1.0000 mg | ORAL_TABLET | ORAL | Status: DC | PRN
  Administered 2021-10-23: 2 mg via ORAL
  Administered 2021-10-24: 1 mg via ORAL
  Filled 2021-10-23: qty 2
  Filled 2021-10-23: qty 1

## 2021-10-23 MED ORDER — ONDANSETRON 4 MG PO TBDP
4.0000 mg | ORAL_TABLET | Freq: Once | ORAL | Status: AC
  Administered 2021-10-23: 4 mg via ORAL
  Filled 2021-10-23: qty 1

## 2021-10-23 MED ORDER — THIAMINE HCL 100 MG/ML IJ SOLN: 100.0000 mg | Freq: Every day | INTRAMUSCULAR | Status: DC

## 2021-10-23 MED ORDER — FOLIC ACID 1 MG PO TABS
1.0000 mg | ORAL_TABLET | Freq: Every day | ORAL | Status: DC
  Administered 2021-10-23 – 2021-10-24 (×2): 1 mg via ORAL
  Filled 2021-10-23 (×2): qty 1

## 2021-10-23 MED ORDER — ADULT MULTIVITAMIN W/MINERALS CH
1.0000 | ORAL_TABLET | Freq: Every day | ORAL | Status: DC
  Administered 2021-10-23 – 2021-10-24 (×2): 1 via ORAL
  Filled 2021-10-23 (×2): qty 1

## 2021-10-23 MED ORDER — HEPARIN BOLUS VIA INFUSION
3300.0000 [IU] | Freq: Once | INTRAVENOUS | Status: AC
  Administered 2021-10-23: 3300 [IU] via INTRAVENOUS
  Filled 2021-10-23: qty 3300

## 2021-10-23 MED ORDER — HEPARIN (PORCINE) 25000 UT/250ML-% IV SOLN: 1000.0000 [IU]/h | INTRAVENOUS | Status: DC

## 2021-10-23 MED ORDER — ASPIRIN 81 MG PO CHEW: 324.0000 mg | CHEWABLE_TABLET | ORAL | Status: DC

## 2021-10-23 MED ORDER — ASPIRIN 81 MG PO CHEW
324.0000 mg | CHEWABLE_TABLET | Freq: Once | ORAL | Status: AC
  Administered 2021-10-23: 324 mg via ORAL
  Filled 2021-10-23: qty 4

## 2021-10-23 MED ORDER — ONDANSETRON HCL 4 MG/2ML IJ SOLN: 4.0000 mg | Freq: Four times a day (QID) | INTRAMUSCULAR | Status: DC | PRN

## 2021-10-23 MED ORDER — THIAMINE HCL 100 MG PO TABS
100.0000 mg | ORAL_TABLET | Freq: Every day | ORAL | Status: DC
  Administered 2021-10-23 – 2021-10-24 (×2): 100 mg via ORAL
  Filled 2021-10-23 (×2): qty 1

## 2021-10-23 MED ORDER — NITROGLYCERIN 0.4 MG SL SUBL: 0.4000 mg | SUBLINGUAL_TABLET | SUBLINGUAL | Status: DC | PRN

## 2021-10-23 MED ORDER — POTASSIUM CHLORIDE 10 MEQ/100ML IV SOLN: 10.0000 meq | INTRAVENOUS | Status: DC

## 2021-10-23 MED ORDER — ASPIRIN 300 MG RE SUPP: 300.0000 mg | RECTAL | Status: DC

## 2021-10-23 MED ORDER — HEPARIN BOLUS VIA INFUSION
1600.0000 [IU] | Freq: Once | INTRAVENOUS | Status: AC
  Administered 2021-10-23: 1600 [IU] via INTRAVENOUS
  Filled 2021-10-23: qty 1600

## 2021-10-23 MED ORDER — HEPARIN (PORCINE) 25000 UT/250ML-% IV SOLN
INTRAVENOUS | Status: AC
  Administered 2021-10-23: 700 [IU]/h via INTRAVENOUS
  Filled 2021-10-23: qty 250

## 2021-10-23 MED ORDER — ALBUTEROL SULFATE (2.5 MG/3ML) 0.083% IN NEBU: 3.0000 mL | INHALATION_SOLUTION | Freq: Four times a day (QID) | RESPIRATORY_TRACT | Status: DC | PRN

## 2021-10-23 MED ORDER — LORAZEPAM 2 MG/ML IJ SOLN: 1.0000 mg | INTRAMUSCULAR | Status: DC | PRN
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
## 2021-10-23 NOTE — BH Assessment (Signed)
Writer was unable complete TTS consult at this time. Patient is being admitted to medical floor.  ?

## 2021-10-23 NOTE — ED Notes (Signed)
Informed EDP, just got call from lab that troponin is 176. Will have to reinsert IV. Also they need another red top for salicilyte level. ?

## 2021-10-23 NOTE — ED Notes (Signed)
Pt has glasses with her.  ?

## 2021-10-23 NOTE — ED Notes (Signed)
Provided phone for pt to call friend. Provided sandwich tray and water to pt.  ?

## 2021-10-23 NOTE — ED Notes (Signed)
Pt dressed out in hospital attire. Pt belongings include: ? ?Bag #1: ?Bottle of red gatorade ?Hospital discharge papers\tan leather pursepack of cigarettes ?[place] cellphone ? ?Bag #2: ?Black sneakers and black socks ?Gold neclace and black braided bracelet ?Black shirt ?Red bra ?White boxers ?Black pants ?

## 2021-10-23 NOTE — ED Notes (Signed)
Pt will move to room 5 in BHU. ?
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
## 2021-10-23 NOTE — ED Notes (Signed)
Informed RN bed assigned 

## 2021-10-23 NOTE — ED Notes (Signed)
Pt given blanket.

## 2021-10-23 NOTE — ED Notes (Signed)
Called pharmacy for heparin dosage. They are calculating it now. Blue top drawn and sent to lab. ?
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
## 2021-10-23 NOTE — ED Notes (Signed)
Hospitalist at bedside 
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
## 2021-10-23 NOTE — ED Notes (Signed)
Will obtain rest of VS once have functional equipment. ?
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
## 2021-10-23 NOTE — ED Notes (Signed)
Pt taken to xray at this time.

## 2021-10-23 NOTE — ED Notes (Signed)
Sent msg to attending provider to clarify order for chewable aspirin, same dosage of 324mg  chewable was already given.  ?
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
## 2021-10-24 ENCOUNTER — Emergency Department (HOSPITAL_COMMUNITY)
Admission: EM | Admit: 2021-10-24 | Discharge: 2021-10-25 | Disposition: A | Discharge status: Home / Self Care | Payer: Medicaid Other | Attending: Emergency Medicine | Admitting: Emergency Medicine

## 2021-10-24 ENCOUNTER — Other Ambulatory Visit: Payer: Self-pay

## 2021-10-24 ENCOUNTER — Observation Stay (HOSPITAL_BASED_OUTPATIENT_CLINIC_OR_DEPARTMENT_OTHER)
Admit: 2021-10-24 | Discharge: 2021-10-24 | Disposition: A | Discharge status: Home / Self Care | Payer: Medicaid Other | Attending: Internal Medicine | Admitting: Internal Medicine

## 2021-10-24 ENCOUNTER — Emergency Department: Payer: Medicaid Other

## 2021-10-24 ENCOUNTER — Encounter: Payer: Self-pay | Admitting: Internal Medicine

## 2021-10-24 ENCOUNTER — Encounter (HOSPITAL_COMMUNITY): Payer: Self-pay

## 2021-10-24 ENCOUNTER — Emergency Department
Admission: EM | Admit: 2021-10-24 | Discharge: 2021-10-24 | Disposition: A | Discharge status: Home / Self Care | Payer: Medicaid Other | Attending: Emergency Medicine | Admitting: Emergency Medicine

## 2021-10-24 DIAGNOSIS — R45851 Suicidal ideations: Secondary | ICD-10-CM | POA: Diagnosis not present

## 2021-10-24 DIAGNOSIS — F17210 Nicotine dependence, cigarettes, uncomplicated: Secondary | ICD-10-CM | POA: Insufficient documentation

## 2021-10-24 DIAGNOSIS — R000 Tachycardia, unspecified: Secondary | ICD-10-CM | POA: Diagnosis not present

## 2021-10-24 DIAGNOSIS — J45909 Unspecified asthma, uncomplicated: Secondary | ICD-10-CM | POA: Insufficient documentation

## 2021-10-24 DIAGNOSIS — R778 Other specified abnormalities of plasma proteins: Secondary | ICD-10-CM

## 2021-10-24 DIAGNOSIS — R7981 Abnormal blood-gas level: Secondary | ICD-10-CM | POA: Diagnosis not present

## 2021-10-24 DIAGNOSIS — F1220 Cannabis dependence, uncomplicated: Secondary | ICD-10-CM | POA: Diagnosis present

## 2021-10-24 DIAGNOSIS — R079 Chest pain, unspecified: Secondary | ICD-10-CM | POA: Diagnosis not present

## 2021-10-24 DIAGNOSIS — Z046 Encounter for general psychiatric examination, requested by authority: Secondary | ICD-10-CM | POA: Diagnosis present

## 2021-10-24 DIAGNOSIS — R0602 Shortness of breath: Secondary | ICD-10-CM

## 2021-10-24 DIAGNOSIS — Z20822 Contact with and (suspected) exposure to covid-19: Secondary | ICD-10-CM | POA: Insufficient documentation

## 2021-10-24 DIAGNOSIS — I248 Other forms of acute ischemic heart disease: Secondary | ICD-10-CM

## 2021-10-24 DIAGNOSIS — Y900 Blood alcohol level of less than 20 mg/100 ml: Secondary | ICD-10-CM | POA: Insufficient documentation

## 2021-10-24 DIAGNOSIS — Z720 Tobacco use: Secondary | ICD-10-CM

## 2021-10-24 DIAGNOSIS — F1910 Other psychoactive substance abuse, uncomplicated: Secondary | ICD-10-CM | POA: Insufficient documentation

## 2021-10-24 DIAGNOSIS — F1424 Cocaine dependence with cocaine-induced mood disorder: Secondary | ICD-10-CM | POA: Diagnosis not present

## 2021-10-24 DIAGNOSIS — I214 Non-ST elevation (NSTEMI) myocardial infarction: Secondary | ICD-10-CM | POA: Diagnosis not present

## 2021-10-24 DIAGNOSIS — F1310 Sedative, hypnotic or anxiolytic abuse, uncomplicated: Secondary | ICD-10-CM | POA: Insufficient documentation

## 2021-10-24 DIAGNOSIS — F1494 Cocaine use, unspecified with cocaine-induced mood disorder: Secondary | ICD-10-CM

## 2021-10-24 DIAGNOSIS — Z91040 Latex allergy status: Secondary | ICD-10-CM | POA: Insufficient documentation

## 2021-10-24 DIAGNOSIS — Z5900 Homelessness unspecified: Secondary | ICD-10-CM | POA: Diagnosis not present

## 2021-10-24 LAB — ECHOCARDIOGRAM COMPLETE
AR max vel: 3.05 cm2
AV Area VTI: 3.11 cm2
AV Area mean vel: 2.84 cm2
AV Mean grad: 2 mmHg
AV Peak grad: 4.1 mmHg
Ao pk vel: 1.01 m/s
Area-P 1/2: 4.06 cm2
Height: 64 in
MV VTI: 3.35 cm2
S' Lateral: 2.75 cm
Weight: 1920 oz

## 2021-10-24 LAB — COMPREHENSIVE METABOLIC PANEL
ALT: 10 U/L (ref 0–44)
AST: 26 U/L (ref 15–41)
Albumin: 4.6 g/dL (ref 3.5–5.0)
Alkaline Phosphatase: 61 U/L (ref 38–126)
Anion gap: 16 — ABNORMAL HIGH (ref 5–15)
BUN: 16 mg/dL (ref 6–20)
CO2: 19 mmol/L — ABNORMAL LOW (ref 22–32)
Calcium: 9.9 mg/dL (ref 8.9–10.3)
Chloride: 99 mmol/L (ref 98–111)
Creatinine, Ser: 1.12 mg/dL — ABNORMAL HIGH (ref 0.44–1.00)
GFR, Estimated: >60 mL/min (ref >60)
Glucose, Bld: 87 mg/dL (ref 70–99)
Potassium: 4.2 mmol/L (ref 3.5–5.1)
Sodium: 134 mmol/L — ABNORMAL LOW (ref 135–145)
Total Bilirubin: 0.8 mg/dL (ref 0.3–1.2)
Total Protein: 8.7 g/dL — ABNORMAL HIGH (ref 6.5–8.1)

## 2021-10-24 LAB — TROPONIN I (HIGH SENSITIVITY)
Troponin I (High Sensitivity): 40 ng/L — ABNORMAL HIGH (ref <18)
Troponin I (High Sensitivity): 43 ng/L — ABNORMAL HIGH (ref <18)

## 2021-10-24 LAB — CBC WITH DIFFERENTIAL/PLATELET
Abs Immature Granulocytes: 0.02 10*3/uL (ref 0.00–0.07)
Basophils Absolute: 0 10*3/uL (ref 0.0–0.1)
Basophils Relative: 0 %
Eosinophils Absolute: 0 10*3/uL (ref 0.0–0.5)
Eosinophils Relative: 0 %
HCT: 44.9 % (ref 36.0–46.0)
Hemoglobin: 15.1 g/dL — ABNORMAL HIGH (ref 12.0–15.0)
Immature Granulocytes: 0 %
Lymphocytes Relative: 45 %
Lymphs Abs: 3.6 10*3/uL (ref 0.7–4.0)
MCH: 33.2 pg (ref 26.0–34.0)
MCHC: 33.6 g/dL (ref 30.0–36.0)
MCV: 98.7 fL (ref 80.0–100.0)
Monocytes Absolute: 0.5 10*3/uL (ref 0.1–1.0)
Monocytes Relative: 7 %
Neutro Abs: 3.8 10*3/uL (ref 1.7–7.7)
Neutrophils Relative %: 48 %
Platelets: 492 10*3/uL — ABNORMAL HIGH (ref 150–400)
RBC: 4.55 MIL/uL (ref 3.87–5.11)
RDW: 13.2 % (ref 11.5–15.5)
WBC: 8 10*3/uL (ref 4.0–10.5)
nRBC: 0 % (ref 0.0–0.2)

## 2021-10-24 LAB — CBC
HCT: 46 % (ref 36.0–46.0)
Hemoglobin: 15.3 g/dL — ABNORMAL HIGH (ref 12.0–15.0)
MCH: 33.1 pg (ref 26.0–34.0)
MCHC: 33.3 g/dL (ref 30.0–36.0)
MCV: 99.6 fL (ref 80.0–100.0)
Platelets: 413 10*3/uL — ABNORMAL HIGH (ref 150–400)
RBC: 4.62 MIL/uL (ref 3.87–5.11)
RDW: 13.2 % (ref 11.5–15.5)
WBC: 7 10*3/uL (ref 4.0–10.5)
nRBC: 0 % (ref 0.0–0.2)

## 2021-10-24 LAB — LIPID PANEL
Cholesterol: 176 mg/dL (ref 0–200)
HDL: 39 mg/dL — ABNORMAL LOW (ref >40)
LDL Cholesterol: 114 mg/dL — ABNORMAL HIGH (ref 0–99)
Total CHOL/HDL Ratio: 4.5 RATIO
Triglycerides: 115 mg/dL (ref <150)
VLDL: 23 mg/dL (ref 0–40)

## 2021-10-24 LAB — RESP PANEL BY RT-PCR (FLU A&B, COVID) ARPGX2
Influenza A by PCR: NEGATIVE
Influenza B by PCR: NEGATIVE
SARS Coronavirus 2 by RT PCR: NEGATIVE

## 2021-10-24 LAB — PROCALCITONIN: Procalcitonin: <0.1 ng/mL

## 2021-10-24 LAB — HEPARIN LEVEL (UNFRACTIONATED)
Heparin Unfractionated: 0.2 IU/mL — ABNORMAL LOW (ref 0.30–0.70)
Heparin Unfractionated: <0.1 IU/mL — ABNORMAL LOW (ref 0.30–0.70)

## 2021-10-24 LAB — HCG, QUANTITATIVE, PREGNANCY: hCG, Beta Chain, Quant, S: <1 m[IU]/mL (ref <5)

## 2021-10-24 LAB — D-DIMER, QUANTITATIVE: D-Dimer, Quant: 0.37 ug/mL-FEU (ref 0.00–0.50)

## 2021-10-24 LAB — MAGNESIUM: Magnesium: 2.3 mg/dL (ref 1.7–2.4)

## 2021-10-24 MED ORDER — TRAMADOL HCL 50 MG PO TABS
50.0000 mg | ORAL_TABLET | ORAL | Status: AC
  Administered 2021-10-24: 50 mg via ORAL
  Filled 2021-10-24: qty 1

## 2021-10-24 MED ORDER — PNEUMOCOCCAL 20-VAL CONJ VACC 0.5 ML IM SUSY
0.5000 mL | PREFILLED_SYRINGE | INTRAMUSCULAR | Status: DC | PRN
  Filled 2021-10-24: qty 0.5

## 2021-10-24 MED ORDER — KETOROLAC TROMETHAMINE 30 MG/ML IJ SOLN
15.0000 mg | Freq: Once | INTRAMUSCULAR | Status: AC
  Administered 2021-10-24: 15 mg via INTRAVENOUS
  Filled 2021-10-24: qty 1

## 2021-10-24 MED ORDER — LACTATED RINGERS IV BOLUS
1000.0000 mL | Freq: Once | INTRAVENOUS | Status: AC
  Administered 2021-10-24: 1000 mL via INTRAVENOUS

## 2021-10-24 MED ORDER — IPRATROPIUM-ALBUTEROL 0.5-2.5 (3) MG/3ML IN SOLN
3.0000 mL | Freq: Once | RESPIRATORY_TRACT | Status: AC
  Administered 2021-10-24: 3 mL via RESPIRATORY_TRACT
  Filled 2021-10-24: qty 3

## 2021-10-24 MED ORDER — HEPARIN BOLUS VIA INFUSION
800.0000 [IU] | Freq: Once | INTRAVENOUS | Status: AC
  Administered 2021-10-24: 800 [IU] via INTRAVENOUS
  Filled 2021-10-24: qty 800
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
## 2021-10-24 NOTE — Plan of Care (Signed)
?  Problem: Education: ?Goal: Knowledge of General Education information will improve ?Description: Including pain rating scale, medication(s)/side effects and non-pharmacologic comfort measures ?Outcome: Progressing ?  ?Problem: Health Behavior/Discharge Planning: ?Goal: Ability to manage health-related needs will improve ?Outcome: Progressing ?  ?Problem: Clinical Measurements: ?Goal: Ability to maintain clinical measurements within normal limits will improve ?Outcome: Progressing ?  ?Problem: Clinical Measurements: ?Goal: Diagnostic test results will improve ?Outcome: Progressing ?  ?Problem: Clinical Measurements: ?Goal: Cardiovascular complication will be avoided ?Outcome: Progressing ?  ?Problem: Coping: ?Goal: Level of anxiety will decrease ?Outcome: Progressing ?  ?Problem: Pain Managment: ?Goal: General experience of comfort will improve ?Outcome: Progressing ?  ?
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
## 2021-10-24 NOTE — ED Triage Notes (Signed)
Pt assisted out of car for SOB after calling ER desk and requesting assistance out of car. Pt states she was just discharged after hospital stay for allergic reaction. Pt reports she took her albuterol inhaler and started feeling SOB. Pt repeatedly stating, "help me, help me" to this RN. Pt denies any drug or alcohol use since discharge. Pt with hx meth, cocaine, alcohol use. Pt denies SI or HI. Pt poor historian.  ?
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
## 2021-10-24 NOTE — Plan of Care (Signed)

## 2021-10-24 NOTE — Progress Notes (Signed)
Patient asked if there was a ride we could call for transport home as it is not advised to drive after taking pain medication. Patient denies needing a ride and that her car is here at the ER. Advised to wait before leaving but patient declined education. ?

## 2021-10-24 NOTE — ED Provider Notes (Signed)
?[facility] EMERGENCY DEPARTMENT ?Provider Note ? ? ?CSN: [id number] ?Arrival date & time: 10/24/21  2249 ? ?  ? ?History ? ?Chief Complaint  ?Patient presents with  ? Suicidal  ? Drug Problem  ? ? ?Katherine Burgess is a 41 y.o. female. ? ?The history is provided by the patient and medical records.  ?Drug Problem ? ?41 year old female with history of anxiety, polysubstance abuse, recent admission to [facility] yesterday for NSTEMI, presenting to the ED with SI.  States she feels like after she left the hospital that she has been "poisoned".  Reports doing 2 lines of cocaine from a friend that she states "something was mixed in".  Also reports she feels like her mother has poisoned her Gatorade and cigarettes.  She reports she feels unsafe when she is alone.  She denies any HI/AVH. ? ?Chart reviewed, ultimately left [facility] earlier today but return to the ED for evaluation again this afternoon.  Troponins at that time were downtrending and flat so was felt stable for discharge.  Covid test yesterday was negative. ? ?Home Medications ?Prior to Admission medications   ?Medication Sig Start Date End Date Taking? Authorizing Provider  ?albuterol (VENTOLIN HFA) 108 (90 Base) MCG/ACT inhaler Inhale 2 puffs into the lungs every 6 (six) hours as needed for wheezing or shortness of breath. ?Patient not taking: Reported on 10/24/2021    [provider]  ?   ? ?Allergies    ?Bactrim, Flagyl [metronidazole hcl], Lithium, Onion, Other, Peanuts [nuts], Sulfamethoxazole-trimethoprim, Latex, and Tape   ? ?Review of Systems   ?Review of Systems  ?Psychiatric/Behavioral:  Positive for suicidal ideas.   ?All other systems reviewed and are negative. ? ?Physical Exam ?Updated Vital Signs ?BP (!) 155/97 (BP Location: Right Arm)   Pulse 82   Temp 98.4 ?F (36.9 ?C) (Oral)   Resp 20   LMP 10/16/2021 (Approximate)   SpO2 99%  ? ?Physical Exam ?Vitals and nursing note reviewed.  ?Constitutional:   ?   Appearance: She is  well-developed.  ?HENT:  ?   Head: Normocephalic and atraumatic.  ?Eyes:  ?   Conjunctiva/sclera: Conjunctivae normal.  ?   Pupils: Pupils are equal, round, and reactive to light.  ?Cardiovascular:  ?   Rate and Rhythm: Normal rate and regular rhythm.  ?   Heart sounds: Normal heart sounds.  ?Pulmonary:  ?   Effort: Pulmonary effort is normal. No respiratory distress.  ?   Breath sounds: Normal breath sounds. No rhonchi.  ?Abdominal:  ?   General: Bowel sounds are normal.  ?   Palpations: Abdomen is soft.  ?   Tenderness: There is no abdominal tenderness. There is no rebound.  ?Musculoskeletal:     ?   General: Normal range of motion.  ?   Cervical back: Normal range of motion.  ?Skin: ?   General: Skin is warm and dry.  ?Neurological:  ?   Mental Status: She is alert and oriented to person, place, and time.  ?Psychiatric:     ?   Thought Content: Thought content includes suicidal ideation.  ?   Comments: Seems paranoid ?SI without plan ?Denies HI/AVH  ? ? ?ED Results / Procedures / Treatments   ?Labs ?(all labs ordered are listed, but only abnormal results are displayed) ?Labs Reviewed  ?CBC WITH DIFFERENTIAL/PLATELET - Abnormal; Notable for the following components:  ?    Result Value  ? MCV 100.5 (*)   ? All other components within  normal limits  ?COMPREHENSIVE METABOLIC PANEL - Abnormal; Notable for the following components:  ? Sodium 133 (*)   ? CO2 18 (*)   ? AST 13 (*)   ? All other components within normal limits  ?TROPONIN I (HIGH SENSITIVITY) - Abnormal; Notable for the following components:  ? Troponin I (High Sensitivity) 30 (*)   ? All other components within normal limits  ?TROPONIN I (HIGH SENSITIVITY) - Abnormal; Notable for the following components:  ? Troponin I (High Sensitivity) 34 (*)   ? All other components within normal limits  ?ETHANOL  ?RAPID URINE DRUG SCREEN, HOSP PERFORMED  ?SALICYLATE LEVEL  ?ACETAMINOPHEN LEVEL  ?I-STAT BETA HCG BLOOD, ED ([facility], [facility], [facility] ONLY)   ? ? ?EKG ?None ? ?Radiology ?DG Chest 2 View ? ?Result Date: 10/24/2021 ?CLINICAL DATA:  Shortness of breath EXAM: CHEST - 2 VIEW COMPARISON:  10/23/2021 FINDINGS: The heart size and mediastinal contours are within normal limits. Both lungs are clear. The visualized skeletal structures are unremarkable. IMPRESSION: No acute abnormality of the lungs. Electronically Signed   By: Jearld Lesch M.D.   On: 10/24/2021 15:17  ? ?DG Chest 2 View ? ?Result Date: 10/23/2021 ?CLINICAL DATA:  Chest pain EXAM: CHEST - 2 VIEW COMPARISON:  07/14/2021 FINDINGS: The heart size and mediastinal contours are within normal limits. Both lungs are clear. The visualized skeletal structures are unremarkable. IMPRESSION: No active cardiopulmonary disease. Electronically Signed   By: Kennith Center M.D.   On: 10/23/2021 13:41  ? ?ECHOCARDIOGRAM COMPLETE ? ?Result Date: 10/24/2021 ?   ECHOCARDIOGRAM REPORT   Patient Name:   Katherine Burgess Date of Exam: 10/24/2021 Medical Rec #:  [id number]     Height:       64.0 in Accession #:    [id number]    Weight:       120.0 lb Date of Birth:  [birth year]     BSA:          1.575 m? Patient Age:    40 years      BP:           96/72 mmHg Patient Gender: F             HR:           72 bpm. Exam Location:  [facility] Procedure: 2D Echo, Color Doppler and Cardiac Doppler Indications:     I21.4 NSTEMI  History:         Patient has no prior history of Echocardiogram examinations.                  Risk Factors:Current Smoker. Hx of polysubstance abuse.  Sonographer:     Humphrey Rolls Referring Phys:  [id number] MICHAEL E NORINS Diagnosing Phys: Julien Nordmann MD IMPRESSIONS  1. Left ventricular ejection fraction, by estimation, is 60 to 65%. The left ventricle has normal function. The left ventricle has no regional wall motion abnormalities. Left ventricular diastolic parameters are consistent with Grade I diastolic dysfunction (impaired relaxation).  2. Right ventricular systolic function is normal. The right ventricular size is  normal. Tricuspid regurgitation signal is inadequate for assessing PA pressure.  3. The mitral valve is normal in structure. No evidence of mitral valve regurgitation. No evidence of mitral stenosis.  4. The aortic valve is normal in structure. Aortic valve regurgitation is not visualized. No aortic stenosis is present.  5. The inferior vena cava is normal in size with greater than 50% respiratory variability, suggesting right atrial pressure of  3 mmHg. FINDINGS  Left Ventricle: Left ventricular ejection fraction, by estimation, is 60 to 65%. The left ventricle has normal function. The left ventricle has no regional wall motion abnormalities. The left ventricular internal cavity size was normal in size. There is  no left ventricular hypertrophy. Left ventricular diastolic parameters are consistent with Grade I diastolic dysfunction (impaired relaxation). Right Ventricle: The right ventricular size is normal. No increase in right ventricular wall thickness. Right ventricular systolic function is normal. Tricuspid regurgitation signal is inadequate for assessing PA pressure. Left Atrium: Left atrial size was normal in size. Right Atrium: Right atrial size was normal in size. Pericardium: There is no evidence of pericardial effusion. Mitral Valve: The mitral valve is normal in structure. No evidence of mitral valve regurgitation. No evidence of mitral valve stenosis. MV peak gradient, 2.7 mmHg. The mean mitral valve gradient is 1.0 mmHg. Tricuspid Valve: The tricuspid valve is normal in structure. Tricuspid valve regurgitation is not demonstrated. No evidence of tricuspid stenosis. Aortic Valve: The aortic valve is normal in structure. Aortic valve regurgitation is not visualized. No aortic stenosis is present. Aortic valve mean gradient measures 2.0 mmHg. Aortic valve peak gradient measures 4.1 mmHg. Aortic valve area, by VTI measures 3.11 cm?. Pulmonic Valve: The pulmonic valve was normal in structure. Pulmonic valve  regurgitation is not visualized. No evidence of pulmonic stenosis. Aorta: The aortic root is normal in size and structure. Venous: The inferior vena cava is normal in size with greater than 50% respiratory variability, suggesting right at

## 2021-10-24 NOTE — Progress Notes (Signed)
*  PRELIMINARY RESULTS* ?Echocardiogram ?2D Echocardiogram has been performed. ? ?Katherine Burgess ?10/24/2021, 8:54 AM ?

## 2021-10-24 NOTE — ED Triage Notes (Signed)
Pt states "I want to commit myself because of my drug use." She reports she wants help to get off of drugs: meth, crack cocaine, xanax, vicodin, roxys, percocet. She reports she is scared to be alone when asked if she was SI.  ?

## 2021-10-24 NOTE — TOC Initial Note (Signed)
Transition of Care (TOC) - Initial/Assessment Note  ? ? ?Patient Details  ?Name: Katherine Burgess ?MRN: [id number] ?Date of Birth: August 14, 1980 ? ?Transition of Care (TOC) CM/SW Contact:    ?Alberteen Sam, LCSW ?Phone Number: ?10/24/2021, 11:57 AM ? ?Clinical Narrative:                 ? ?CSW met with patient at bedside, reviewed and provided both substance abuse inpatient resources and outpatient resources. Patient reports she has no other needs at this time. ? ?TOC signing off.   ? ?Expected Discharge Plan: Home/Self Care ?Barriers to Discharge: Continued Medical Work up ? ? ?Patient Goals and CMS Choice ?Patient states their goals for this hospitalization and ongoing recovery are:: to go home ?  ?Choice offered to / list presented to : Patient ? ?Expected Discharge Plan and Services ?Expected Discharge Plan: Home/Self Care ?  ?  ?  ?  ?                ?  ?  ?  ?  ?  ?  ?  ?  ?  ?  ? ?Prior Living Arrangements/Services ?  ?Lives with:: Self ?  ?       ?  ?  ?  ?  ? ?Activities of Daily Living ?Home Assistive Devices/Equipment: Eyeglasses ?ADL Screening (condition at time of admission) ?Patient's cognitive ability adequate to safely complete daily activities?: Yes ?Is the patient deaf or have difficulty hearing?: No ?Does the patient have difficulty seeing, even when wearing glasses/contacts?: No ?Does the patient have difficulty concentrating, remembering, or making decisions?: No ?Patient able to express need for assistance with ADLs?: Yes ?Does the patient have difficulty dressing or bathing?: No ?Independently performs ADLs?: Yes (appropriate for developmental age) ?Does the patient have difficulty walking or climbing stairs?: No ?Weakness of Legs: None ?Weakness of Arms/Hands: None ? ?Permission Sought/Granted ?  ?  ?   ?   ?   ?   ? ?Emotional Assessment ?  ?  ?  ?  ?  ?  ? ?Admission diagnosis:  Suicidal ideation [R45.851] ?Polysubstance abuse ([place]) [F19.10] ?NSTEMI (non-ST elevated myocardial infarction) ([place])  [I21.4] ?Patient Active Problem List  ? Diagnosis Date Noted  ? Polysubstance abuse ([place]) 10/23/2021  ? NSTEMI (non-ST elevated myocardial infarction) ([place]) 10/23/2021  ? 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 Anxiety 02/07/2020  ? Cannabis use disorder, moderate, dependence ([place]) 02/07/2020  ? Opioid use disorder, mild, abuse ([place]) 01/24/2020  ? Hydrosalpinx 08/18/2013  ? Dyspareunia 03/30/2013  ? PID (acute pelvic inflammatory disease) 03/30/2013  ? ?PCP:  Carrolyn Meiers, MD ?Pharmacy:   ?[id number] - [place], [address] ?[place] ?[place] [address] ?Phone: [phone] Fax: [phone] ? ? ? ? ?Social Determinants of Health (SDOH) Interventions ?  ? ?Readmission Risk Interventions ?   ? View : No data to display.  ?  ?  ?  ? ? ? ?

## 2021-10-24 NOTE — ED Notes (Signed)
Pt presents to ED and had to be assisted out of her car stating she was Sob. Pt was hyperventilating on arrival to RM 2, pt's O2 sat is 100% a this time. Pt c/o of numbness to hands and pt was educated to slow her breathing down. Pt was just D/C'ed from this hospital PTA and denies taking any drugs after D/C. Pt has a flight of ideas and appears to be under the influence at this time.  ?

## 2021-10-25 DIAGNOSIS — F1494 Cocaine use, unspecified with cocaine-induced mood disorder: Secondary | ICD-10-CM

## 2021-10-25 LAB — COMPREHENSIVE METABOLIC PANEL
ALT: 8 U/L (ref 0–44)
AST: 13 U/L — ABNORMAL LOW (ref 15–41)
Albumin: 3.8 g/dL (ref 3.5–5.0)
Alkaline Phosphatase: 52 U/L (ref 38–126)
Anion gap: 11 (ref 5–15)
BUN: 13 mg/dL (ref 6–20)
CO2: 18 mmol/L — ABNORMAL LOW (ref 22–32)
Calcium: 9 mg/dL (ref 8.9–10.3)
Chloride: 104 mmol/L (ref 98–111)
Creatinine, Ser: 0.95 mg/dL (ref 0.44–1.00)
GFR, Estimated: >60 mL/min (ref >60)
Glucose, Bld: 90 mg/dL (ref 70–99)
Potassium: 3.9 mmol/L (ref 3.5–5.1)
Sodium: 133 mmol/L — ABNORMAL LOW (ref 135–145)
Total Bilirubin: UNDETERMINED mg/dL (ref 0.3–1.2)
Total Protein: 6.8 g/dL (ref 6.5–8.1)

## 2021-10-25 LAB — CBC WITH DIFFERENTIAL/PLATELET
Abs Immature Granulocytes: 0.02 10*3/uL (ref 0.00–0.07)
Basophils Absolute: 0 10*3/uL (ref 0.0–0.1)
Basophils Relative: 0 %
Eosinophils Absolute: 0 10*3/uL (ref 0.0–0.5)
Eosinophils Relative: 1 %
HCT: 39 % (ref 36.0–46.0)
Hemoglobin: 13 g/dL (ref 12.0–15.0)
Immature Granulocytes: 0 %
Lymphocytes Relative: 36 %
Lymphs Abs: 2.6 10*3/uL (ref 0.7–4.0)
MCH: 33.5 pg (ref 26.0–34.0)
MCHC: 33.3 g/dL (ref 30.0–36.0)
MCV: 100.5 fL — ABNORMAL HIGH (ref 80.0–100.0)
Monocytes Absolute: 0.5 10*3/uL (ref 0.1–1.0)
Monocytes Relative: 7 %
Neutro Abs: 4.1 10*3/uL (ref 1.7–7.7)
Neutrophils Relative %: 56 %
Platelets: 390 10*3/uL (ref 150–400)
RBC: 3.88 MIL/uL (ref 3.87–5.11)
RDW: 13.2 % (ref 11.5–15.5)
WBC: 7.4 10*3/uL (ref 4.0–10.5)
nRBC: 0 % (ref 0.0–0.2)

## 2021-10-25 LAB — RAPID URINE DRUG SCREEN, HOSP PERFORMED
Amphetamines: POSITIVE — AB
Barbiturates: NOT DETECTED
Benzodiazepines: POSITIVE — AB
Cocaine: POSITIVE — AB
Opiates: NOT DETECTED
Tetrahydrocannabinol: POSITIVE — AB

## 2021-10-25 LAB — ACETAMINOPHEN LEVEL: Acetaminophen (Tylenol), Serum: <10 ug/mL — ABNORMAL LOW (ref 10–30)

## 2021-10-25 LAB — ETHANOL: Alcohol, Ethyl (B): <10 mg/dL (ref <10)

## 2021-10-25 LAB — I-STAT BETA HCG BLOOD, ED (MC, WL, AP ONLY): I-stat hCG, quantitative: <5 m[IU]/mL (ref <5)

## 2021-10-25 LAB — TROPONIN I (HIGH SENSITIVITY)
Troponin I (High Sensitivity): 30 ng/L — ABNORMAL HIGH (ref <18)
Troponin I (High Sensitivity): 34 ng/L — ABNORMAL HIGH (ref <18)

## 2021-10-25 LAB — SALICYLATE LEVEL: Salicylate Lvl: <7 mg/dL — ABNORMAL LOW (ref 7.0–30.0)

## 2021-10-25 NOTE — Discharge Instructions (Signed)
To help you maintain a sober lifestyle, a substance use disorder treatment program may be beneficial to you.  Contact one of the following providers at your earliest opportunity to ask about enrolling in their program:  ? ?RESIDENTIAL TREATMENT PROGRAMS: ? ?     [facility]  ?     [address]  ?     [place], [place] [address]  ?     [phone]  ? ?RESIDENTIAL REHAB PROGRAMS - LONG TERM, CHARITABLE:  ? ?     [facility]  ?     [address]  ?     [place], [place] [address]  ?     [phone]  ? ?     [facility]  ?     [address]  ?     [place], [place] [address]  ?     [phone]  ? ?     [facility]  ?     [address]  ?     [place], [place] [address]  ?     [phone]  ? ?     [facility] ?     [address] ?     [place], [place] [address] ?     [phone]  ? ?     [facility]  ?     [address]  ?     [place], [place] [address]  ?     [phone]  ? ?CHEMICAL DEPENDENCY INTENSIVE OUTPATIENT PROGRAM: ? ?     [facility] ?     [address] ?     [place], [place] [address] ?     [phone]  ?

## 2021-10-25 NOTE — BH Assessment (Signed)
[facility] Assessment Progress Note ?  ?Per Ophelia Shoulder, NP, this pt does not require psychiatric hospitalization at this time.  Pt is psychiatrically cleared.  Pt would, however, benefit from admission to a residential treatment program for substance use disorders.  Discharge instructions include referral information for such programs, along with recommendation that pt be provided with a phone to contact them to inquire about the nature of the programs and whether they have beds available at this time.  Marlinda Mike has been notified. ? ?Doylene Canning, MA ?Triage Specialist ?[phone] ? ?

## 2021-10-25 NOTE — BH Assessment (Signed)
Per Dr. Lucianne Muss in the 9 am bed meeting, No need for a TTS CCA at this time. Provider to assess. Pt was just discharged from [facility] 10/24/21.  ?

## 2021-10-25 NOTE — ED Notes (Signed)
Patient on TTS 
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
## 2021-10-25 NOTE — ED Notes (Signed)
LAB ERROR - DARK GREEN NEEDS TO BE REDRAWN ? ?

## 2021-10-25 NOTE — ED Notes (Signed)
Patient given discharge instructions, all questions answered. Patient in possession of all belongings, directed to the discharge area  
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
## 2021-12-02 ENCOUNTER — Other Ambulatory Visit: Payer: Self-pay

## 2021-12-02 ENCOUNTER — Emergency Department (HOSPITAL_COMMUNITY)
Admission: EM | Admit: 2021-12-02 | Discharge: 2021-12-02 | Disposition: A | Discharge status: Home / Self Care | Payer: Medicaid Other | Attending: Emergency Medicine | Admitting: Emergency Medicine

## 2021-12-02 ENCOUNTER — Other Ambulatory Visit (HOSPITAL_COMMUNITY)
Admission: EM | Admit: 2021-12-02 | Discharge: 2021-12-04 | Disposition: A | Discharge status: Home / Self Care | Payer: Medicaid Other | Source: Home / Self Care | Admitting: Psychiatry

## 2021-12-02 DIAGNOSIS — Z20822 Contact with and (suspected) exposure to covid-19: Secondary | ICD-10-CM | POA: Insufficient documentation

## 2021-12-02 DIAGNOSIS — F1910 Other psychoactive substance abuse, uncomplicated: Secondary | ICD-10-CM | POA: Insufficient documentation

## 2021-12-02 DIAGNOSIS — Z91010 Allergy to peanuts: Secondary | ICD-10-CM | POA: Diagnosis not present

## 2021-12-02 DIAGNOSIS — Z5900 Homelessness unspecified: Secondary | ICD-10-CM | POA: Diagnosis present

## 2021-12-02 DIAGNOSIS — Z5902 Unsheltered homelessness: Secondary | ICD-10-CM | POA: Insufficient documentation

## 2021-12-02 DIAGNOSIS — F3130 Bipolar disorder, current episode depressed, mild or moderate severity, unspecified: Secondary | ICD-10-CM | POA: Diagnosis present

## 2021-12-02 DIAGNOSIS — Z91040 Latex allergy status: Secondary | ICD-10-CM | POA: Diagnosis not present

## 2021-12-02 DIAGNOSIS — F419 Anxiety disorder, unspecified: Secondary | ICD-10-CM | POA: Insufficient documentation

## 2021-12-02 LAB — COMPREHENSIVE METABOLIC PANEL
ALT: 15 U/L (ref 0–44)
AST: 15 U/L (ref 15–41)
Albumin: 3.9 g/dL (ref 3.5–5.0)
Alkaline Phosphatase: 53 U/L (ref 38–126)
Anion gap: 10 (ref 5–15)
BUN: 9 mg/dL (ref 6–20)
CO2: 23 mmol/L (ref 22–32)
Calcium: 9.3 mg/dL (ref 8.9–10.3)
Chloride: 100 mmol/L (ref 98–111)
Creatinine, Ser: 0.77 mg/dL (ref 0.44–1.00)
GFR, Estimated: >60 mL/min (ref >60)
Glucose, Bld: 98 mg/dL (ref 70–99)
Potassium: 4.6 mmol/L (ref 3.5–5.1)
Sodium: 133 mmol/L — ABNORMAL LOW (ref 135–145)
Total Bilirubin: 0.4 mg/dL (ref 0.3–1.2)
Total Protein: 6.7 g/dL (ref 6.5–8.1)

## 2021-12-02 LAB — LIPID PANEL
Cholesterol: 144 mg/dL (ref 0–200)
HDL: 47 mg/dL (ref >40)
LDL Cholesterol: 87 mg/dL (ref 0–99)
Total CHOL/HDL Ratio: 3.1 RATIO
Triglycerides: 48 mg/dL (ref <150)
VLDL: 10 mg/dL (ref 0–40)

## 2021-12-02 LAB — CBC WITH DIFFERENTIAL/PLATELET
Abs Immature Granulocytes: 0.03 10*3/uL (ref 0.00–0.07)
Basophils Absolute: 0 10*3/uL (ref 0.0–0.1)
Basophils Relative: 0 %
Eosinophils Absolute: 0 10*3/uL (ref 0.0–0.5)
Eosinophils Relative: 0 %
HCT: 42.2 % (ref 36.0–46.0)
Hemoglobin: 14.4 g/dL (ref 12.0–15.0)
Immature Granulocytes: 0 %
Lymphocytes Relative: 25 %
Lymphs Abs: 3 10*3/uL (ref 0.7–4.0)
MCH: 33.9 pg (ref 26.0–34.0)
MCHC: 34.1 g/dL (ref 30.0–36.0)
MCV: 99.3 fL (ref 80.0–100.0)
Monocytes Absolute: 0.6 10*3/uL (ref 0.1–1.0)
Monocytes Relative: 5 %
Neutro Abs: 8.2 10*3/uL — ABNORMAL HIGH (ref 1.7–7.7)
Neutrophils Relative %: 70 %
Platelets: 423 10*3/uL — ABNORMAL HIGH (ref 150–400)
RBC: 4.25 MIL/uL (ref 3.87–5.11)
RDW: 12.7 % (ref 11.5–15.5)
WBC: 11.9 10*3/uL — ABNORMAL HIGH (ref 4.0–10.5)
nRBC: 0 % (ref 0.0–0.2)

## 2021-12-02 LAB — POC URINE PREG, ED: Preg Test, Ur: NEGATIVE

## 2021-12-02 LAB — POCT URINE DRUG SCREEN - MANUAL ENTRY (I-SCREEN)
POC Amphetamine UR: POSITIVE — AB
POC Buprenorphine (BUP): NOT DETECTED
POC Cocaine UR: POSITIVE — AB
POC Marijuana UR: POSITIVE — AB
POC Methadone UR: NOT DETECTED
POC Methamphetamine UR: POSITIVE — AB
POC Morphine: NOT DETECTED
POC Oxazepam (BZO): POSITIVE — AB
POC Oxycodone UR: POSITIVE — AB
POC Secobarbital (BAR): NOT DETECTED

## 2021-12-02 LAB — URINALYSIS, COMPLETE (UACMP) WITH MICROSCOPIC
Bilirubin Urine: NEGATIVE
Glucose, UA: NEGATIVE mg/dL
Hgb urine dipstick: NEGATIVE
Ketones, ur: NEGATIVE mg/dL
Leukocytes,Ua: NEGATIVE
Nitrite: NEGATIVE
Protein, ur: NEGATIVE mg/dL
Specific Gravity, Urine: 1.016 (ref 1.005–1.030)
pH: 6 (ref 5.0–8.0)

## 2021-12-02 LAB — MAGNESIUM: Magnesium: 1.9 mg/dL (ref 1.7–2.4)

## 2021-12-02 LAB — RESP PANEL BY RT-PCR (FLU A&B, COVID) ARPGX2
Influenza A by PCR: NEGATIVE
Influenza B by PCR: NEGATIVE
SARS Coronavirus 2 by RT PCR: NEGATIVE

## 2021-12-02 LAB — PREGNANCY, URINE: Preg Test, Ur: NEGATIVE

## 2021-12-02 LAB — HEMOGLOBIN A1C
Hgb A1c MFr Bld: 5.2 % (ref 4.8–5.6)
Mean Plasma Glucose: 102.54 mg/dL

## 2021-12-02 LAB — TSH: TSH: 3.951 u[IU]/mL (ref 0.350–4.500)

## 2021-12-02 LAB — POC SARS CORONAVIRUS 2 AG -  ED: SARS Coronavirus 2 Ag: NEGATIVE

## 2021-12-02 LAB — ETHANOL: Alcohol, Ethyl (B): <10 mg/dL (ref <10)

## 2021-12-02 MED ORDER — CLONIDINE HCL 0.1 MG PO TABS
0.1000 mg | ORAL_TABLET | Freq: Every day | ORAL | Status: DC
Start: 2021-12-06 — End: 2021-12-04

## 2021-12-02 MED ORDER — ALUM & MAG HYDROXIDE-SIMETH 200-200-20 MG/5ML PO SUSP: 30.0000 mL | ORAL | Status: DC | PRN

## 2021-12-02 MED ORDER — ACETAMINOPHEN 325 MG PO TABS
650.0000 mg | ORAL_TABLET | Freq: Four times a day (QID) | ORAL | Status: DC | PRN
  Administered 2021-12-02 – 2021-12-03 (×3): 650 mg via ORAL
  Filled 2021-12-02 (×4): qty 2

## 2021-12-02 MED ORDER — MAGNESIUM HYDROXIDE 400 MG/5ML PO SUSP: 30.0000 mL | Freq: Every day | ORAL | Status: DC | PRN

## 2021-12-02 MED ORDER — NAPROXEN 500 MG PO TABS
500.0000 mg | ORAL_TABLET | Freq: Two times a day (BID) | ORAL | Status: DC | PRN
  Administered 2021-12-02 – 2021-12-04 (×2): 500 mg via ORAL
  Filled 2021-12-02 (×2): qty 1

## 2021-12-02 MED ORDER — ONDANSETRON 4 MG PO TBDP: 4.0000 mg | ORAL_TABLET | Freq: Four times a day (QID) | ORAL | Status: DC | PRN

## 2021-12-02 MED ORDER — METHOCARBAMOL 500 MG PO TABS
500.0000 mg | ORAL_TABLET | Freq: Three times a day (TID) | ORAL | Status: DC | PRN
  Administered 2021-12-04: 500 mg via ORAL
  Filled 2021-12-02: qty 1

## 2021-12-02 MED ORDER — DICYCLOMINE HCL 20 MG PO TABS: 20.0000 mg | ORAL_TABLET | Freq: Four times a day (QID) | ORAL | Status: DC | PRN

## 2021-12-02 MED ORDER — HYDROXYZINE HCL 25 MG PO TABS
25.0000 mg | ORAL_TABLET | Freq: Four times a day (QID) | ORAL | Status: DC | PRN
  Administered 2021-12-02 – 2021-12-04 (×4): 25 mg via ORAL
  Filled 2021-12-02 (×4): qty 1

## 2021-12-02 MED ORDER — LOPERAMIDE HCL 2 MG PO CAPS: 2.0000 mg | ORAL_CAPSULE | ORAL | Status: DC | PRN

## 2021-12-02 MED ORDER — CLONIDINE HCL 0.1 MG PO TABS
0.1000 mg | ORAL_TABLET | ORAL | Status: DC
Start: 2021-12-04 — End: 2021-12-04

## 2021-12-02 MED ORDER — TRAZODONE HCL 50 MG PO TABS
50.0000 mg | ORAL_TABLET | Freq: Every evening | ORAL | Status: DC | PRN
  Administered 2021-12-02: 50 mg via ORAL
  Filled 2021-12-02: qty 1

## 2021-12-02 MED ORDER — LORAZEPAM 1 MG PO TABS
1.0000 mg | ORAL_TABLET | Freq: Once | ORAL | Status: AC
  Administered 2021-12-02: 1 mg via ORAL
  Filled 2021-12-02: qty 1

## 2021-12-02 MED ORDER — CLONIDINE HCL 0.1 MG PO TABS
0.1000 mg | ORAL_TABLET | Freq: Four times a day (QID) | ORAL | Status: DC
Start: 2021-12-02 — End: 2021-12-04
  Administered 2021-12-02 – 2021-12-03 (×5): 0.1 mg via ORAL
  Filled 2021-12-02 (×6): qty 1

## 2021-12-02 NOTE — ED Notes (Signed)
Patient accepted to [facility] due to depression and polysubstance use.  She was calm and cooperative however very tired and was quickly shown around unit and then shown her room where she promptly went to sleep.  No evidence of withdrawal as of this time.  Denies avh shi or plan.  Will monitor.

## 2021-12-02 NOTE — ED Provider Notes (Signed)
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  changes across serial measurements may suggest ACS but many other  chronic and acute conditions are known to elevate hsTnI results.  Refer to the "Links" section for chest pain algorithms and additional  guidance. Performed at [facility], [address] [place], [place] [address]   Admission on 10/23/2021, Discharged on 10/24/2021   Component Date Value Ref Range Status   SARS Coronavirus 2 by RT PCR 10/23/2021 NEGATIVE  NEGATIVE Final   Comment: (NOTE) SARS-CoV-2 target nucleic acids are NOT DETECTED.  The SARS-CoV-2 RNA is generally detectable in upper respiratory specimens during the acute phase of infection. The lowest concentration of SARS-CoV-2 viral copies this assay can detect is 138 copies/mL. A negative result does not preclude SARS-Cov-2 infection and should not be used as the sole basis for treatment or other patient management decisions. A negative result may occur with  improper specimen collection/handling, submission of specimen other than nasopharyngeal swab, presence of viral mutation(s) within the areas targeted by this assay, and inadequate number of viral copies(<138 copies/mL). A negative result must be combined with clinical observations, patient history, and epidemiological  information. The expected result is Negative.  Fact Sheet for Patients:  BloggerCourse.comhttps://www.fda.gov/media/152166/download  Fact Sheet for Healthcare Providers:  SeriousBroker.ithttps://www.fda.gov/media/152162/download  This test is no                          t yet approved or cleared by the [place] FDA and  has been authorized for detection and/or diagnosis of SARS-CoV-2 by FDA under an Emergency Use Authorization (EUA). This EUA will remain  in effect (meaning this test can be used) for the duration of the COVID-19 declaration under Section 564(b)(1) of the Act, 21 U.S.C.section 360bbb-3(b)(1), unless the authorization is terminated  or revoked sooner.       Influenza A by PCR 10/23/2021 NEGATIVE  NEGATIVE Final   Influenza B by PCR 10/23/2021 NEGATIVE  NEGATIVE Final   Comment: (NOTE) The Xpert Xpress SARS-CoV-2/FLU/RSV plus assay is intended as an aid in the diagnosis of influenza from Nasopharyngeal swab specimens and should not be used as a sole basis for treatment. Nasal washings and aspirates are unacceptable for  Xpert Xpress SARS-CoV-2/FLU/RSV testing.  Fact Sheet for Patients: BloggerCourse.comhttps://www.fda.gov/media/152166/download  Fact Sheet for Healthcare Providers: SeriousBroker.ithttps://www.fda.gov/media/152162/download  This test is not yet approved or cleared by the [place] FDA and has been authorized for detection and/or diagnosis of SARS-CoV-2 by FDA under an Emergency Use Authorization (EUA). This EUA will remain in effect (meaning this test can be used) for the duration of the COVID-19 declaration under Section 564(b)(1) of the Act, 21 U.S.C. section 360bbb-3(b)(1), unless the authorization is terminated or revoked.  Performed at [facility], [address] [place], [place] [address]    Sodium 10/23/2021 133 (L)  135 - 145 mmol/L Final   Potassium 10/23/2021 3.4 (L)  3.5 - 5.1 mmol/L Final   Chloride 10/23/2021 102  98 - 111 mmol/L Final   CO2 10/23/2021 23  22 - 32 mmol/L Final   Glucose, Bld 10/23/2021 109 (H)  70 - 99 mg/dL Final   Glucose reference range applies only to samples taken after fasting for at least 8 hours.   BUN 10/23/2021 8  6 - 20 mg/dL Final   Creatinine, Ser 10/23/2021 0.71  0.44 - 1.00 mg/dL Final   Calcium 60/45/409804/06/2022 9.2  8.9 - 10.3 mg/dL Final   Total Protein 11/91/478204/06/2022 8.3 (H)  6.5 - 8.1 g/dL Final   Albumin 95/62/130804/06/2022 4.3  3.5 - 5.0 g/dL Final   AST 65/78/469604/06/2022 15  15 - 41 U/L Final   ALT 10/23/2021 10  0 - 44 U/L Final   Alkaline Phosphatase 10/23/2021 57  38 - 126 U/L Final   Total Bilirubin 10/23/2021 0.4  0.3 - 1.2 mg/dL Final   GFR, Estimated 10/23/2021 >60  >60 mL/min Final   Comment: (NOTE) Calculated using the CKD-EPI Creatinine Equation (2021)    Anion gap 10/23/2021 8  5 - 15 Final   Performed at [facility], [address] [place], [place] [address]   Alcohol, Ethyl (B) 10/23/2021 <10  <10 mg/dL Final   Comment: (NOTE) Lowest detectable limit for serum alcohol is 10 mg/dL.  For medical purposes only. Performed at [facility], [address] [place], [place] [address]    Tricyclic, Ur Screen 10/23/2021 NONE DETECTED  NONE DETECTED Final   Amphetamines, Ur Screen 10/23/2021 POSITIVE (A)  NONE DETECTED Final   MDMA (Ecstasy)Ur Screen 10/23/2021 NONE DETECTED  NONE DETECTED Final   Cocaine Metabolite,Ur [place] 10/23/2021 POSITIVE (A)  NONE DETECTED Final   Opiate, Ur Screen 10/23/2021 POSITIVE (A)  NONE DETECTED Final   Phencyclidine (PCP) Ur S 10/23/2021 NONE DETECTED  NONE DETECTED Final   Cannabinoid 50 Ng, Ur [place] 10/23/2021 POSITIVE (A)  NONE DETECTED Final   Barbiturates, Ur Screen 10/23/2021 NONE DETECTED  NONE DETECTED Final   Benzodiazepine, Ur Scrn 10/23/2021 POSITIVE (A)  NONE DETECTED Final   Methadone Scn, Ur 10/23/2021 NONE DETECTED  NONE DETECTED Final   Comment: (NOTE) Tricyclics + metabolites, urine    Cutoff 1000 ng/mL Amphetamines + metabolites, urine  Cutoff 1000 ng/mL MDMA (Ecstasy), urine              Cutoff 500 ng/mL Cocaine Metabolite, urine          Cutoff 300 ng/mL Opiate + metabolites, urine        Cutoff 300 ng/mL Phencyclidine (PCP), urine         Cutoff 25 ng/mL Cannabinoid, urine                 Cutoff 50 ng/mL Barbiturates + metabolites, urine  Cutoff 200 ng/mL Benzodiazepine, urine              Cutoff 200 ng/mL Methadone, urine                   Cutoff 300 ng/mL  The urine drug screen provides only a preliminary, unconfirmed analytical test result and should not be used for non-medical purposes. Clinical consideration and professional judgment should be applied to any positive drug screen result due to possible interfering substances. A more specific alternate chemical method must be used in order to obtain a confirmed analytical result. Gas chromatography / mass spectrometry (GC/MS) is the preferred confirm                          atory method. Performed at [facility], [address] [place], [place] [address]    WBC 10/23/2021 8.7  4.0 - 10.5 K/uL Final    RBC 10/23/2021 4.11  3.87 - 5.11 MIL/uL Final   Hemoglobin 10/23/2021 13.6  12.0 - 15.0 g/dL Final   HCT 21/30/8657 40.5  36.0 - 46.0 % Final   MCV 10/23/2021 98.5  80.0 - 100.0 fL Final   MCH 10/23/2021 33.1  26.0 - 34.0 pg Final   MCHC 10/23/2021 33.6  30.0 - 36.0 g/dL Final   RDW 84/69/6295 13.3  11.5 - 15.5 % Final   Platelets 10/23/2021 421 (H)  150 - 400 K/uL Final   nRBC 10/23/2021 0.0  0.0 - 0.2 % Final   Neutrophils Relative % 10/23/2021 70  % Final   Neutro Abs 10/23/2021 6.0  1.7 - 7.7 K/uL Final   Lymphocytes Relative 10/23/2021 25  % Final   Lymphs Abs 10/23/2021 2.2  0.7 - 4.0 K/uL Final   Monocytes Relative 10/23/2021 5  % Final   Monocytes Absolute 10/23/2021 0.5  0.1 - 1.0 K/uL Final   Eosinophils Relative 10/23/2021 0  % Final   Eosinophils Absolute 10/23/2021 0.0  0.0 - 0.5 K/uL Final   Basophils Relative 10/23/2021 0  % Final   Basophils Absolute 10/23/2021 0.0  0.0 - 0.1 K/uL Final   Immature Granulocytes 10/23/2021 0  % Final   Abs Immature Granulocytes 10/23/2021 0.02  0.00 - 0.07 K/uL Final   Performed at [facility], [address] [place], [place] [address]   Acetaminophen (Tylenol), Serum 10/23/2021 <10 (  L)  10 - 30 ug/mL Final   Comment: (NOTE) Therapeutic concentrations vary significantly. A range of 10-30 ug/mL  may be an effective concentration for many patients. However, some  are best treated at concentrations outside of this range. Acetaminophen concentrations >150 ug/mL at 4 hours after ingestion  and >50 ug/mL at 12 hours after ingestion are often associated with  toxic reactions.  Performed at [facility], [address] [place], [place] [address]    Troponin I (High Sensitivity) 10/23/2021 176 (HH)  <18 ng/L Final   Comment: CRITICAL RESULT CALLED TO, READ BACK BY AND VERIFIED WITH REINA GALIJOUR 10/23/21 1442 KLW (NOTE) Elevated high sensitivity troponin I (hsTnI) values and significant  changes across serial  measurements may suggest ACS but many other  chronic and acute conditions are known to elevate hsTnI results.  Refer to the "Links" section for chest pain algorithms and additional  guidance. Performed at [facility], [address] [place], [place] [address]    Total CK 10/23/2021 68  38 - 234 U/L Final   Performed at [facility], [address] [place], [place] [address]   Magnesium 10/23/2021 2.1  1.7 - 2.4 mg/dL Final   Performed at [facility], [address] [place], [place] [address]   Salicylate Lvl 10/23/2021 <7.0 (L)  7.0 - 30.0 mg/dL Final   Performed at [facility], [address] [place], [place] [address]   Troponin I (High Sensitivity) 10/23/2021 181 (HH)  <18 ng/L Final   Comment: CRITICAL VALUE NOTED. VALUE IS CONSISTENT WITH PREVIOUSLY REPORTED/CALLED VALUE KLW (NOTE) Elevated high sensitivity troponin I (hsTnI) values and significant  changes across serial measurements may suggest ACS but many other  chronic and acute conditions are known to elevate hsTnI results.  Refer to the "Links" section for chest pain algorithms and additional  guidance. Performed at [facility], [address] [place], [place] [address]    Lipase 10/23/2021 31  11 - 51 U/L Final   Performed at [facility], [address] [place], [place] [address]   aPTT 10/23/2021 30  24 - 36 seconds Final   Performed at [facility], [address] [place], [place] [address]   Prothrombin Time 10/23/2021 13.7  11.4 - 15.2 seconds Final   INR 10/23/2021 1.1  0.8 - 1.2 Final   Comment: (NOTE) INR goal varies based on device and disease states. Performed at [facility], [address] [place], [place] [address]    Heparin Unfractionated 10/23/2021 0.15 (L)  0.30 - 0.70 IU/mL Final   Comment: (NOTE) The clinical reportable range upper limit is being lowered to >1.10 to align with the FDA approved guidance for  the current laboratory assay.  If heparin results are below expected values, and patient dosage has  been confirmed, suggest follow up testing of antithrombin III levels. Performed at [facility], [address] [place], [place] [address]    WBC 10/24/2021 7.0  4.0 - 10.5 K/uL Final   RBC 10/24/2021 4.62  3.87 - 5.11 MIL/uL Final   Hemoglobin 10/24/2021 15.3 (H)  12.0 - 15.0 g/dL Final   HCT 40/34/7425 46.0  36.0 - 46.0 % Final   MCV 10/24/2021 99.6  80.0 - 100.0 fL Final   MCH 10/24/2021 33.1  26.0 - 34.0 pg Final   MCHC 10/24/2021 33.3  30.0 - 36.0 g/dL Final   RDW 95/63/8756 13.2  11.5 - 15.5 % Final  Platelets 10/24/2021 413 (H)  150 - 400 K/uL Final   nRBC 10/24/2021 0.0  0.0 - 0.2 % Final   Performed at [facility], [address] [place], [place] [address]   Cholesterol 10/24/2021 176  0 - 200 mg/dL Final   Triglycerides 47/82/9562 115  <150 mg/dL Final   HDL 13/02/6577 39 (L)  >40 mg/dL Final   Total CHOL/HDL Ratio 10/24/2021 4.5  RATIO Final   VLDL 10/24/2021 23  0 - 40 mg/dL Final   LDL Cholesterol 10/24/2021 114 (H)  0 - 99 mg/dL Final   Comment:        Total Cholesterol/HDL:CHD Risk Coronary Heart Disease Risk Table                     Men   Women  1/2 Average Risk   3.4   3.3  Average Risk       5.0   4.4  2 X Average Risk   9.6   7.1  3 X Average Risk  23.4   11.0        Use the calculated Patient Ratio above and the CHD Risk Table to determine the patient's CHD Risk.        ATP III CLASSIFICATION (LDL):  <100     mg/dL   Optimal  469-629  mg/dL   Near or Above                    Optimal  130-159  mg/dL   Borderline  528-413  mg/dL   High  >244     mg/dL   Very High Performed at [facility], [address] [place], [place] [address]    Weight 10/24/2021 1,920  oz Final   Height 10/24/2021 64  in Final   BP 10/24/2021 96/72  mmHg Final   Ao pk vel 10/24/2021 1.01  m/s Final   AV Area VTI 10/24/2021 3.11  cm2 Final    AR max vel 10/24/2021 3.05  cm2 Final   AV Mean grad 10/24/2021 2.0  mmHg Final   AV Peak grad 10/24/2021 4.1  mmHg Final   S' Lateral 10/24/2021 2.75  cm Final   AV Area mean vel 10/24/2021 2.84  cm2 Final   Area-P 1/2 10/24/2021 4.06  cm2 Final   MV VTI 10/24/2021 3.35  cm2 Final   Troponin I (High Sensitivity) 10/23/2021 8  <18 ng/L Final   Comment: (NOTE) Elevated high sensitivity troponin I (hsTnI) values and significant  changes across serial measurements may suggest ACS but many other  chronic and acute conditions are known to elevate hsTnI results.  Refer to the "Links" section for chest pain algorithms and additional  guidance. Performed at [facility], [address] [place], [place] [address]    Troponin I (High Sensitivity) 10/23/2021 127 (HH)  <18 ng/L Final   Comment: CRITICAL RESULT CALLED TO, READ BACK BY AND VERIFIED WITH ASHLEIGH WRIGHT @2103  ON 10/23/21 SKL (NOTE) Elevated high sensitivity troponin I (hsTnI) values and significant  changes across serial measurements may suggest ACS but many other  chronic and acute conditions are known to elevate hsTnI results.  Refer to the "Links" section for chest pain algorithms and additional  guidance. Performed at [facility], [address] [place], [place] [address]    Heparin Unfractionated 10/24/2021 0.20 (L)  0.30 - 0.70 IU/mL Final   Comment: (NOTE) The clinical reportable range upper limit is being  lowered to >1.10 to align with the FDA approved guidance for the current laboratory assay.  If heparin results are below expected values, and patient dosage has  been confirmed, suggest follow up testing of antithrombin III levels. Performed at [facility] [address] [place], [place] [address]    Heparin Unfractionated 10/24/2021 <0.10 (L)  0.30 - 0.70 IU/mL Final   Comment: REPEATED TO VERIFY Performed at [facility], [address] [place], [place] [address]    Admission on 06/16/2021, Discharged on 06/16/2021  Component Date Value Ref Range Status   Preg Test, Ur 06/16/2021 NEGATIVE  NEGATIVE Final   Comment:        THE SENSITIVITY OF THIS METHODOLOGY IS >24 mIU/mL     Allergies: Bactrim, Flagyl [metronidazole hcl], Lithium, Onion, Other, Peanuts [nuts], Sulfamethoxazole-trimethoprim, Latex, and Tape  PTA Medications: (Not in a hospital admission)   Long Term Goals: Improvement in symptoms so as ready for discharge  Short Term Goals: Ability to identify changes in lifestyle to reduce recurrence of condition will improve, Ability to verbalize feelings will improve, Ability to disclose and discuss suicidal ideas, Ability to demonstrate self-control will improve, Ability to identify and develop effective coping behaviors will improve, Ability to maintain clinical measurements within normal limits will improve, Compliance with prescribed medications will improve, and Ability to identify triggers associated with substance abuse/mental health issues will improve  Medical Decision Making  Patient presents to the [facility] requesting opioid detox.  She is passively suicidal with no plan, intent, or access to means.  She meets criteria for treatment in the [facility].    Recommendations  Based on my evaluation the patient does not appear to have an emergency medical condition.  Disposition: Patient meets criteria for treatment in the [facility].  Social work notified patient is requesting referral to residential substance abuse treatment, specifically [facility].   EKG ordered  COWS protocol-clonidine taper  Medications   acetaminophen (TYLENOL) tablet 650 mg   alum & mag hydroxide-simeth (MAALOX/MYLANTA) 200-200-20 MG/5ML suspension 30 mL   magnesium hydroxide (MILK OF MAGNESIA) suspension 30 mL   traZODone (DESYREL) tablet 50 mg   dicyclomine (BENTYL) tablet 20 mg   hydrOXYzine (ATARAX) tablet 25 mg   loperamide (IMODIUM) capsule 2-4 mg   methocarbamol  (ROBAXIN) tablet 500 mg   naproxen (NAPROSYN) tablet 500 mg   ondansetron (ZOFRAN-ODT) disintegrating tablet 4 mg       Clonidine taper   Lab Orders         Resp Panel by RT-PCR (Flu A&B, Covid) Nasopharyngeal Swab         CBC with Differential/Platelet         Comprehensive metabolic panel         Hemoglobin A1c         Magnesium         Ethanol         Lipid panel         TSH         RPR         Urinalysis, Complete w Microscopic Urine, Clean Catch         Pregnancy, urine         POCT Urine Drug Screen - (I-Screen)         POC SARS Coronavirus 2 Ag-ED - Nasal Swab       Ardis Hughs, NP 12/02/21  2:55 PM

## 2021-12-02 NOTE — ED Notes (Signed)
Patient is currently resting with eyes closed. No S/S of distress. Respirations are even and unlabored. Will continue to monitor for safety.

## 2021-12-02 NOTE — ED Triage Notes (Signed)
Ems brings pt in for drug use 4 hours ago. Pt reports she has been using marijuana, meth, and cocaine. States she has also been drinking alcohol. Pt reports feeling anxious and that she just needs to be checked out.

## 2021-12-02 NOTE — Progress Notes (Signed)
Substance abuse and shelter resources have been provided to this pt before. Pt was informed previously she will have to contact inpatient substance abuse agencies to find out available beds. CSW attached substance abuse and shelter resources again to pt's AVS.   Valentina Shaggy.Dewane Timson, MSW, LCSWA [facility]  Transitions of Care Clinical Social Worker I Direct Dial: [phone]  Fax: [phone] [email or web address]

## 2021-12-02 NOTE — ED Notes (Signed)
Pt arrived to [facility] unit

## 2021-12-02 NOTE — BH Assessment (Signed)
Patient is a 41 year old female that presents this date voluntary after being discharged from [facility] earlier this date seen for chest tightness and SOB. Patient reported ongoing SA use to that provider at [facility] was medically cleared and discharged with resources. ED notes are below. Patient on discharge contacted [facility] to transport to [facility] requesting "rehab" for ongoing SA issues. Patient has an extensive history significant for polysubstance use and states she has been using various amounts daily of opiates and methamphetamines. Patient denies any S/I, H/I or AVH.   [place] LCSW writes today at 1030 at [facility].   Substance abuse and shelter resources have been provided to this pt before. Pt was informed previously she will have to contact inpatient substance abuse agencies to find out available beds. CSW attached substance abuse and shelter resources again to pt's AVS.     EDP writes at [facility] today at 0918: SHAQUETTA ARCOS is a 41 y.o. female.  She has a history of substance abuse.  Complaining of being homeless and living in her car, using cocaine and methamphetamines alcohol and marijuana.  States she has not slept in days and would like to be put away.  Denies any medical complaints.

## 2021-12-02 NOTE — ED Notes (Signed)
Report provided to [facility], RN at [facility]. Pt transferred to other side. Safety maintained.

## 2021-12-02 NOTE — ED Notes (Signed)
Patient woke up to take her evening medications, she had her scheduled clonidine along with hydroxyzine, trazodone, and naproxen. Pain in her ribs 7 out of 10. Will continue to monitor for safety.

## 2021-12-02 NOTE — ED Provider Notes (Signed)
[facility]-EM[facility]GENCY DEPT Provider Note   CSN: [id number] Arrival date & time: 12/02/21  6546     History  Chief Complaint  Patient presents with   Anxiety    Katherine Burgess is a 41 y.o. female.  She has a history of substance abuse.  Complaining of being homeless and living in her car, using cocaine and methamphetamines alcohol and marijuana.  States she has not slept in days and would like to be "put away."  Denies any medical complaints. No SI.   The history is provided by the patient.  Drug Problem This is a chronic problem. The problem has not changed since onset.Pertinent negatives include no chest pain, no abdominal pain, no headaches and no shortness of breath. Nothing aggravates the symptoms. Nothing relieves the symptoms. She has tried nothing for the symptoms. The treatment provided no relief.      Home Medications Prior to Admission medications   Medication Sig Start Date End Date Taking? Authorizing Provider  albuterol (VENTOLIN HFA) 108 (90 Base) MCG/ACT inhaler Inhale 2 puffs into the lungs every 6 (six) hours as needed for wheezing or shortness of breath. Patient not taking: Reported on 10/24/2021    [provider]      Allergies    Bactrim, Flagyl [metronidazole hcl], Lithium, Onion, Other, Peanuts [nuts], Sulfamethoxazole-trimethoprim, Latex, and Tape    Review of Systems   Review of Systems  Respiratory:  Negative for shortness of breath.   Cardiovascular:  Negative for chest pain.  Gastrointestinal:  Negative for abdominal pain.  Neurological:  Negative for headaches.  Psychiatric/Behavioral:  Positive for sleep disturbance.    Physical Exam Updated Vital Signs BP 135/77   Pulse 93   Temp 98 F (36.7 C)   Resp 19   SpO2 100%  Physical Exam Vitals and nursing note reviewed.  Constitutional:      General: She is not in acute distress.    Appearance: Normal appearance. She is well-developed.  HENT:     Head:  Normocephalic and atraumatic.  Eyes:     Conjunctiva/sclera: Conjunctivae normal.  Cardiovascular:     Rate and Rhythm: Normal rate and regular rhythm.     Heart sounds: No murmur heard. Pulmonary:     Effort: Pulmonary effort is normal. No respiratory distress.     Breath sounds: Normal breath sounds.  Abdominal:     Palpations: Abdomen is soft.     Tenderness: There is no abdominal tenderness.  Musculoskeletal:        General: No swelling.     Cervical back: Neck supple.  Skin:    General: Skin is warm and dry.     Capillary Refill: Capillary refill takes less than 2 seconds.  Neurological:     General: No focal deficit present.     Mental Status: She is alert.    ED Results / Procedures / Treatments   Labs (all labs ordered are listed, but only abnormal results are displayed) Labs Reviewed - No data to display  EKG None  Radiology No results found.  Procedures Procedures    Medications Ordered in ED Medications - No data to display  ED Course/ Medical Decision Making/ A&P Clinical Course as of 12/02/21 1912  Mon Dec 02, 2021  1102 Social work gave patient resources for outpatient detox and shelters. [MB]    Clinical Course User Index [MB] Terrilee Files, MD  Medical Decision Making Risk Prescription drug management.  41 year old female with history of polysubstance abuse homelessness here with complaint of unable to sleep increase drug use.  She is looking for resources for detox.  Social work consulted and states that she is given the patient resources multiple times.  Resources again given.  Patient given dose of Ativan for her anxiety.  Do not feel comfortable prescribing her this is an outpatient due to her significant abuse history.  Return instructions discussed        Final Clinical Impression(s) / ED Diagnoses Final diagnoses:  Polysubstance abuse [facility])    Rx / DC Orders ED Discharge Orders     None          Terrilee Files, MD 12/02/21 [phone]

## 2021-12-03 DIAGNOSIS — F3130 Bipolar disorder, current episode depressed, mild or moderate severity, unspecified: Secondary | ICD-10-CM | POA: Diagnosis present

## 2021-12-03 LAB — GC/CHLAMYDIA PROBE AMP (~~LOC~~) NOT AT ARMC
Chlamydia: NEGATIVE
Comment: NEGATIVE
Comment: NORMAL
Neisseria Gonorrhea: NEGATIVE

## 2021-12-03 LAB — RPR: RPR Ser Ql: NONREACTIVE

## 2021-12-03 MED ORDER — TRAZODONE HCL 100 MG PO TABS
100.0000 mg | ORAL_TABLET | Freq: Every evening | ORAL | Status: DC | PRN
  Administered 2021-12-03: 100 mg via ORAL
  Filled 2021-12-03: qty 1

## 2021-12-03 MED ORDER — CITALOPRAM HYDROBROMIDE 10 MG PO TABS
10.0000 mg | ORAL_TABLET | Freq: Every day | ORAL | Status: DC
  Administered 2021-12-04: 10 mg via ORAL
  Filled 2021-12-03: qty 1

## 2021-12-03 MED ORDER — ARIPIPRAZOLE 5 MG PO TABS
5.0000 mg | ORAL_TABLET | Freq: Once | ORAL | Status: AC
  Administered 2021-12-03: 5 mg via ORAL
  Filled 2021-12-03: qty 1

## 2021-12-03 MED ORDER — ARIPIPRAZOLE 5 MG PO TABS
5.0000 mg | ORAL_TABLET | Freq: Every day | ORAL | Status: DC
  Administered 2021-12-04: 5 mg via ORAL
  Filled 2021-12-03: qty 1

## 2021-12-03 MED ORDER — CITALOPRAM HYDROBROMIDE 10 MG PO TABS
10.0000 mg | ORAL_TABLET | Freq: Once | ORAL | Status: AC
  Administered 2021-12-03: 10 mg via ORAL
  Filled 2021-12-03: qty 1

## 2021-12-03 NOTE — ED Notes (Signed)
Received patient this PM. Patient in her room resting. Patient respirations are even and unlabored. Will continue to monitor for safety.

## 2021-12-03 NOTE — ED Notes (Signed)
Pt denied SI, HI and AVH. Accepted morning meds w/o difficulty. PRN Tylenol admin per request. Breakfast provided. Informed Pt that meals are served in the day room, she was under the impression that meals are brought to the room. Safety maintained and will continue to monitor.

## 2021-12-03 NOTE — ED Notes (Signed)
Pt was given Gatorade and meal

## 2021-12-03 NOTE — ED Notes (Signed)
Patient continues to rest with no sxs of distress - will continue to monitor for safety ?

## 2021-12-03 NOTE — ED Notes (Addendum)
Notified that breakfast is ready and asked her to come to dayroom for her food

## 2021-12-03 NOTE — ED Provider Notes (Signed)
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 Cocaine and metabolites        300 THC                            50 Performed at [facility], [address] [place], [place] [address]    Salicylate Lvl 10/25/2021 <7.0 (L)  7.0 - 30.0 mg/dL Final   Performed at [facility], [address], [place], [place] [address]   Acetaminophen (Tylenol), Serum 10/25/2021 <10 (L)  10 - 30 ug/mL Final   Comment: (NOTE) Therapeutic concentrations vary significantly. A range of  10-30 ug/mL  may  be an effective concentration for many patients. However, some  are best treated at concentrations outside of this range. Acetaminophen concentrations >150 ug/mL at 4 hours after ingestion  and >50 ug/mL at 12 hours after ingestion are often associated with  toxic reactions.  Performed at [facility], [address] [place], [place] [address]    I-stat hCG, quantitative 10/25/2021 <5.0  <5 mIU/mL Final   Comment 3 10/25/2021          Final   Comment:   GEST. AGE      CONC.  (mIU/mL)   <=1 WEEK        5 - 50     2 WEEKS       50 - 500     3 WEEKS       100 - 10,000     4 WEEKS     1,000 - 30,000        FEMALE AND NON-PREGNANT FEMALE:     LESS THAN 5 mIU/mL    Troponin I (High Sensitivity) 10/25/2021 34 (H)  <18 ng/L Final   Comment: (NOTE) Elevated high sensitivity troponin I (hsTnI) values and significant  changes across serial measurements may suggest ACS but many other  chronic and acute conditions are known to elevate hsTnI results.  Refer to the "Links" section for chest pain algorithms and additional  guidance. Performed at [facility], [address] [place], [place] [address]   Admission on 10/24/2021, Discharged on 10/24/2021  Component Date Value Ref Range Status   WBC 10/24/2021 8.0  4.0 - 10.5 K/uL Final   RBC 10/24/2021 4.55  3.87 - 5.11 MIL/uL Final   Hemoglobin 10/24/2021 15.1 (H)  12.0 - 15.0 g/dL Final   HCT 56/21/3086 44.9  36.0 - 46.0 % Final   MCV 10/24/2021 98.7  80.0 - 100.0 fL Final   MCH 10/24/2021 33.2  26.0 - 34.0 pg Final   MCHC 10/24/2021 33.6  30.0 - 36.0 g/dL Final   RDW 57/84/6962 13.2  11.5 - 15.5 % Final   Platelets 10/24/2021 492 (H)  150 - 400 K/uL Final   nRBC 10/24/2021 0.0  0.0 - 0.2 % Final   Neutrophils Relative % 10/24/2021 48  % Final   Neutro Abs 10/24/2021 3.8  1.7 - 7.7 K/uL Final   Lymphocytes Relative 10/24/2021 45  % Final   Lymphs Abs 10/24/2021 3.6  0.7 - 4.0 K/uL Final   Monocytes Relative  10/24/2021 7  % Final   Monocytes Absolute 10/24/2021 0.5  0.1 - 1.0 K/uL Final   Eosinophils Relative 10/24/2021 0  % Final   Eosinophils Absolute 10/24/2021 0.0  0.0 - 0.5 K/uL Final   Basophils Relative 10/24/2021 0  % Final   Basophils Absolute 10/24/2021 0.0  0.0 - 0.1 K/uL Final   Immature Granulocytes 10/24/2021 0  % Final   Abs Immature Granulocytes 10/24/2021 0.02  0.00 - 0.07 K/uL Final   Performed at [facility], [address] [place], [place] [address]   Sodium 10/24/2021 134 (L)  135 - 145 mmol/L Final   Potassium 10/24/2021 4.2  3.5 - 5.1 mmol/L Final   Chloride 10/24/2021 99  98 - 111 mmol/L Final   CO2 10/24/2021 19 (L)  22 - 32 mmol/L Final   Glucose, Bld 10/24/2021 87  70 - 99 mg/dL Final   Glucose reference range applies only to samples taken after fasting for at least 8 hours.   BUN  10/24/2021 16  6 - 20 mg/dL Final   Creatinine, Ser 10/24/2021 1.12 (H)  0.44 - 1.00 mg/dL Final   Calcium 16/04/9603 9.9  8.9 - 10.3 mg/dL Final   Total Protein 54/03/8118 8.7 (H)  6.5 - 8.1 g/dL Final   Albumin 14/78/2956 4.6  3.5 - 5.0 g/dL Final   AST 21/30/8657 26  15 - 41 U/L Final   ALT 10/24/2021 10  0 - 44 U/L Final   Alkaline Phosphatase 10/24/2021 61  38 - 126 U/L Final   Total Bilirubin 10/24/2021 0.8  0.3 - 1.2 mg/dL Final   GFR, Estimated 10/24/2021 >60  >60 mL/min Final   Comment: (NOTE) Calculated using the CKD-EPI Creatinine Equation (2021)    Anion gap 10/24/2021 16 (H)  5 - 15 Final   Performed at [facility], [address] [place], [place] [address]   Troponin I (High Sensitivity) 10/24/2021 40 (H)  <18 ng/L Final   Comment: (NOTE) Elevated high sensitivity troponin I (hsTnI) values and significant  changes across serial measurements may suggest ACS but many other  chronic and acute conditions are known to elevate hsTnI results.  Refer to the "Links" section for chest pain algorithms and additional  guidance. Performed at [facility], [address] [place], [place] [address]    D-Dimer, Quant 10/24/2021 0.37  0.00 - 0.50 ug/mL-FEU Final   Comment: (NOTE) At the manufacturer cut-off value of 0.5 g/mL FEU, this assay has a negative predictive value of 95-100%.This assay is intended for use in conjunction with a clinical pretest probability (PTP) assessment model to exclude pulmonary embolism (PE) and deep venous thrombosis (DVT) in outpatients suspected of PE or DVT. Results should be correlated with clinical presentation. Performed at [facility], [address] [place], [place] [address]    SARS Coronavirus 2 by RT PCR 10/24/2021 NEGATIVE  NEGATIVE Final   Comment: (NOTE) SARS-CoV-2 target nucleic acids are NOT DETECTED.  The SARS-CoV-2 RNA is generally detectable in upper respiratory specimens during the acute phase of infection. The lowest concentration of SARS-CoV-2 viral copies this assay can detect is 138 copies/mL. A negative result does not preclude SARS-Cov-2 infection and should not be used as the sole basis for treatment or other patient management decisions. A negative result may occur with  improper specimen collection/handling, submission of specimen other than nasopharyngeal swab, presence of viral mutation(s) within the areas targeted by this assay, and inadequate number of viral copies(<138 copies/mL). A negative result must be combined with clinical observations, patient history, and epidemiological information. The expected result is Negative.  Fact Sheet for Patients:  BloggerCourse.com  Fact Sheet for Healthcare Providers:  SeriousBroker.it  This test is no                          t yet approved or cleared by the [place] FDA and  has been authorized for detection and/or diagnosis of SARS-CoV-2 by FDA under an Emergency Use Authorization (EUA). This EUA will remain  in effect (meaning this test can be  used) for the duration of the COVID-19 declaration under Section 564(b)(1) of the Act, 21 U.S.C.section 360bbb-3(b)(1), unless the authorization is terminated  or revoked sooner.       Influenza A by PCR 10/24/2021 NEGATIVE  NEGATIVE Final   Influenza B by PCR 10/24/2021 NEGATIVE  NEGATIVE Final   Comment: (NOTE) The Xpert Xpress SARS-CoV-2/FLU/RSV plus assay is intended as an aid in  the diagnosis of influenza from Nasopharyngeal swab specimens and should not be used as a sole basis for treatment. Nasal washings and aspirates are unacceptable for Xpert Xpress SARS-CoV-2/FLU/RSV testing.  Fact Sheet for Patients: BloggerCourse.com  Fact Sheet for Healthcare Providers: SeriousBroker.it  This test is not yet approved or cleared by the [place] FDA and has been authorized for detection and/or diagnosis of SARS-CoV-2 by FDA under an Emergency Use Authorization (EUA). This EUA will remain in effect (meaning this test can be used) for the duration of the COVID-19 declaration under Section 564(b)(1) of the Act, 21 U.S.C. section 360bbb-3(b)(1), unless the authorization is terminated or revoked.  Performed at [facility], [address] [place], [place] [address]    Procalcitonin 10/24/2021 <0.10  ng/mL Final   Comment:        Interpretation: PCT (Procalcitonin) <= 0.5 ng/mL: Systemic infection (sepsis) is not likely. Local bacterial infection is possible. (NOTE)       Sepsis PCT Algorithm           Lower Respiratory Tract                                      Infection PCT Algorithm    ----------------------------     ----------------------------         PCT < 0.25 ng/mL                PCT < 0.10 ng/mL          Strongly encourage             Strongly discourage   discontinuation of antibiotics    initiation of antibiotics    ----------------------------     -----------------------------       PCT 0.25 - 0.50  ng/mL            PCT 0.10 - 0.25 ng/mL               OR       >80% decrease in PCT            Discourage initiation of                                            antibiotics      Encourage discontinuation           of antibiotics    ----------------------------     -----------------------------         PCT >= 0.50 ng/mL              PCT 0.26 - 0.50 ng/mL               AND                                 <80% decrease in PCT             Encourage initiation of                                             antibiotics       Encourage continuation  of antibiotics    ----------------------------     -----------------------------        PCT >= 0.50 ng/mL                  PCT > 0.50 ng/mL               AND         increase in PCT                  Strongly encourage                                      initiation of antibiotics    Strongly encourage escalation           of antibiotics                                     -----------------------------                                           PCT <= 0.25 ng/mL                                                 OR                                        > 80% decrease in PCT                                      Discontinue / Do not initiate                                             antibiotics  Performed at [facility], [address] [place], [place] [address]    hCG, Conley Rolls, Sharene Butters, S 10/24/2021 <1  <5 mIU/mL Final   Comment:          GEST. AGE      CONC.  (mIU/mL)   <=1 WEEK        5 - 50     2 WEEKS       50 - 500     3 WEEKS       100 - 10,000     4 WEEKS     1,000 - 30,000     5 WEEKS     3,500 - 115,000   6-8 WEEKS     12,000 - 270,000    12 WEEKS     15,000 - 220,000        FEMALE AND NON-PREGNANT FEMALE:     LESS THAN 5 mIU/mL Performed at [facility], [address] [place], [place] [address]    Magnesium 10/24/2021 2.3  1.7 - 2.4 mg/dL Final   Performed at [facility], [address] [place], [place] [address]   Troponin I (High Sensitivity) 10/24/2021 43 (H)  <18 ng/L Final   Comment: (NOTE) Elevated high sensitivity troponin I (hsTnI) values and significant  changes across serial measurements may suggest ACS but many other  chronic and acute conditions are known to elevate hsTnI results.  Refer to the "Links" section for chest pain algorithms and additional  guidance. Performed at [facility], [address] [place], [place] [address]   Admission on 10/23/2021, Discharged on 10/24/2021  Component Date Value Ref Range Status   SARS Coronavirus 2 by RT PCR 10/23/2021 NEGATIVE  NEGATIVE Final   Comment: (NOTE) SARS-CoV-2 target nucleic acids are NOT DETECTED.  The SARS-CoV-2 RNA is generally detectable in upper respiratory specimens during the acute phase of infection. The lowest concentration of SARS-CoV-2 viral copies this assay can detect is 138 copies/mL. A negative result does not preclude SARS-Cov-2 infection and should not be used as the sole basis for treatment or other patient management decisions. A negative result may occur with  improper specimen collection/handling, submission of specimen other than nasopharyngeal swab, presence of viral mutation(s) within the areas targeted by this assay, and inadequate number of viral copies(<138 copies/mL). A negative result must be combined with clinical observations, patient history, and epidemiological information. The expected result is Negative.  Fact Sheet for Patients:  BloggerCourse.com  Fact Sheet for Healthcare Providers:  SeriousBroker.it  This test is no                          t yet approved or cleared by the [place] FDA and  has been authorized for detection and/or diagnosis of SARS-CoV-2 by FDA under an Emergency Use Authorization (EUA). This EUA will remain  in effect (meaning this test can be used) for the  duration of the COVID-19 declaration under Section 564(b)(1) of the Act, 21 U.S.C.section 360bbb-3(b)(1), unless the authorization is terminated  or revoked sooner.       Influenza A by PCR 10/23/2021 NEGATIVE  NEGATIVE Final   Influenza B by PCR 10/23/2021 NEGATIVE  NEGATIVE Final   Comment: (NOTE) The Xpert Xpress SARS-CoV-2/FLU/RSV plus assay is intended as an aid in the diagnosis of influenza from Nasopharyngeal swab specimens and should not be used as a sole basis for treatment. Nasal washings and aspirates are unacceptable for Xpert Xpress SARS-CoV-2/FLU/RSV testing.  Fact Sheet for Patients: BloggerCourse.com  Fact Sheet for Healthcare Providers: SeriousBroker.it  This test is not yet approved or cleared by the [place] FDA and has been authorized for detection and/or diagnosis of SARS-CoV-2 by FDA under an Emergency Use Authorization (EUA). This EUA will remain in effect (meaning this test can be used) for the duration of the COVID-19 declaration under Section 564(b)(1) of the Act, 21 U.S.C. section 360bbb-3(b)(1), unless the authorization is terminated or revoked.  Performed at [facility], [address] [place], [place] [address]    Sodium 10/23/2021 133 (L)  135 - 145 mmol/L Final   Potassium 10/23/2021 3.4 (L)  3.5 - 5.1 mmol/L Final   Chloride 10/23/2021 102  98 - 111 mmol/L Final   CO2 10/23/2021 23  22 - 32 mmol/L Final   Glucose, Bld 10/23/2021 109 (H)  70 - 99 mg/dL Final   Glucose reference range applies only to samples taken after fasting for at least 8 hours.   BUN 10/23/2021 8  6 - 20 mg/dL Final  Creatinine, Ser 10/23/2021 0.71  0.44 - 1.00 mg/dL Final   Calcium 16/04/9603 9.2  8.9 - 10.3 mg/dL Final   Total Protein 54/03/8118 8.3 (H)  6.5 - 8.1 g/dL Final   Albumin 14/78/2956 4.3  3.5 - 5.0 g/dL Final   AST 21/30/8657 15  15 - 41 U/L Final   ALT 10/23/2021 10  0 - 44 U/L Final    Alkaline Phosphatase 10/23/2021 57  38 - 126 U/L Final   Total Bilirubin 10/23/2021 0.4  0.3 - 1.2 mg/dL Final   GFR, Estimated 10/23/2021 >60  >60 mL/min Final   Comment: (NOTE) Calculated using the CKD-EPI Creatinine Equation (2021)    Anion gap 10/23/2021 8  5 - 15 Final   Performed at [facility], [address] [place], [place] [address]   Alcohol, Ethyl (B) 10/23/2021 <10  <10 mg/dL Final   Comment: (NOTE) Lowest detectable limit for serum alcohol is 10 mg/dL.  For medical purposes only. Performed at [facility], [address] [place], [place] [address]    Tricyclic, Ur Screen 10/23/2021 NONE DETECTED  NONE DETECTED Final   Amphetamines, Ur Screen 10/23/2021 POSITIVE (A)  NONE DETECTED Final   MDMA (Ecstasy)Ur Screen 10/23/2021 NONE DETECTED  NONE DETECTED Final   Cocaine Metabolite,Ur [place] 10/23/2021 POSITIVE (A)  NONE DETECTED Final   Opiate, Ur Screen 10/23/2021 POSITIVE (A)  NONE DETECTED Final   Phencyclidine (PCP) Ur S 10/23/2021 NONE DETECTED  NONE DETECTED Final   Cannabinoid 50 Ng, Ur [place] 10/23/2021 POSITIVE (A)  NONE DETECTED Final   Barbiturates, Ur Screen 10/23/2021 NONE DETECTED  NONE DETECTED Final   Benzodiazepine, Ur Scrn 10/23/2021 POSITIVE (A)  NONE DETECTED Final   Methadone Scn, Ur 10/23/2021 NONE DETECTED  NONE DETECTED Final   Comment: (NOTE) Tricyclics + metabolites, urine    Cutoff 1000 ng/mL Amphetamines + metabolites, urine  Cutoff 1000 ng/mL MDMA (Ecstasy), urine              Cutoff 500 ng/mL Cocaine Metabolite, urine          Cutoff 300 ng/mL Opiate + metabolites, urine        Cutoff 300 ng/mL Phencyclidine (PCP), urine         Cutoff 25 ng/mL Cannabinoid, urine                 Cutoff 50 ng/mL Barbiturates + metabolites, urine  Cutoff 200 ng/mL Benzodiazepine, urine              Cutoff 200 ng/mL Methadone, urine                   Cutoff 300 ng/mL  The urine drug screen provides only a preliminary, unconfirmed analytical  test result and should not be used for non-medical purposes. Clinical consideration and professional judgment should be applied to any positive drug screen result due to possible interfering substances. A more specific alternate chemical method must be used in order to obtain a confirmed analytical result. Gas chromatography / mass spectrometry (GC/MS) is the preferred confirm                          atory method. Performed at [facility], [address] [place], [place] [address]    WBC 10/23/2021 8.7  4.0 - 10.5 K/uL Final   RBC 10/23/2021 4.11  3.87 - 5.11 MIL/uL Final   Hemoglobin 10/23/2021 13.6  12.0 - 15.0 g/dL Final  HCT 10/23/2021 40.5  36.0 - 46.0 % Final   MCV 10/23/2021 98.5  80.0 - 100.0 fL Final   MCH 10/23/2021 33.1  26.0 - 34.0 pg Final   MCHC 10/23/2021 33.6  30.0 - 36.0 g/dL Final   RDW 34/74/2595 13.3  11.5 - 15.5 % Final   Platelets 10/23/2021 421 (H)  150 - 400 K/uL Final   nRBC 10/23/2021 0.0  0.0 - 0.2 % Final   Neutrophils Relative % 10/23/2021 70  % Final   Neutro Abs 10/23/2021 6.0  1.7 - 7.7 K/uL Final   Lymphocytes Relative 10/23/2021 25  % Final   Lymphs Abs 10/23/2021 2.2  0.7 - 4.0 K/uL Final   Monocytes Relative 10/23/2021 5  % Final   Monocytes Absolute 10/23/2021 0.5  0.1 - 1.0 K/uL Final   Eosinophils Relative 10/23/2021 0  % Final   Eosinophils Absolute 10/23/2021 0.0  0.0 - 0.5 K/uL Final   Basophils Relative 10/23/2021 0  % Final   Basophils Absolute 10/23/2021 0.0  0.0 - 0.1 K/uL Final   Immature Granulocytes 10/23/2021 0  % Final   Abs Immature Granulocytes 10/23/2021 0.02  0.00 - 0.07 K/uL Final   Performed at [facility], [address] [place], [place] [address]   Preg Test, Ur 10/23/2021 Negative  Negative Final   Acetaminophen (Tylenol), Serum 10/23/2021 <10 (L)  10 - 30 ug/mL Final   Comment: (NOTE) Therapeutic concentrations vary significantly. A range of 10-30 ug/mL  may be an effective concentration for  many patients. However, some  are best treated at concentrations outside of this range. Acetaminophen concentrations >150 ug/mL at 4 hours after ingestion  and >50 ug/mL at 12 hours after ingestion are often associated with  toxic reactions.  Performed at [facility], [address] [place], [place] [address]    Troponin I (High Sensitivity) 10/23/2021 176 (HH)  <18 ng/L Final   Comment: CRITICAL RESULT CALLED TO, READ BACK BY AND VERIFIED WITH REINA GALIJOUR 10/23/21 1442 KLW (NOTE) Elevated high sensitivity troponin I (hsTnI) values and significant  changes across serial measurements may suggest ACS but many other  chronic and acute conditions are known to elevate hsTnI results.  Refer to the "Links" section for chest pain algorithms and additional  guidance. Performed at [facility], [address] [place], [place] [address]    Total CK 10/23/2021 68  38 - 234 U/L Final   Performed at [facility], [address] [place], [place] [address]   Magnesium 10/23/2021 2.1  1.7 - 2.4 mg/dL Final   Performed at [facility], [address] [place], [place] [address]   Salicylate Lvl 10/23/2021 <7.0 (L)  7.0 - 30.0 mg/dL Final   Performed at [facility], [address] [place], [place] [address]   Troponin I (High Sensitivity) 10/23/2021 181 (HH)  <18 ng/L Final   Comment: CRITICAL VALUE NOTED. VALUE IS CONSISTENT WITH PREVIOUSLY REPORTED/CALLED VALUE KLW (NOTE) Elevated high sensitivity troponin I (hsTnI) values and significant  changes across serial measurements may suggest ACS but many other  chronic and acute conditions are known to elevate hsTnI results.  Refer to the "Links" section for chest pain algorithms and additional  guidance. Performed at [facility], [address] [place], [place] [address]    Lipase 10/23/2021 31  11 - 51 U/L Final   Performed at [facility], [address] [place]  [place], [place] [address]   aPTT 10/23/2021  30  24 - 36 seconds Final   Performed at [facility], [address] [place], [place] [address]   Prothrombin Time 10/23/2021 13.7  11.4 - 15.2 seconds Final   INR 10/23/2021 1.1  0.8 - 1.2 Final   Comment: (NOTE) INR goal varies based on device and disease states. Performed at [facility], [address] [place], [place] [address]    Heparin Unfractionated 10/23/2021 0.15 (L)  0.30 - 0.70 IU/mL Final   Comment: (NOTE) The clinical reportable range upper limit is being lowered to >1.10 to align with the FDA approved guidance for the current laboratory assay.  If heparin results are below expected values, and patient dosage has  been confirmed, suggest follow up testing of antithrombin III levels. Performed at [facility], [address] [place], [place] [address]    WBC 10/24/2021 7.0  4.0 - 10.5 K/uL Final   RBC 10/24/2021 4.62  3.87 - 5.11 MIL/uL Final   Hemoglobin 10/24/2021 15.3 (H)  12.0 - 15.0 g/dL Final   HCT 76/81/1572 46.0  36.0 - 46.0 % Final   MCV 10/24/2021 99.6  80.0 - 100.0 fL Final   MCH 10/24/2021 33.1  26.0 - 34.0 pg Final   MCHC 10/24/2021 33.3  30.0 - 36.0 g/dL Final   RDW 62/09/5595 13.2  11.5 - 15.5 % Final   Platelets 10/24/2021 413 (H)  150 - 400 K/uL Final   nRBC 10/24/2021 0.0  0.0 - 0.2 % Final   Performed at [facility], [address] [place], [place] [address]   Cholesterol 10/24/2021 176  0 - 200 mg/dL Final   Triglycerides 45/36/4680 115  <150 mg/dL Final   HDL 32/06/2481 39 (L)  >40 mg/dL Final   Total CHOL/HDL Ratio 10/24/2021 4.5  RATIO Final   VLDL 10/24/2021 23  0 - 40 mg/dL Final   LDL Cholesterol 10/24/2021 114 (H)  0 - 99 mg/dL Final   Comment:        Total Cholesterol/HDL:CHD Risk Coronary Heart Disease Risk Table                     Men   Women  1/2 Average Risk   3.4   3.3  Average Risk       5.0   4.4  2 X Average Risk   9.6   7.1  3 X Average  Risk  23.4   11.0        Use the calculated Patient Ratio above and the CHD Risk Table to determine the patient's CHD Risk.        ATP III CLASSIFICATION (LDL):  <100     mg/dL   Optimal  500-370  mg/dL   Near or Above                    Optimal  130-159  mg/dL   Borderline  488-891  mg/dL   High  >694     mg/dL   Very High Performed at [facility], [address] [place], [place] [address]    Weight 10/24/2021 1,920  oz Final   Height 10/24/2021 64  in Final   BP 10/24/2021 96/72  mmHg Final   Ao pk vel 10/24/2021 1.01  m/s Final   AV Area VTI 10/24/2021 3.11  cm2 Final   AR max vel 10/24/2021 3.05  cm2 Final   AV Mean grad 10/24/2021 2.0  mmHg Final  AV Peak grad 10/24/2021 4.1  mmHg Final   S' Lateral 10/24/2021 2.75  cm Final   AV Area mean vel 10/24/2021 2.84  cm2 Final   Area-P 1/2 10/24/2021 4.06  cm2 Final   MV VTI 10/24/2021 3.35  cm2 Final   Troponin I (High Sensitivity) 10/23/2021 8  <18 ng/L Final   Comment: (NOTE) Elevated high sensitivity troponin I (hsTnI) values and significant  changes across serial measurements may suggest ACS but many other  chronic and acute conditions are known to elevate hsTnI results.  Refer to the "Links" section for chest pain algorithms and additional  guidance. Performed at [facility], [address] [place], [place] [address]    Troponin I (High Sensitivity) 10/23/2021 127 (HH)  <18 ng/L Final   Comment: CRITICAL RESULT CALLED TO, READ BACK BY AND VERIFIED WITH ASHLEIGH WRIGHT @2103  ON 10/23/21 SKL (NOTE) Elevated high sensitivity troponin I (hsTnI) values and significant  changes across serial measurements may suggest ACS but many other  chronic and acute conditions are known to elevate hsTnI results.  Refer to the "Links" section for chest pain algorithms and additional  guidance. Performed at [facility], [address] [place], [place] [address]    Heparin Unfractionated 10/24/2021  0.20 (L)  0.30 - 0.70 IU/mL Final   Comment: (NOTE) The clinical reportable range upper limit is being lowered to >1.10 to align with the FDA approved guidance for the current laboratory assay.  If heparin results are below expected values, and patient dosage has  been confirmed, suggest follow up testing of antithrombin III levels. Performed at [facility], [address] [place], [place] [address]    Heparin Unfractionated 10/24/2021 <0.10 (L)  0.30 - 0.70 IU/mL Final   Comment: REPEATED TO VERIFY Performed at [facility], [address] [place], [place] [address]     Allergies: Bactrim, Flagyl [metronidazole hcl], Lithium, Onion, Other, Peanuts [nuts], Sulfamethoxazole-trimethoprim, Latex, and Tape  PTA Medications: (Not in a hospital admission)   Long Term Goals: Improvement in symptoms so as ready for discharge  Short Term Goals: Ability to identify changes in lifestyle to reduce recurrence of condition will improve, Ability to verbalize feelings will improve, Ability to disclose and discuss suicidal ideas, Ability to demonstrate self-control will improve, Ability to identify and develop effective coping behaviors will improve, Ability to maintain clinical measurements within normal limits will improve, Compliance with prescribed medications will improve, and Ability to identify triggers associated with substance abuse/mental health issues will improve  Medical Decision Making  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  She reports some stomach upset and lightheadedness today.  Reviewed safety protocols to prevent falls, to include sitting slowly prior to getting up and calling for assistance from nursing staff as needed.  Patient is able to contract for safety. Patient requests medication for sleep, depression and mood stabilization. She meets criteria for treatment in the [facility].    Recommendations  Based on my evaluation the patient does not appear  to have an emergency medical condition.  Disposition: Patient meets criteria for treatment in the [facility].  Social work notified patient is requesting referral to residential substance abuse treatment.  EKG completed 12/03/2021: QTc 469;  Normal sinus rhythm Right atrial enlargement Right bundle branch block Abnormal ECG When compared with ECG of 24-Oct-2021 23:41, No significant change since last tracing  Medications: -Continue [facility] protocol-clonidine taper -Increase trazodone to 100 mg at bedtime as needed for sleep -Start Celexa 10  mg daily for depression -Start Abilify 5 mg daily for mood stabilization and depression augmentation  Continue as needed medications: Medications   acetaminophen (TYLENOL) tablet 650 mg   alum & mag hydroxide-simeth (MAALOX/MYLANTA) 200-200-20 MG/5ML suspension 30 mL   magnesium hydroxide (MILK OF MAGNESIA) suspension 30 mL   dicyclomine (BENTYL) tablet 20 mg   hydrOXYzine (ATARAX) tablet 25 mg   loperamide (IMODIUM) capsule 2-4 mg   methocarbamol (ROBAXIN) tablet 500 mg   naproxen (NAPROSYN) tablet 500 mg   ondansetron (ZOFRAN-ODT) disintegrating tablet 4 mg    Lab Orders and reviewed (see results above):      Resp Panel by RT-PCR (Flu A&B, Covid) Nasopharyngeal Swab         CBC with Differential/Platelet         Comprehensive metabolic panel         Hemoglobin A1c         Magnesium         Ethanol         Lipid panel         TSH         RPR         Urinalysis, Complete w Microscopic Urine, Clean Catch         Pregnancy, urine         POCT Urine Drug Screen - (I-Screen)         POC SARS Coronavirus 2 Ag-ED - Nasal Swab       Mariel Craft, MD 12/03/21  3:39 PM

## 2021-12-03 NOTE — ED Notes (Signed)
Pt received her meal, took a bite and said that she cannot eat and asked if she can take the food to her room. Tech told her that she cannot take food or drinks to her room except for water, she decided to save it at the dinning area.

## 2021-12-03 NOTE — ED Notes (Signed)
Patient resting quietly in bed with eyes closed. No S/S of discomfort. Respirations even and unlabored. Will continue to monitor.

## 2021-12-03 NOTE — Clinical Social Work Psych Note (Addendum)
LCSW Initial Note  LCSW met with Davion for introduction and to begin discussions regarding treatment and potential discharge planning. Lurene presented with a dysphoric affect, depressed mood, however was pleasant and cooperative with this Probation officer.   Astou denied having any SI, HI or AVH. Myliyah does report experiencing some withdrawal symptoms, including slight tremors, sweating, stomach upset and light headedness.   Cielo shared that she did sleep well last night. She denied having ant other medical/physical complaints to this writer at this time.   Briar endorsed polysubstance use for "many years. 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 She also endorsed smoking cannabis, cocaine and drinking alcohol.   Sabrin shared that her main goal is to detox from substance and participate in a residential treatment program for substance abuse.       LCSW explained the referral process to [place] and she expressed understanding.   LCSW referred the patient to the following facilities for review:  [facility]- under review   [facility] - no bed availability  [facility] - no bed availability [facility] - no bed availability [facility] - no bed availability.   Niki was started on a Clonodine taper that is scheduled to end on Sunday, 12/08/21.    LCSW will continue to follow for possible placement.     Radonna Ricker, MSW, LCSW Clinical Education officer, museum ([place]) [facility]

## 2021-12-04 ENCOUNTER — Encounter (HOSPITAL_COMMUNITY): Payer: Self-pay

## 2021-12-04 ENCOUNTER — Encounter (HOSPITAL_COMMUNITY): Payer: Self-pay | Admitting: Psychiatry

## 2021-12-04 MED ORDER — TRAZODONE HCL 100 MG PO TABS: 100.0000 mg | ORAL_TABLET | Freq: Every evening | ORAL | 0 refills | Status: DC | PRN

## 2021-12-04 MED ORDER — NICOTINE 14 MG/24HR TD PT24: 14.0000 mg | MEDICATED_PATCH | Freq: Once | TRANSDERMAL | Status: DC

## 2021-12-04 MED ORDER — CITALOPRAM HYDROBROMIDE 10 MG PO TABS: 10.0000 mg | ORAL_TABLET | Freq: Every day | ORAL | 0 refills | Status: DC

## 2021-12-04 MED ORDER — ARIPIPRAZOLE 5 MG PO TABS: 5.0000 mg | ORAL_TABLET | Freq: Every day | ORAL | 0 refills | Status: DC

## 2021-12-04 NOTE — ED Notes (Signed)
Pt has received her [facility] paperwork. This nurse went over AVS paperwork with patient. Pt has been escorted to lockers to get her belongings and then walked out to waiting room to await her ride

## 2021-12-04 NOTE — ED Provider Notes (Signed)
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 PURPOSE, NOTIFY LAB WITHIN 5 DAYS.  LOWEST DETECTABLE LIMITS FOR URINE DRUG SCREEN Drug Class                     Cutoff (ng/mL) Amphetamine and metabolites    1000 Barbiturate and metabolites    200 Benzodiazepine                 [id number] Tricyclics and metabolites     300 Opiates and metabolites        300 Cocaine and metabolites        300 THC                            50 Performed at [facility], [place]  [address], [place], [place] [id number]    Salicylate Lvl [id number] <7.0 (L)  7.0 -  30.0 mg/dL Final   Performed at [facility], [place] [address], [place], [place] [address]   Acetaminophen (Tylenol), Serum 10/25/2021 <10 (L)  10 - 30 ug/mL Final   Comment: (NOTE) Therapeutic concentrations vary significantly. A range of 10-30 ug/mL  may be an effective concentration for many patients. However, some  are best treated at concentrations outside of this range. Acetaminophen concentrations >150 ug/mL at 4 hours after ingestion  and >50 ug/mL at 12 hours after ingestion are often associated with  toxic reactions.  Performed at [facility], [place] [address] [place], [place] [address]    I-stat hCG, quantitative 10/25/2021 <5.0  <5 mIU/mL Final   Comment 3 10/25/2021          Final   Comment:   GEST. AGE      CONC.  (mIU/mL)   <=1 WEEK        5 - 50     2 WEEKS       50 - 500     3 WEEKS       100 - 10,000     4 WEEKS     1,000 - 30,000        FEMALE AND NON-PREGNANT FEMALE:     LESS THAN 5 mIU/mL    Troponin I (High Sensitivity) 10/25/2021 34 (H)  <18 ng/L Final   Comment: (NOTE) Elevated high sensitivity troponin I (hsTnI) values and significant  changes across serial measurements may suggest ACS but many other  chronic and acute conditions are known to elevate hsTnI results.  Refer to the "Links" section for chest pain algorithms and additional  guidance. Performed at [facility], [place] [address], [place], [place] [address]   Admission on 10/24/2021, Discharged on 10/24/2021  Component Date Value Ref Range Status   WBC 10/24/2021 8.0  4.0 - 10.5 K/uL Final   RBC 10/24/2021 4.55  3.87 - 5.11 MIL/uL Final   Hemoglobin 10/24/2021 15.1 (H)  12.0 - 15.0 g/dL Final   HCT 10/24/2021 44.9  36.0 - 46.0 % Final   MCV 10/24/2021 98.7  80.0 - 100.0 fL Final   MCH 10/24/2021 33.2  26.0 - 34.0 pg Final   MCHC 10/24/2021 33.6  30.0 - 36.0 g/dL Final   RDW 10/24/2021 13.2  11.5 -  15.5 % Final   Platelets 10/24/2021 492 (H)  150 - 400 K/uL Final   nRBC 10/24/2021 0.0  0.0 - 0.2 % Final   Neutrophils Relative % 10/24/2021 48  % Final   Neutro Abs 10/24/2021 3.8  1.7 - 7.7 K/uL Final   Lymphocytes Relative 10/24/2021 45  % Final   Lymphs Abs 10/24/2021 3.6  0.7 - 4.0 K/uL Final   Monocytes Relative 10/24/2021 7  % Final   Monocytes Absolute 10/24/2021 0.5  0.1 - 1.0 K/uL Final   Eosinophils Relative 10/24/2021 0  % Final   Eosinophils Absolute 10/24/2021 0.0  0.0 - 0.5 K/uL Final   Basophils Relative 10/24/2021 0  % Final   Basophils Absolute 10/24/2021 0.0  0.0 - 0.1 K/uL Final   Immature Granulocytes 10/24/2021 0  % Final   Abs Immature Granulocytes 10/24/2021 0.02  0.00 - 0.07 K/uL Final   Performed at [facility], [place],  [address]   Sodium 10/24/2021 134 (L)  135 - 145 mmol/L Final   Potassium 10/24/2021 4.2  3.5 - 5.1 mmol/L Final   Chloride 10/24/2021 99  98 -  111 mmol/L Final   CO2 10/24/2021 19 (L)  22 - 32 mmol/L Final   Glucose, Bld 10/24/2021 87  70 - 99 mg/dL Final   Glucose reference range applies only to samples taken after fasting for at least 8 hours.   BUN 10/24/2021 16  6 - 20 mg/dL Final   Creatinine, Ser 10/24/2021 1.12 (H)  0.44 - 1.00 mg/dL Final   Calcium 78/29/5621 9.9  8.9 - 10.3 mg/dL Final   Total Protein 30/86/5784 8.7 (H)  6.5 - 8.1 g/dL Final   Albumin 69/62/9528 4.6  3.5 - 5.0 g/dL Final   AST 41/32/4401 26  15 - 41 U/L Final   ALT 10/24/2021 10  0 - 44 U/L Final   Alkaline Phosphatase 10/24/2021 61  38 - 126 U/L Final   Total Bilirubin 10/24/2021 0.8  0.3 - 1.2 mg/dL Final   GFR, Estimated 10/24/2021 >60  >60 mL/min Final   Comment: (NOTE) Calculated using the CKD-EPI Creatinine Equation (2021)    Anion gap 10/24/2021 16 (H)  5 - 15 Final   Performed at [facility], [address] [place], [place] [address]   Troponin I (High Sensitivity) 10/24/2021 40 (H)  <18 ng/L Final    Comment: (NOTE) Elevated high sensitivity troponin I (hsTnI) values and significant  changes across serial measurements may suggest ACS but many other  chronic and acute conditions are known to elevate hsTnI results.  Refer to the "Links" section for chest pain algorithms and additional  guidance. Performed at [facility], [address] [place], [place] [address]    D-Dimer, Quant 10/24/2021 0.37  0.00 - 0.50 ug/mL-FEU Final   Comment: (NOTE) At the manufacturer cut-off value of 0.5 g/mL FEU, this assay has a negative predictive value of 95-100%.This assay is intended for use in conjunction with a clinical pretest probability (PTP) assessment model to exclude pulmonary embolism (PE) and deep venous thrombosis (DVT) in outpatients suspected of PE or DVT. Results should be correlated with clinical presentation. Performed at [facility], [address] [place], [place] [address]    SARS Coronavirus 2 by RT PCR 10/24/2021 NEGATIVE  NEGATIVE Final   Comment: (NOTE) SARS-CoV-2 target nucleic acids are NOT DETECTED.  The SARS-CoV-2 RNA is generally detectable in upper respiratory specimens during the acute phase of infection. The lowest concentration of SARS-CoV-2 viral copies this assay can detect is 138 copies/mL. A negative result does not preclude SARS-Cov-2 infection and should not be used as the sole basis for treatment or other patient management decisions. A negative result may occur with  improper specimen collection/handling, submission of specimen other than nasopharyngeal swab, presence of viral mutation(s) within the areas targeted by this assay, and inadequate number of viral copies(<138 copies/mL). A negative result must be combined with clinical observations, patient history, and epidemiological information. The expected result is Negative.  Fact Sheet for Patients:  BloggerCourse.com  Fact Sheet for Healthcare  Providers:  SeriousBroker.it  This test is no                          t yet approved or cleared by the [place] FDA and  has been authorized for detection and/or diagnosis of SARS-CoV-2 by FDA under an Emergency Use Authorization (EUA). This EUA will remain  in effect (meaning this test can be used) for the duration of the COVID-19 declaration under Section 564(b)(1) of the Act, 21 U.S.C.section 360bbb-3(b)(1), unless the authorization is  terminated  or revoked sooner.       Influenza A by PCR 10/24/2021 NEGATIVE  NEGATIVE Final   Influenza B by PCR 10/24/2021 NEGATIVE  NEGATIVE Final   Comment: (NOTE) The Xpert Xpress SARS-CoV-2/FLU/RSV plus assay is intended as an aid in the diagnosis of influenza from Nasopharyngeal swab specimens and should not be used as a sole basis for treatment. Nasal washings and aspirates are unacceptable for Xpert Xpress SARS-CoV-2/FLU/RSV testing.  Fact Sheet for Patients: EntrepreneurPulse.com.au  Fact Sheet for Healthcare Providers: IncredibleEmployment.be  This test is not yet approved or cleared by the [place] FDA and has been authorized for detection and/or diagnosis of SARS-CoV-2 by FDA under an Emergency Use Authorization (EUA). This EUA will remain in effect (meaning this test can be used) for the duration of the COVID-19 declaration under Section 564(b)(1) of the Act, 21 U.S.C. section 360bbb-3(b)(1), unless the authorization is terminated or revoked.  Performed at [facility], [address] [place], [place] [address]    Procalcitonin 10/24/2021 <0.10  ng/mL Final   Comment:        Interpretation: PCT (Procalcitonin) <= 0.5 ng/mL: Systemic infection (sepsis) is not likely. Local bacterial infection is possible. (NOTE)       Sepsis PCT Algorithm           Lower Respiratory Tract                                      Infection PCT Algorithm     ----------------------------     ----------------------------         PCT < 0.25 ng/mL                PCT < 0.10 ng/mL          Strongly encourage             Strongly discourage   discontinuation of antibiotics    initiation of antibiotics    ----------------------------     -----------------------------       PCT 0.25 - 0.50 ng/mL            PCT 0.10 - 0.25 ng/mL               OR       >80% decrease in PCT            Discourage initiation of                                            antibiotics      Encourage discontinuation           of antibiotics    ----------------------------     -----------------------------         PCT >= 0.50 ng/mL              PCT 0.26 - 0.50 ng/mL               AND                                 <80% decrease in PCT             Encourage initiation of  antibiotics       Encourage continuation           of antibiotics    ----------------------------     -----------------------------        PCT >= 0.50 ng/mL                  PCT > 0.50 ng/mL               AND         increase in PCT                  Strongly encourage                                      initiation of antibiotics    Strongly encourage escalation           of antibiotics                                     -----------------------------                                           PCT <= 0.25 ng/mL                                                 OR                                        > 80% decrease in PCT                                      Discontinue / Do not initiate                                             antibiotics  Performed at [facility], [address] [place], [place] [address]    hCG, Ollen Barges, America Brown, S 10/24/2021 <1  <5 mIU/mL Final   Comment:          GEST. AGE      CONC.  (mIU/mL)   <=1 WEEK        5 - 50     2 WEEKS       50 - 500     3 WEEKS       100 - 10,000     4 WEEKS     1,000 - 30,000     5  WEEKS     3,500 - 115,000   6-8 WEEKS     12,000 - 270,000    12 WEEKS     15,000 - 220,000        FEMALE AND NON-PREGNANT FEMALE:     LESS THAN 5 mIU/mL Performed at [facility], [address] [place],  [place] [address]    Magnesium 10/24/2021 2.3  1.7 - 2.4 mg/dL Final   Performed at [facility], [place], [place] [address]   Troponin I (High Sensitivity) 10/24/2021 43 (H)  <18 ng/L Final   Comment: (NOTE) Elevated high sensitivity troponin I (hsTnI) values and significant  changes across serial measurements may suggest ACS but many other  chronic and acute conditions are known to elevate hsTnI results.  Refer to the "Links" section for chest pain algorithms and additional  guidance. Performed at [facility], [address] [place], [place] [address]   Admission on 10/23/2021, Discharged on 10/24/2021  Component Date Value Ref Range Status   SARS Coronavirus 2 by RT PCR 10/23/2021 NEGATIVE  NEGATIVE Final   Comment: (NOTE) SARS-CoV-2 target nucleic acids are NOT DETECTED.  The SARS-CoV-2 RNA is generally detectable in upper respiratory specimens during the acute phase of infection. The lowest concentration of SARS-CoV-2 viral copies this assay can detect is 138 copies/mL. A negative result does not preclude SARS-Cov-2 infection and should not be used as the sole basis for treatment or other patient management decisions. A negative result may occur with  improper specimen collection/handling, submission of specimen other than nasopharyngeal swab, presence of viral mutation(s) within the areas targeted by this assay, and inadequate number of viral copies(<138 copies/mL). A negative result must be combined with clinical observations, patient history, and epidemiological information. The expected result is Negative.  Fact Sheet for Patients:  EntrepreneurPulse.com.au  Fact Sheet for Healthcare Providers:   IncredibleEmployment.be  This test is no                          t yet approved or cleared by the [place] FDA and  has been authorized for detection and/or diagnosis of SARS-CoV-2 by FDA under an Emergency Use Authorization (EUA). This EUA will remain  in effect (meaning this test can be used) for the duration of the COVID-19 declaration under Section 564(b)(1) of the Act, 21 U.S.C.section 360bbb-3(b)(1), unless the authorization is terminated  or revoked sooner.       Influenza A by PCR 10/23/2021 NEGATIVE  NEGATIVE Final   Influenza B by PCR 10/23/2021 NEGATIVE  NEGATIVE Final   Comment: (NOTE) The Xpert Xpress SARS-CoV-2/FLU/RSV plus assay is intended as an aid in the diagnosis of influenza from Nasopharyngeal swab specimens and should not be used as a sole basis for treatment. Nasal washings and aspirates are unacceptable for Xpert Xpress SARS-CoV-2/FLU/RSV testing.  Fact Sheet for Patients: EntrepreneurPulse.com.au  Fact Sheet for Healthcare Providers: IncredibleEmployment.be  This test is not yet approved or cleared by the [place] FDA and has been authorized for detection and/or diagnosis of SARS-CoV-2 by FDA under an Emergency Use Authorization (EUA). This EUA will remain in effect (meaning this test can be used) for the duration of the COVID-19 declaration under Section 564(b)(1) of the Act, 21 U.S.C. section 360bbb-3(b)(1), unless the authorization is terminated or revoked.  Performed at [facility], [place],  [address]    Sodium 10/23/2021 133 (L)  135 - 145 mmol/L Final   Potassium 10/23/2021 3.4 (L)  3.5 - 5.1 mmol/L Final   Chloride 10/23/2021 102  98 - 111 mmol/L Final   CO2 10/23/2021 23  22 - 32 mmol/L Final   Glucose, Bld 10/23/2021 109 (H)  70 - 99 mg/dL Final   Glucose reference range applies only to samples taken after  fasting for at least 8 hours.    BUN 10/23/2021 8  6 - 20 mg/dL Final   Creatinine, Ser 10/23/2021 0.71  0.44 - 1.00 mg/dL Final   Calcium 10/23/2021 9.2  8.9 - 10.3 mg/dL Final   Total Protein 10/23/2021 8.3 (H)  6.5 - 8.1 g/dL Final   Albumin 10/23/2021 4.3  3.5 - 5.0 g/dL Final   AST 10/23/2021 15  15 - 41 U/L Final   ALT 10/23/2021 10  0 - 44 U/L Final   Alkaline Phosphatase 10/23/2021 57  38 - 126 U/L Final   Total Bilirubin 10/23/2021 0.4  0.3 - 1.2 mg/dL Final   GFR, Estimated 10/23/2021 >60  >60 mL/min Final   Comment: (NOTE) Calculated using the CKD-EPI Creatinine Equation (2021)    Anion gap 10/23/2021 8  5 - 15 Final   Performed at [facility], [address] [place], [place] [address]   Alcohol, Ethyl (B) 10/23/2021 <10  <10 mg/dL Final   Comment: (NOTE) Lowest detectable limit for serum alcohol is 10 mg/dL.  For medical purposes only. Performed at [facility], [address] [place], [place] [id number]    Tricyclic, Ur Screen [id number] NONE DETECTED  NONE DETECTED Final   Amphetamines, Ur Screen 10/23/2021 POSITIVE (A)  NONE DETECTED Final   MDMA (Ecstasy)Ur Screen 10/23/2021 NONE DETECTED  NONE DETECTED Final   Cocaine Metabolite,Ur [place] 10/23/2021 POSITIVE (A)  NONE DETECTED Final   Opiate, Ur Screen 10/23/2021 POSITIVE (A)  NONE DETECTED Final   Phencyclidine (PCP) Ur S 10/23/2021 NONE DETECTED  NONE DETECTED Final   Cannabinoid 50 Ng, Ur  10/23/2021 POSITIVE (A)  NONE DETECTED Final   Barbiturates, Ur Screen 10/23/2021 NONE DETECTED  NONE DETECTED Final   Benzodiazepine, Ur Scrn 10/23/2021 POSITIVE (A)  NONE DETECTED Final   Methadone Scn, Ur 10/23/2021 NONE DETECTED  NONE DETECTED Final   Comment: (NOTE) Tricyclics + metabolites, urine    Cutoff 1000 ng/mL Amphetamines + metabolites, urine  Cutoff 1000 ng/mL MDMA (Ecstasy), urine              Cutoff 500 ng/mL Cocaine Metabolite, urine          Cutoff 300 ng/mL Opiate + metabolites, urine        Cutoff 300  ng/mL Phencyclidine (PCP), urine         Cutoff 25 ng/mL Cannabinoid, urine                 Cutoff 50 ng/mL Barbiturates + metabolites, urine  Cutoff 200 ng/mL Benzodiazepine, urine              Cutoff 200 ng/mL Methadone, urine                   Cutoff 300 ng/mL  The urine drug screen provides only a preliminary, unconfirmed analytical test result and should not be used for non-medical purposes. Clinical consideration and professional judgment should be applied to any positive drug screen result due to possible interfering substances. A more specific alternate chemical method must be used in order to obtain a confirmed analytical result. Gas chromatography / mass spectrometry (GC/MS) is the preferred confirm                          atory method. Performed at [facility], [place], [place] [address]    WBC 10/23/2021 8.7  4.0 - 10.5 K/uL Final   RBC 10/23/2021  4.11  3.87 - 5.11 MIL/uL Final   Hemoglobin 10/23/2021 13.6  12.0 - 15.0 g/dL Final   HCT 10/23/2021 40.5  36.0 - 46.0 % Final   MCV 10/23/2021 98.5  80.0 - 100.0 fL Final   MCH 10/23/2021 33.1  26.0 - 34.0 pg Final   MCHC 10/23/2021 33.6  30.0 - 36.0 g/dL Final   RDW 10/23/2021 13.3  11.5 - 15.5 % Final   Platelets 10/23/2021 421 (H)  150 - 400 K/uL Final   nRBC 10/23/2021 0.0  0.0 - 0.2 % Final   Neutrophils Relative % 10/23/2021 70  % Final   Neutro Abs 10/23/2021 6.0  1.7 - 7.7 K/uL Final   Lymphocytes Relative 10/23/2021 25  % Final   Lymphs Abs 10/23/2021 2.2  0.7 - 4.0 K/uL Final   Monocytes Relative 10/23/2021 5  % Final   Monocytes Absolute 10/23/2021 0.5  0.1 - 1.0 K/uL Final   Eosinophils Relative 10/23/2021 0  % Final   Eosinophils Absolute 10/23/2021 0.0  0.0 - 0.5 K/uL Final   Basophils Relative 10/23/2021 0  % Final   Basophils Absolute 10/23/2021 0.0  0.0 - 0.1 K/uL Final   Immature Granulocytes 10/23/2021 0  % Final   Abs Immature Granulocytes 10/23/2021 0.02  0.00 - 0.07 K/uL  Final   Performed at [facility], [address] [place], [place] [address]   Preg Test, Ur 10/23/2021 Negative  Negative Final   Acetaminophen (Tylenol), Serum 10/23/2021 <10 (L)  10 - 30 ug/mL Final   Comment: (NOTE) Therapeutic concentrations vary significantly. A range of 10-30 ug/mL  may be an effective concentration for many patients. However, some  are best treated at concentrations outside of this range. Acetaminophen concentrations >150 ug/mL at 4 hours after ingestion  and >50 ug/mL at 12 hours after ingestion are often associated with  toxic reactions.  Performed at [facility], [place], [place] [address]    Troponin I (High Sensitivity) 10/23/2021 176 (HH)  <18 ng/L Final   Comment: CRITICAL RESULT CALLED TO, READ BACK BY AND VERIFIED WITH REINA GALIJOUR 10/23/21 1442 KLW (NOTE) Elevated high sensitivity troponin I (hsTnI) values and significant  changes across serial measurements may suggest ACS but many other  chronic and acute conditions are known to elevate hsTnI results.  Refer to the "Links" section for chest pain algorithms and additional  guidance. Performed at [facility], [address] [place], [place] [address]    Total CK 10/23/2021 68  38 - 234 U/L Final   Performed at [facility] [address] [place], [place] [address]   Magnesium 10/23/2021 2.1  1.7 - 2.4 mg/dL Final   Performed at [facility], [place], [place] [id number]   Salicylate Lvl 99991111 <7.0 (L)  7.0 - 30.0 mg/dL Final   Performed at [facility], [place], [place] [address]   Troponin I (High Sensitivity) 10/23/2021 181 (HH)  <18 ng/L Final   Comment: CRITICAL VALUE NOTED. VALUE IS CONSISTENT WITH PREVIOUSLY REPORTED/CALLED VALUE KLW (NOTE) Elevated high sensitivity troponin I (hsTnI) values and significant  changes across serial measurements may suggest ACS but many other   chronic and acute conditions are known to elevate hsTnI results.  Refer to the "Links" section for chest pain algorithms and additional  guidance. Performed at [facility], [address] [place], [place] [address]    Lipase 10/23/2021 31  11 - 51  U/L Final   Performed at [facility], [address] [place], [place] [address]   aPTT 10/23/2021 30  24 - 36 seconds Final   Performed at [facility], [address] [place], [place] [address]   Prothrombin Time 10/23/2021 13.7  11.4 - 15.2 seconds Final   INR 10/23/2021 1.1  0.8 - 1.2 Final   Comment: (NOTE) INR goal varies based on device and disease states. Performed at [facility], [address] [place], [place] [address]    Heparin Unfractionated 10/23/2021 0.15 (L)  0.30 - 0.70 IU/mL Final   Comment: (NOTE) The clinical reportable range upper limit is being lowered to >1.10 to align with the FDA approved guidance for the current laboratory assay.  If heparin results are below expected values, and patient dosage has  been confirmed, suggest follow up testing of antithrombin III levels. Performed at [facility], [address] [place], [place] [address]    WBC 10/24/2021 7.0  4.0 - 10.5 K/uL Final   RBC 10/24/2021 4.62  3.87 - 5.11 MIL/uL Final   Hemoglobin 10/24/2021 15.3 (H)  12.0 - 15.0 g/dL Final   HCT 27/12/2374 46.0  36.0 - 46.0 % Final   MCV 10/24/2021 99.6  80.0 - 100.0 fL Final   MCH 10/24/2021 33.1  26.0 - 34.0 pg Final   MCHC 10/24/2021 33.3  30.0 - 36.0 g/dL Final   RDW 28/31/5176 13.2  11.5 - 15.5 % Final   Platelets 10/24/2021 413 (H)  150 - 400 K/uL Final   nRBC 10/24/2021 0.0  0.0 - 0.2 % Final   Performed at [facility], [address] [place], [place] [address]   Cholesterol 10/24/2021 176  0 - 200 mg/dL Final   Triglycerides 71/12/2692 115  <150 mg/dL Final   HDL 85/46/2703 39 (L)  >40 mg/dL Final   Total CHOL/HDL Ratio 10/24/2021 4.5   RATIO Final   VLDL 10/24/2021 23  0 - 40 mg/dL Final   LDL Cholesterol 10/24/2021 114 (H)  0 - 99 mg/dL Final   Comment:        Total Cholesterol/HDL:CHD Risk Coronary Heart Disease Risk Table                     Men   Women  1/2 Average Risk   3.4   3.3  Average Risk       5.0   4.4  2 X Average Risk   9.6   7.1  3 X Average Risk  23.4   11.0        Use the calculated Patient Ratio above and the CHD Risk Table to determine the patient's CHD Risk.        ATP III CLASSIFICATION (LDL):  <100     mg/dL   Optimal  500-938  mg/dL   Near or Above                    Optimal  130-159  mg/dL   Borderline  182-993  mg/dL   High  >716     mg/dL   Very High Performed at [facility], [address] [place], [place] [address]    Weight 10/24/2021 1,920  oz Final   Height 10/24/2021 64  in Final   BP 10/24/2021 96/72  mmHg Final   Ao pk vel 10/24/2021 1.01  m/s Final   AV Area VTI 10/24/2021 3.11  cm2 Final  AR max vel 10/24/2021 3.05  cm2 Final   AV Mean grad 10/24/2021 2.0  mmHg Final   AV Peak grad 10/24/2021 4.1  mmHg Final   S' Lateral 10/24/2021 2.75  cm Final   AV Area mean vel 10/24/2021 2.84  cm2 Final   Area-P 1/2 10/24/2021 4.06  cm2 Final   MV VTI 10/24/2021 3.35  cm2 Final   Troponin I (High Sensitivity) 10/23/2021 8  <18 ng/L Final   Comment: (NOTE) Elevated high sensitivity troponin I (hsTnI) values and significant  changes across serial measurements may suggest ACS but many other  chronic and acute conditions are known to elevate hsTnI results.  Refer to the "Links" section for chest pain algorithms and additional  guidance. Performed at [facility], [place], [place] [id number]    Troponin I (High Sensitivity) 10/23/2021 127 (HH)  <18 ng/L Final   Comment: CRITICAL RESULT CALLED TO, READ BACK BY AND VERIFIED WITH ASHLEIGH WRIGHT @2103  ON 10/23/21 SKL (NOTE) Elevated high sensitivity troponin I (hsTnI) values and significant   changes across serial measurements may suggest ACS but many other  chronic and acute conditions are known to elevate hsTnI results.  Refer to the "Links" section for chest pain algorithms and additional  guidance. Performed at [facility], [place], [place] [address]    Heparin Unfractionated 10/24/2021 0.20 (L)  0.30 - 0.70 IU/mL Final   Comment: (NOTE) The clinical reportable range upper limit is being lowered to >1.10 to align with the FDA approved guidance for the current laboratory assay.  If heparin results are below expected values, and patient dosage has  been confirmed, suggest follow up testing of antithrombin III levels. Performed at [facility], [address] [place], [place] [address]    Heparin Unfractionated 10/24/2021 <0.10 (L)  0.30 - 0.70 IU/mL Final   Comment: REPEATED TO VERIFY Performed at [facility], [address] [place],  [address]     Allergies: Bactrim, Flagyl [metronidazole hcl], Lithium, Onion, Other, Peanuts [nuts], Sulfamethoxazole-trimethoprim, Latex, and Tape  PTA Medications: (Not in a hospital admission)   Long Term Goals: Improvement in symptoms so as ready for discharge  Short Term Goals: Ability to identify changes in lifestyle to reduce recurrence of condition will improve, Ability to verbalize feelings will improve, Ability to disclose and discuss suicidal ideas, Ability to demonstrate self-control will improve, Ability to identify and develop effective coping behaviors will improve, Ability to maintain clinical measurements within normal limits will improve, Compliance with prescribed medications will improve, and Ability to identify triggers associated with substance abuse/mental health issues will improve  Medical Decision Making  Patient had difficulty tolerating detox protocol with clonidine.  This will be discontinued today. Patient is able to contract for safety. Patient  requests medication for sleep, depression and mood stabilization. She meets criteria for treatment in the [facility].    Recommendations  Based on my evaluation the patient does not appear to have an emergency medical condition.  Disposition: Patient meets criteria for treatment in the [facility].  Patient has been accepted to [facility] residential substance abuse treatment for 12/10/2021  EKG completed 12/03/2021: QTc 469;  Normal sinus rhythm Right atrial enlargement Right bundle branch block Abnormal ECG When compared with ECG of 24-Oct-2021 23:41, No significant change since last tracing  Medications: - Discontinue clonidine taper - Continue trazodone 100 mg at bedtime as needed for sleep - Continue Celexa 10 mg daily for depression - Continue Abilify  5 mg daily for mood stabilization and depression augmentation  Continue as needed medications: Medications   acetaminophen (TYLENOL) tablet 650 mg   alum & mag hydroxide-simeth (MAALOX/MYLANTA) 200-200-20 MG/5ML suspension 30 mL   magnesium hydroxide (MILK OF MAGNESIA) suspension 30 mL   dicyclomine (BENTYL) tablet 20 mg   hydrOXYzine (ATARAX) tablet 25 mg   loperamide (IMODIUM) capsule 2-4 mg   methocarbamol (ROBAXIN) tablet 500 mg   naproxen (NAPROSYN) tablet 500 mg   ondansetron (ZOFRAN-ODT) disintegrating tablet 4 mg    Lab Orders and reviewed (see results above):      Resp Panel by RT-PCR (Flu A&B, Covid) Nasopharyngeal Swab         CBC with Differential/Platelet         Comprehensive metabolic panel         Hemoglobin A1c         Magnesium         Ethanol         Lipid panel         TSH         RPR         Urinalysis, Complete w Microscopic Urine, Clean Catch         Pregnancy, urine         POCT Urine Drug Screen - (I-Screen)         POC SARS Coronavirus 2 Ag-ED - Nasal Swab       Lavella Hammock, MD 12/04/21  4:26 PM
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
## 2021-12-04 NOTE — ED Notes (Signed)
Pt approached nurses station asking to speak with a provider. Pt states, "I changed my mind. I want to sign myself out today please". Staff left provider a message via secure chat of pt's request.

## 2021-12-04 NOTE — Discharge Instructions (Addendum)

## 2021-12-04 NOTE — ED Notes (Signed)
Patient denies SI, HI, and AVH. Patient compliant with medication administration. Patient states she feels anxious and just wants to rest. Patient resting comfortably. No acute distress noted. Will continue to monitor for safety.

## 2021-12-04 NOTE — ED Notes (Signed)
Pt in room resting quietly in bed.  Breakfast provided.  Pt denies SI, HI, and AVH.  Endorses body pain 7/10, prn medication provided.  Pt reports mood as depressed this morning because she was unable to speak with her father yesterday on the phone.  Reports issues with family dynamics due to homelessness and drug habits.   Pt would like to continue her detox process and transition into a 30 day rehab facility. Pt shared concerns about her medications causing her to become dizzy and weak.  Encouraged pt to take in fluids, rise slowly, and use side rails as needed.  Breathing is even ans unlabored.  Will continue to monitor for safety.

## 2021-12-04 NOTE — BH IP Treatment Plan (Signed)
Interdisciplinary Treatment and Diagnostic Plan Update  12/04/2021 Time of Session: 10:20AM Katherine STREHLE MRN: [id number]  Diagnosis:  Final diagnoses:  Polysubstance abuse (HCC)     Current Medications:  Current Facility-Administered Medications  Medication Dose Route Frequency Provider Last Rate Last Admin   acetaminophen (TYLENOL) tablet 650 mg  650 mg Oral Q6H PRN Ardis Hughs, NP   650 mg at 12/03/21 1727   alum & mag hydroxide-simeth (MAALOX/MYLANTA) 200-200-20 MG/5ML suspension 30 mL  30 mL Oral Q4H PRN Ardis Hughs, NP       ARIPiprazole (ABILIFY) tablet 5 mg  5 mg Oral Daily Mariel Craft, MD   5 mg at 12/04/21 8811   citalopram (CELEXA) tablet 10 mg  10 mg Oral Daily Mariel Craft, MD   10 mg at 12/04/21 0315   cloNIDine (CATAPRES) tablet 0.1 mg  0.1 mg Oral Verdell Face, NP       Followed by   Melene Muller ON 12/06/2021] cloNIDine (CATAPRES) tablet 0.1 mg  0.1 mg Oral QAC breakfast Ardis Hughs, NP       dicyclomine (BENTYL) tablet 20 mg  20 mg Oral Q6H PRN Ardis Hughs, NP       hydrOXYzine (ATARAX) tablet 25 mg  25 mg Oral Q6H PRN Ardis Hughs, NP   25 mg at 12/04/21 1552   loperamide (IMODIUM) capsule 2-4 mg  2-4 mg Oral PRN Ardis Hughs, NP       magnesium hydroxide (MILK OF MAGNESIA) suspension 30 mL  30 mL Oral Daily PRN Ardis Hughs, NP       methocarbamol (ROBAXIN) tablet 500 mg  500 mg Oral Q8H PRN Ardis Hughs, NP   500 mg at 12/04/21 0858   naproxen (NAPROSYN) tablet 500 mg  500 mg Oral BID PRN Ardis Hughs, NP   500 mg at 12/04/21 0859   ondansetron (ZOFRAN-ODT) disintegrating tablet 4 mg  4 mg Oral Q6H PRN Ardis Hughs, NP       traZODone (DESYREL) tablet 100 mg  100 mg Oral QHS PRN Mariel Craft, MD   100 mg at 12/03/21 2115   Current Outpatient Medications  Medication Sig Dispense Refill   albuterol (VENTOLIN HFA) 108 (90 Base) MCG/ACT inhaler Inhale 2 puffs into the lungs every 6  (six) hours as needed for wheezing or shortness of breath. (Patient not taking: Reported on 10/24/2021)     PTA Medications: Prior to Admission medications   Medication Sig Start Date End Date Taking? Authorizing Provider  albuterol (VENTOLIN HFA) 108 (90 Base) MCG/ACT inhaler Inhale 2 puffs into the lungs every 6 (six) hours as needed for wheezing or shortness of breath. Patient not taking: Reported on 10/24/2021    [provider]    Patient Stressors: Financial difficulties   Substance abuse    Patient Strengths: Motivation for treatment/growth   Treatment Modalities: Medication Management, Group therapy, Case management,  1 to 1 session with clinician, Psychoeducation, Recreational therapy.   Physician Treatment Plan for Primary and Secondary Diagnosis:  Final diagnoses:  Polysubstance abuse (HCC)   Long Term Goal(s): Improvement in symptoms so as ready for discharge  Short Term Goals: Ability to identify changes in lifestyle to reduce recurrence of condition will improve Ability to verbalize feelings will improve Ability to disclose and discuss suicidal ideas Ability to demonstrate self-control will improve Ability to identify and develop effective coping behaviors will improve Ability to maintain clinical measurements within normal  limits will improve Compliance with prescribed medications will improve Ability to identify triggers associated with substance abuse/mental health issues will improve  Medication Management: Evaluate patient's response, side effects, and tolerance of medication regimen.  Therapeutic Interventions: 1 to 1 sessions, Unit Group sessions and Medication administration.  Evaluation of Outcomes: Progressing  RN Treatment Plan for Primary Diagnosis:  Final diagnoses:  Polysubstance abuse (HCC)    Long Term Goal(s): Knowledge of disease and therapeutic regimen to maintain health will improve  Short Term Goals: Ability to identify and  develop effective coping behaviors will improve  Medication Management: RN will administer medications as ordered by provider, will assess and evaluate patient's response and provide education to patient for prescribed medication. RN will report any adverse and/or side effects to prescribing provider.  Therapeutic Interventions: 1 on 1 counseling sessions, Psychoeducation, Medication administration, Evaluate responses to treatment, Monitor vital signs and CBGs as ordered, Perform/monitor CIWA, COWS, AIMS and Fall Risk screenings as ordered, Perform wound care treatments as ordered.  Evaluation of Outcomes: Progressing   LCSW Treatment Plan for Primary Diagnosis:  Final diagnoses:  Polysubstance abuse (HCC)    Long Term Goal(s): Safe transition to appropriate next level of care at discharge, Engage patient in therapeutic group addressing interpersonal concerns.  Short Term Goals: Engage patient in aftercare planning with referrals and resources  Therapeutic Interventions: Assess for all discharge needs, 1 to 1 time with Social worker, Explore available resources and support systems, Assess for adequacy in community support network, Educate family and significant other(s) on suicide prevention, Complete Psychosocial Assessment, Interpersonal group therapy.  Evaluation of Outcomes: Adequate for Discharge   Progress in Treatment: Attending groups: Yes. Participating in groups: Yes. Taking medication as prescribed: Yes. Toleration medication: Yes. Family/Significant other contact made: No, will contact:  no one Patient understands diagnosis: Yes. Discussing patient identified problems/goals with staff: Yes. Medical problems stabilized or resolved: Yes. Denies suicidal/homicidal ideation: No. Issues/concerns per patient self-inventory: No. Other: None   New problem(s) identified: None  New Short Term/Long Term Goal(s): Amalia plans to engage in either long-term residential substance  abuse treatment services or intensive outpatient substance abuse treatment services following her completion of a 30-day residential program.   Patient Goals:  "I want to get clean. Im tired of living like this. My family don't want nothing to do with, I have nobody" .  Discharge Plan or Barriers: [name] accepted to [facility] for a screening appointment on Tuesday, 12/10/21 at 9:00am at [facility] for admission.   Reason for Continuation of Hospitalization: Depression Medication stabilization  Estimated Length of Stay: 3-5 days  Last 3 [place] Suicide Severity Risk Score: Flowsheet Row ED from 12/02/2021 in [facility] Most recent reading at 12/02/2021  6:19 PM ED from 10/24/2021 in [facility] EMERGENCY DEPARTMENT Most recent reading at 10/24/2021 11:44 PM ED from 10/24/2021 in [facility] EM[facility]ENC[facility] DEPARTMENT Most recent reading at 10/24/2021  2:50 PM  C-SSRS RISK CATEGORY No Risk Moderate Risk No Risk       Last PHQ 2/9 Scores:     View : No data to display.          Scribe for Treatment Team: Maeola Sarah, [place] 12/04/2021 4:19 PM

## 2021-12-04 NOTE — Group Note (Signed)
Group Topic: Understanding Self  Group Date: 12/04/2021 Start Time: 1220 End Time: 1245 Facilitators: Levander Campion  Department: [facility]  Number of Participants: 5  Group Focus: activities of daily living skills, coping skills, and problem solving Treatment Modality:  Behavior Modification Therapy and Solution-Focused Therapy Interventions utilized were patient education Purpose: enhance coping skills and regain self-worth  Name: Katherine Burgess Date of Birth: 05/22/1981  MR: [id number]    Level of Participation: moderate Quality of Participation: cooperative and disruptive Interactions with others: gave feedback Mood/Affect: appropriate Triggers (if applicable): n/a Cognition: coherent/clear Progress: Moderate Response: n/a Plan: follow-up needed  Patients Problems:  Patient Active Problem List   Diagnosis Date Noted   Bipolar I disorder, most recent episode depressed (HCC) 12/03/2021   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

## 2021-12-04 NOTE — ED Notes (Signed)
Pt is currently sleeping, no distress noted, environmental check complete, will continue to monitor patient for safety. ? ?

## 2021-12-04 NOTE — Clinical Social Work Psych Note (Addendum)
LCSW Update  Katherine Burgess shared that she continues to feel light-headed when she wakes up and get out of bed. Katherine Burgess believes her symptoms are a side effect to her Clonopin medication. Katherine Burgess and Dr. Leverne Humbles discussed stopping her Clonopin and leaving it as a PRN (if needed) medication.   LCSW informed Katherine Burgess that she was accepted to [facility] for a screening appointment on Tuesday, 12/10/21 at 9:00am at [facility] for admission.   Katherine Burgess expressed understanding and is agreeable with her discharge plans.   Katherine Burgess shared that she wants to ensure she "stays away" from drugs after her completion of [facility] 30-day residential program. LCSW ensured Katherine Burgess that she will be provided additional resources.   LCSW will authorize a taxi voucher to ensure transportation to facility on scheduled date.   Katherine Burgess denied having any additional questions or concerns at this time.    Katherine Burgess, MSW, LCSW Clinical Education officer, museum ([place]) [facility]

## 2021-12-04 NOTE — ED Notes (Signed)
Notified pt that breakfast is ready 

## 2021-12-04 NOTE — ED Notes (Signed)
Pt spoke with provider, changed her mind about discharging. Pt stated, "I really just wanted to go outside and smoke a cigarrette". Provider ordered a nicotine patch for pt. Pt verbalized appreciation. Safety maintained.

## 2021-12-04 NOTE — ED Notes (Signed)
Pt approached nurses station requesting to discharge. Pt states, "My mother is coming to pick me up". Writer notified Winfred Burn., NP, of pt's request. Provider stated she will come to unit.

## 2022-03-26 ENCOUNTER — Other Ambulatory Visit: Payer: Self-pay

## 2022-03-26 ENCOUNTER — Encounter (HOSPITAL_COMMUNITY): Payer: Self-pay | Admitting: *Deleted

## 2022-03-26 ENCOUNTER — Emergency Department (HOSPITAL_COMMUNITY)
Admission: EM | Admit: 2022-03-26 | Discharge: 2022-03-27 | Disposition: A | Discharge status: Home / Self Care | Payer: Medicaid Other | Attending: Emergency Medicine | Admitting: Emergency Medicine

## 2022-03-26 DIAGNOSIS — F1910 Other psychoactive substance abuse, uncomplicated: Secondary | ICD-10-CM | POA: Diagnosis not present

## 2022-03-26 DIAGNOSIS — F410 Panic disorder [episodic paroxysmal anxiety] without agoraphobia: Secondary | ICD-10-CM | POA: Diagnosis not present

## 2022-03-26 DIAGNOSIS — Z91010 Allergy to peanuts: Secondary | ICD-10-CM | POA: Insufficient documentation

## 2022-03-26 DIAGNOSIS — F419 Anxiety disorder, unspecified: Secondary | ICD-10-CM | POA: Diagnosis present

## 2022-03-26 DIAGNOSIS — Z91040 Latex allergy status: Secondary | ICD-10-CM | POA: Insufficient documentation

## 2022-03-26 NOTE — ED Triage Notes (Signed)
Pt admits to cocaine, meth and marijuana.  Pt with numbness to left arm, numbness to left finger tips currently.  Pt denies SI. Also c/o lower abd pain, + nausea.  Pt lives in her car.

## 2022-03-27 LAB — COMPREHENSIVE METABOLIC PANEL
ALT: 11 U/L (ref 0–44)
AST: 11 U/L — ABNORMAL LOW (ref 15–41)
Albumin: 4.4 g/dL (ref 3.5–5.0)
Alkaline Phosphatase: 63 U/L (ref 38–126)
Anion gap: 9 (ref 5–15)
BUN: 21 mg/dL — ABNORMAL HIGH (ref 6–20)
CO2: 22 mmol/L (ref 22–32)
Calcium: 9 mg/dL (ref 8.9–10.3)
Chloride: 108 mmol/L (ref 98–111)
Creatinine, Ser: 0.83 mg/dL (ref 0.44–1.00)
GFR, Estimated: >60 mL/min (ref >60)
Glucose, Bld: 99 mg/dL (ref 70–99)
Potassium: 3.8 mmol/L (ref 3.5–5.1)
Sodium: 139 mmol/L (ref 135–145)
Total Bilirubin: 0.9 mg/dL (ref 0.3–1.2)
Total Protein: 8.3 g/dL — ABNORMAL HIGH (ref 6.5–8.1)

## 2022-03-27 LAB — URINALYSIS, ROUTINE W REFLEX MICROSCOPIC
Bacteria, UA: NONE SEEN
Bilirubin Urine: NEGATIVE
Glucose, UA: NEGATIVE mg/dL
Ketones, ur: 20 mg/dL — AB
Leukocytes,Ua: NEGATIVE
Nitrite: NEGATIVE
Protein, ur: 30 mg/dL — AB
Specific Gravity, Urine: 1.028 (ref 1.005–1.030)
pH: 6 (ref 5.0–8.0)

## 2022-03-27 LAB — CBC WITH DIFFERENTIAL/PLATELET
Abs Immature Granulocytes: 0.01 10*3/uL (ref 0.00–0.07)
Basophils Absolute: 0 10*3/uL (ref 0.0–0.1)
Basophils Relative: 0 %
Eosinophils Absolute: 0 10*3/uL (ref 0.0–0.5)
Eosinophils Relative: 0 %
HCT: 39.5 % (ref 36.0–46.0)
Hemoglobin: 13.4 g/dL (ref 12.0–15.0)
Immature Granulocytes: 0 %
Lymphocytes Relative: 28 %
Lymphs Abs: 2.3 10*3/uL (ref 0.7–4.0)
MCH: 33 pg (ref 26.0–34.0)
MCHC: 33.9 g/dL (ref 30.0–36.0)
MCV: 97.3 fL (ref 80.0–100.0)
Monocytes Absolute: 0.4 10*3/uL (ref 0.1–1.0)
Monocytes Relative: 5 %
Neutro Abs: 5.4 10*3/uL (ref 1.7–7.7)
Neutrophils Relative %: 67 %
Platelets: 386 10*3/uL (ref 150–400)
RBC: 4.06 MIL/uL (ref 3.87–5.11)
RDW: 12.5 % (ref 11.5–15.5)
WBC: 8.2 10*3/uL (ref 4.0–10.5)
nRBC: 0 % (ref 0.0–0.2)

## 2022-03-27 LAB — POC URINE PREG, ED: Preg Test, Ur: NEGATIVE

## 2022-03-27 LAB — TROPONIN I (HIGH SENSITIVITY): Troponin I (High Sensitivity): 3 ng/L (ref <18)

## 2022-03-27 LAB — LIPASE, BLOOD: Lipase: 30 U/L (ref 11–51)

## 2022-03-27 MED ORDER — HYDROXYZINE HCL 25 MG PO TABS
50.0000 mg | ORAL_TABLET | Freq: Once | ORAL | Status: AC
  Administered 2022-03-27: 50 mg via ORAL
  Filled 2022-03-27: qty 2

## 2022-03-27 NOTE — ED Notes (Signed)
Pt is anxious and jittery to sounds and movement. Pt is asking for something to help with nerves at this time

## 2022-03-27 NOTE — ED Provider Notes (Signed)
[facility] EMERGENCY DEPARTMENT Provider Note   CSN: [id number] Arrival date & time: 03/26/22  1956     History  No chief complaint on file.   Katherine Burgess is a 41 y.o. female.  Presents to the emergency department with multiple complaints.  Reports using multiple drugs including crystal meth, cocaine, marijuana recently.  She started having tingling in her left arm and fingertips, became very anxious.  She had some nausea but no vomiting.       Home Medications Prior to Admission medications   Medication Sig Start Date End Date Taking? Authorizing Provider  albuterol (VENTOLIN HFA) 108 (90 Base) MCG/ACT inhaler Inhale 2 puffs into the lungs every 6 (six) hours as needed for wheezing or shortness of breath. Patient not taking: Reported on 10/24/2021    [provider]  ARIPiprazole (ABILIFY) 5 MG tablet Take 1 tablet (5 mg total) by mouth daily. 12/05/21   Lenard Lance, FNP  citalopram (CELEXA) 10 MG tablet Take 1 tablet (10 mg total) by mouth daily. 12/05/21   Lenard Lance, FNP  traZODone (DESYREL) 100 MG tablet Take 1 tablet (100 mg total) by mouth at bedtime as needed for sleep. 12/04/21   Lenard Lance, FNP      Allergies    Bactrim, Flagyl [metronidazole hcl], Lithium, Onion, Other, Peanuts [nuts], Sulfamethoxazole-trimethoprim, Latex, and Tape    Review of Systems   Review of Systems  Physical Exam Updated Vital Signs BP 133/87   Pulse 74   Temp 98.4 F (36.9 C) (Oral)   Resp 16   Ht 5' (1.524 m)   Wt 55.7 kg   LMP 03/24/2022   SpO2 99%   BMI 24.00 kg/m  Physical Exam Vitals and nursing note reviewed.  Constitutional:      General: She is not in acute distress.    Appearance: She is well-developed.  HENT:     Head: Normocephalic and atraumatic.     Mouth/Throat:     Mouth: Mucous membranes are moist.  Eyes:     General: Vision grossly intact. Gaze aligned appropriately.     Extraocular Movements: Extraocular movements intact.      Conjunctiva/sclera: Conjunctivae normal.  Cardiovascular:     Rate and Rhythm: Normal rate and regular rhythm.     Pulses: Normal pulses.     Heart sounds: Normal heart sounds, S1 normal and S2 normal. No murmur heard.    No friction rub. No gallop.  Pulmonary:     Effort: Pulmonary effort is normal. No respiratory distress.     Breath sounds: Normal breath sounds.  Abdominal:     General: Bowel sounds are normal.     Palpations: Abdomen is soft.     Tenderness: There is no abdominal tenderness. There is no guarding or rebound.     Hernia: No hernia is present.  Musculoskeletal:        General: No swelling.     Cervical back: Full passive range of motion without pain, normal range of motion and neck supple. No spinous process tenderness or muscular tenderness. Normal range of motion.     Right lower leg: No edema.     Left lower leg: No edema.  Skin:    General: Skin is warm and dry.     Capillary Refill: Capillary refill takes less than 2 seconds.     Findings: No ecchymosis, erythema, rash or wound.  Neurological:     General: No focal deficit present.  Mental Status: She is alert and oriented to person, place, and time.     GCS: GCS eye subscore is 4. GCS verbal subscore is 5. GCS motor subscore is 6.     Cranial Nerves: Cranial nerves 2-12 are intact.     Sensory: Sensation is intact.     Motor: Motor function is intact.     Coordination: Coordination is intact.  Psychiatric:        Attention and Perception: Attention normal.        Mood and Affect: Mood is anxious.        Speech: Speech normal.        Behavior: Behavior normal.     ED Results / Procedures / Treatments   Labs (all labs ordered are listed, but only abnormal results are displayed) Labs Reviewed  COMPREHENSIVE METABOLIC PANEL - Abnormal; Notable for the following components:      Result Value   BUN 21 (*)    Total Protein 8.3 (*)    AST 11 (*)    All other components within normal limits   URINALYSIS, ROUTINE W REFLEX MICROSCOPIC - Abnormal; Notable for the following components:   APPearance HAZY (*)    Hgb urine dipstick LARGE (*)    Ketones, ur 20 (*)    Protein, ur 30 (*)    All other components within normal limits  CBC WITH DIFFERENTIAL/PLATELET  LIPASE, BLOOD  POC URINE PREG, ED  TROPONIN I (HIGH SENSITIVITY)    EKG EKG Interpretation  Date/Time:  Wednesday March 26 2022 20:14:13 EDT Ventricular Rate:  71 PR Interval:  118 QRS Duration: 118 QT Interval:  410 QTC Calculation: 445 R Axis:   11 Text Interpretation: Normal sinus rhythm Right bundle branch block Abnormal ECG When compared with ECG of 02-Dec-2021 15:37, No significant change was found Confirmed by Gilda Crease [phone]) on 03/26/2022 11:34:50 PM  Radiology No results found.  Procedures Procedures    Medications Ordered in ED Medications  hydrOXYzine (ATARAX) tablet 50 mg (has no administration in time range)    ED Course/ Medical Decision Making/ A&P                           Medical Decision Making Amount and/or Complexity of Data Reviewed Labs: ordered.  Risk Prescription drug management.   Patient with subjective numbness of the left arm and hand that was transient, has resolved.  This is in the setting of polysubstance drug abuse.  No active chest pain.  Cardiac work-up unremarkable.  EKG unremarkable.  Remainder of neurologic exam is normal, no objective focal deficits on exam.  No concern for CVA.  Not experiencing any neck pain, fever -no risk factors or signs/symptoms of CNS infection including epidural abscess.  Patient anxious and tearful at times.  She is not homicidal or suicidal.  Treated with Vistaril.  Given resources for drug treatment.        Final Clinical Impression(s) / ED Diagnoses Final diagnoses:  Polysubstance abuse (HCC)  Panic attack    Rx / DC Orders ED Discharge Orders     None         Jacobi Nile, Canary Brim, MD 03/27/22  0148

## 2022-03-27 NOTE — ED Notes (Signed)
Pt given water to drink. 

## 2022-03-27 NOTE — ED Notes (Signed)
Pt went with [employer] to be escorted to pt's car. Pt refused to sign for discharge at this time.

## 2022-04-18 ENCOUNTER — Other Ambulatory Visit: Payer: Self-pay

## 2022-04-18 ENCOUNTER — Encounter (HOSPITAL_COMMUNITY): Payer: Self-pay | Admitting: *Deleted

## 2022-04-18 ENCOUNTER — Emergency Department (HOSPITAL_COMMUNITY): Payer: Medicaid Other

## 2022-04-18 ENCOUNTER — Emergency Department (HOSPITAL_COMMUNITY)
Admission: EM | Admit: 2022-04-18 | Discharge: 2022-04-18 | Disposition: A | Discharge status: Home / Self Care | Payer: Medicaid Other | Attending: Emergency Medicine | Admitting: Emergency Medicine

## 2022-04-18 DIAGNOSIS — Z91010 Allergy to peanuts: Secondary | ICD-10-CM | POA: Insufficient documentation

## 2022-04-18 DIAGNOSIS — J45909 Unspecified asthma, uncomplicated: Secondary | ICD-10-CM | POA: Insufficient documentation

## 2022-04-18 DIAGNOSIS — R1084 Generalized abdominal pain: Secondary | ICD-10-CM | POA: Insufficient documentation

## 2022-04-18 DIAGNOSIS — F17210 Nicotine dependence, cigarettes, uncomplicated: Secondary | ICD-10-CM | POA: Diagnosis not present

## 2022-04-18 DIAGNOSIS — F1910 Other psychoactive substance abuse, uncomplicated: Secondary | ICD-10-CM | POA: Diagnosis present

## 2022-04-18 DIAGNOSIS — Z90721 Acquired absence of ovaries, unilateral: Secondary | ICD-10-CM | POA: Insufficient documentation

## 2022-04-18 DIAGNOSIS — Z91040 Latex allergy status: Secondary | ICD-10-CM | POA: Insufficient documentation

## 2022-04-18 DIAGNOSIS — R072 Precordial pain: Secondary | ICD-10-CM | POA: Diagnosis not present

## 2022-04-18 DIAGNOSIS — Z202 Contact with and (suspected) exposure to infections with a predominantly sexual mode of transmission: Secondary | ICD-10-CM | POA: Diagnosis not present

## 2022-04-18 LAB — URINALYSIS, ROUTINE W REFLEX MICROSCOPIC
Bilirubin Urine: NEGATIVE
Glucose, UA: NEGATIVE mg/dL
Hgb urine dipstick: NEGATIVE
Ketones, ur: NEGATIVE mg/dL
Leukocytes,Ua: NEGATIVE
Nitrite: NEGATIVE
Protein, ur: NEGATIVE mg/dL
Specific Gravity, Urine: 1.019 (ref 1.005–1.030)
pH: 5 (ref 5.0–8.0)

## 2022-04-18 LAB — COMPREHENSIVE METABOLIC PANEL
ALT: 12 U/L (ref 0–44)
AST: 19 U/L (ref 15–41)
Albumin: 4.3 g/dL (ref 3.5–5.0)
Alkaline Phosphatase: 71 U/L (ref 38–126)
Anion gap: 14 (ref 5–15)
BUN: 9 mg/dL (ref 6–20)
CO2: 19 mmol/L — ABNORMAL LOW (ref 22–32)
Calcium: 9.3 mg/dL (ref 8.9–10.3)
Chloride: 106 mmol/L (ref 98–111)
Creatinine, Ser: 0.78 mg/dL (ref 0.44–1.00)
GFR, Estimated: >60 mL/min (ref >60)
Glucose, Bld: 111 mg/dL — ABNORMAL HIGH (ref 70–99)
Potassium: 3.2 mmol/L — ABNORMAL LOW (ref 3.5–5.1)
Sodium: 139 mmol/L (ref 135–145)
Total Bilirubin: 0.6 mg/dL (ref 0.3–1.2)
Total Protein: 8.7 g/dL — ABNORMAL HIGH (ref 6.5–8.1)

## 2022-04-18 LAB — CBC WITH DIFFERENTIAL/PLATELET
Abs Immature Granulocytes: 0.02 10*3/uL (ref 0.00–0.07)
Basophils Absolute: 0 10*3/uL (ref 0.0–0.1)
Basophils Relative: 0 %
Eosinophils Absolute: 0 10*3/uL (ref 0.0–0.5)
Eosinophils Relative: 1 %
HCT: 41.5 % (ref 36.0–46.0)
Hemoglobin: 14.1 g/dL (ref 12.0–15.0)
Immature Granulocytes: 0 %
Lymphocytes Relative: 23 %
Lymphs Abs: 1.4 10*3/uL (ref 0.7–4.0)
MCH: 32.7 pg (ref 26.0–34.0)
MCHC: 34 g/dL (ref 30.0–36.0)
MCV: 96.3 fL (ref 80.0–100.0)
Monocytes Absolute: 0.5 10*3/uL (ref 0.1–1.0)
Monocytes Relative: 8 %
Neutro Abs: 4.2 10*3/uL (ref 1.7–7.7)
Neutrophils Relative %: 68 %
Platelets: 433 10*3/uL — ABNORMAL HIGH (ref 150–400)
RBC: 4.31 MIL/uL (ref 3.87–5.11)
RDW: 13.1 % (ref 11.5–15.5)
WBC: 6.2 10*3/uL (ref 4.0–10.5)
nRBC: 0 % (ref 0.0–0.2)

## 2022-04-18 LAB — TROPONIN I (HIGH SENSITIVITY)
Troponin I (High Sensitivity): <2 ng/L (ref <18)
Troponin I (High Sensitivity): <2 ng/L (ref <18)

## 2022-04-18 LAB — RAPID URINE DRUG SCREEN, HOSP PERFORMED
Amphetamines: POSITIVE — AB
Barbiturates: NOT DETECTED
Benzodiazepines: POSITIVE — AB
Cocaine: POSITIVE — AB
Opiates: POSITIVE — AB
Tetrahydrocannabinol: POSITIVE — AB

## 2022-04-18 LAB — RAPID HIV SCREEN (HIV 1/2 AB+AG)
HIV 1/2 Antibodies: NONREACTIVE
HIV-1 P24 Antigen - HIV24: NONREACTIVE

## 2022-04-18 LAB — PREGNANCY, URINE: Preg Test, Ur: NEGATIVE

## 2022-04-18 LAB — LIPASE, BLOOD: Lipase: 26 U/L (ref 11–51)

## 2022-04-18 MED ORDER — SODIUM CHLORIDE 0.9 % IV SOLN: INTRAVENOUS | Status: DC

## 2022-04-18 MED ORDER — AZITHROMYCIN 250 MG PO TABS
1000.0000 mg | ORAL_TABLET | Freq: Once | ORAL | Status: AC
  Administered 2022-04-18: 1000 mg via ORAL
  Filled 2022-04-18: qty 4

## 2022-04-18 MED ORDER — LORAZEPAM 2 MG/ML IJ SOLN
1.0000 mg | Freq: Once | INTRAMUSCULAR | Status: AC
  Administered 2022-04-18: 1 mg via INTRAVENOUS
  Filled 2022-04-18: qty 1

## 2022-04-18 MED ORDER — IOHEXOL 300 MG/ML  SOLN
100.0000 mL | Freq: Once | INTRAMUSCULAR | Status: AC | PRN
  Administered 2022-04-18: 100 mL via INTRAVENOUS

## 2022-04-18 MED ORDER — SODIUM CHLORIDE 0.9 % IV SOLN
1.0000 g | Freq: Once | INTRAVENOUS | Status: AC
  Administered 2022-04-18: 1 g via INTRAVENOUS
  Filled 2022-04-18: qty 10
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
## 2022-04-18 NOTE — Discharge Instructions (Signed)
Follow-up as needed.  To be contacted if your culture results are positive.  CT scan of the abdomen without any acute findings.  Work-up for the chest pain without any evidence of any concerns for cardiac events.  CT scan did show a little bit of enlarged adrenal gland on the one side recommend follow-up for follow-up CT scans isolated to that you can see your primary care doctor to have that arranged.

## 2022-04-18 NOTE — ED Provider Notes (Addendum)
[facility] EMERGENCY DEPARTMENT Provider Note   CSN: [id number] Arrival date & time: 04/18/22  1813     History  Chief Complaint  Patient presents with   Shortness of Breath    Katherine Burgess is a 41 y.o. female.  Patient brought in by POV.  Multiple complaints.  Patient admits to substance abuse in the morning that took several Dilaudid pills.  Patient also recently was doing meth.  Got a burning sensation in the back of her throat during that.  Patient also had consensual intercourse and is concerned about STD exposure.  Patient also with abdominal pain mostly around the periumbilical area but somewhat generalized since that intercourse.  No nausea or vomiting.  Patient also states that she smoked marijuana.  Review shows that patient is seen at times for substance abuse.  Patient most recently seen September 13 for polysubstance abuse.  Patient also seen May 22 and April 13 for substance abuse.  Past medical history sniffing for depression asthma bipolar disorder posttraumatic stress disorder chronic pelvic pain in a female polysubstance abuse history of kidney stones.  Past surgical history is significant for unilateral salpingectomy and for lysis of adhesions in 2015.  Patient is an everyday smoker smokes 2 packs a day.       Home Medications Prior to Admission medications   Medication Sig Start Date End Date Taking? Authorizing Provider  albuterol (VENTOLIN HFA) 108 (90 Base) MCG/ACT inhaler Inhale 2 puffs into the lungs every 6 (six) hours as needed for wheezing or shortness of breath. Patient not taking: Reported on 10/24/2021    [provider]  ARIPiprazole (ABILIFY) 5 MG tablet Take 1 tablet (5 mg total) by mouth daily. 12/05/21   Lenard Lance, FNP  citalopram (CELEXA) 10 MG tablet Take 1 tablet (10 mg total) by mouth daily. 12/05/21   Lenard Lance, FNP  traZODone (DESYREL) 100 MG tablet Take 1 tablet (100 mg total) by mouth at bedtime as needed for sleep. 12/04/21    Lenard Lance, FNP      Allergies    Bactrim, Flagyl [metronidazole hcl], Lithium, Onion, Other, Peanuts [nuts], Sulfamethoxazole-trimethoprim, Latex, and Tape    Review of Systems   Review of Systems  Constitutional:  Negative for chills and fever.  HENT:  Negative for ear pain and sore throat.   Eyes:  Negative for pain and visual disturbance.  Respiratory:  Negative for cough and shortness of breath.   Cardiovascular:  Positive for chest pain. Negative for palpitations.  Gastrointestinal:  Positive for abdominal pain. Negative for vomiting.  Genitourinary:  Negative for dysuria and hematuria.  Musculoskeletal:  Negative for arthralgias and back pain.  Skin:  Negative for color change and rash.  Neurological:  Negative for seizures and syncope.  All other systems reviewed and are negative.   Physical Exam Updated Vital Signs BP 136/78   Pulse 79   Temp 99 F (37.2 C) (Oral)   Resp 16   LMP 03/24/2022   SpO2 97%  Physical Exam Vitals and nursing note reviewed.  Constitutional:      General: She is not in acute distress.    Appearance: She is well-developed.  HENT:     Head: Normocephalic and atraumatic.     Mouth/Throat:     Pharynx: Oropharynx is clear.     Comments: No uvula swelling its midline.  A little bit of slight erythema no exudate with the tonsils.  No tongue lesions. Eyes:  Conjunctiva/sclera: Conjunctivae normal.  Cardiovascular:     Rate and Rhythm: Normal rate and regular rhythm.     Heart sounds: No murmur heard. Pulmonary:     Effort: Pulmonary effort is normal. No respiratory distress.     Breath sounds: Normal breath sounds. No decreased breath sounds, wheezing, rhonchi or rales.  Chest:     Chest wall: No tenderness.  Abdominal:     Palpations: Abdomen is soft.     Tenderness: There is no abdominal tenderness. There is no guarding.  Musculoskeletal:        General: No swelling.     Cervical back: Neck supple.  Skin:    General: Skin is  warm and dry.     Capillary Refill: Capillary refill takes less than 2 seconds.  Neurological:     General: No focal deficit present.     Mental Status: She is alert and oriented to person, place, and time.  Psychiatric:        Mood and Affect: Mood normal.     ED Results / Procedures / Treatments   Labs (all labs ordered are listed, but only abnormal results are displayed) Labs Reviewed  COMPREHENSIVE METABOLIC PANEL - Abnormal; Notable for the following components:      Result Value   Potassium 3.2 (*)    CO2 19 (*)    Glucose, Bld 111 (*)    Total Protein 8.7 (*)    All other components within normal limits  CBC WITH DIFFERENTIAL/PLATELET - Abnormal; Notable for the following components:   Platelets 433 (*)    All other components within normal limits  RAPID URINE DRUG SCREEN, HOSP PERFORMED - Abnormal; Notable for the following components:   Opiates POSITIVE (*)    Cocaine POSITIVE (*)    Benzodiazepines POSITIVE (*)    Amphetamines POSITIVE (*)    Tetrahydrocannabinol POSITIVE (*)    All other components within normal limits  URINALYSIS, ROUTINE W REFLEX MICROSCOPIC - Abnormal; Notable for the following components:   APPearance HAZY (*)    All other components within normal limits  LIPASE, BLOOD  PREGNANCY, URINE  RPR  HIV ANTIBODY (ROUTINE TESTING W REFLEX)  RAPID HIV SCREEN (HIV 1/2 AB+AG)  GC/CHLAMYDIA PROBE AMP ([place]) NOT AT [facility]  TROPONIN I (HIGH SENSITIVITY)  TROPONIN I (HIGH SENSITIVITY)    EKG EKG Interpretation  Date/Time:  Friday April 18 2022 18:29:21 EDT Ventricular Rate:  91 PR Interval:  107 QRS Duration: 118 QT Interval:  377 QTC Calculation: 464 R Axis:   -28 Text Interpretation: Sinus rhythm Short PR interval Incomplete RBBB and LAFB No significant change since last tracing Confirmed by Vanetta Mulders [phone]) on 04/18/2022 6:32:30 PM  Radiology CT Abdomen Pelvis W Contrast  Result Date: 04/18/2022 CLINICAL DATA:  Acute  abdominal pain. EXAM: CT ABDOMEN AND PELVIS WITH CONTRAST TECHNIQUE: Multidetector CT imaging of the abdomen and pelvis was performed using the standard protocol following bolus administration of intravenous contrast. RADIATION DOSE REDUCTION: This exam was performed according to the departmental dose-optimization program which includes automated exposure control, adjustment of the mA and/or kV according to patient size and/or use of iterative reconstruction technique. CONTRAST:  OMNIPAQUE IOHEXOL 300 MG/ML  SOLN COMPARISON:  None Available. FINDINGS: Lower chest: No acute abnormality. Hepatobiliary: No focal liver abnormality is seen. No gallstones, gallbladder wall thickening, or biliary dilatation. The dome of the liver has been excluded from the exam. Pancreas: Unremarkable. No pancreatic ductal dilatation or surrounding inflammatory changes. Spleen: Normal  in size without focal abnormality. Adrenals/Urinary Tract: Limited evaluation secondary to motion artifact. There is an indeterminate left adrenal nodule measuring 1.5 by 1.2 cm in 33 Hounsfield units. There is a rounded hypodensity in the left kidney which is too small to characterize, likely a cyst. Otherwise, the kidneys and bladder are within normal limits. Stomach/Bowel: Stomach is within normal limits. Appendix appears normal. No evidence of bowel wall thickening, distention, or inflammatory changes. Vascular/Lymphatic: Aortic atherosclerosis. No enlarged abdominal or pelvic lymph nodes. Reproductive: Uterus and bilateral adnexa are unremarkable. Other: No abdominal wall hernia or abnormality. No abdominopelvic ascites. Musculoskeletal: No acute or significant osseous findings. IMPRESSION: 1. No acute process in the abdomen or pelvis. 2. Indeterminate left adrenal nodule measuring 1.5 cm. Recommend further evaluation with adrenal protocol CT or MRI. 3. Subcentimeter left Bosniak II renal cyst, too small to characterize. No follow-up imaging is  recommended. JACR 2018 Feb; 264-273, Management of the Incidental RenalMass on CT, [employer] 2021; 814-848, Bosniak Classification of Cystic Renal Masses, Version 2019. Aortic Atherosclerosis (ICD10-I70.0). Electronically Signed   By: Darliss Cheney M.D.   On: 04/18/2022 20:39   DG Chest Port 1 View  Result Date: 04/18/2022 CLINICAL DATA:  Shortness of breath. EXAM: PORTABLE CHEST 1 VIEW COMPARISON:  October 24, 2021 FINDINGS: The heart size and mediastinal contours are within normal limits. There is no evidence of acute infiltrate, pleural effusion or pneumothorax. The visualized skeletal structures are unremarkable. IMPRESSION: No active disease. Electronically Signed   By: Aram Candela M.D.   On: 04/18/2022 19:44    Procedures Procedures    Medications Ordered in ED Medications  0.9 %  sodium chloride infusion (0 mLs Intravenous Stopped 04/18/22 2113)  azithromycin (ZITHROMAX) tablet 1,000 mg (1,000 mg Oral Given 04/18/22 2022)  cefTRIAXone (ROCEPHIN) 1 g in sodium chloride 0.9 % 100 mL IVPB (0 g Intravenous Stopped 04/18/22 2043)  iohexol (OMNIPAQUE) 300 MG/ML solution 100 mL (100 mLs Intravenous Contrast Given 04/18/22 2010)  LORazepam (ATIVAN) injection 1 mg (1 mg Intravenous Given 04/18/22 2112)    ED Course/ Medical Decision Making/ A&P                           Medical Decision Making Amount and/or Complexity of Data Reviewed Labs: ordered. Radiology: ordered.  Risk Prescription drug management.   Sent with multiple complaints.  Chest pain after meth use initial troponin normal will check delta troponin.  Chest x-ray without acute findings.  CT scan of the abdomen no acute process in the abdomen or pelvis some evidence of indeterminate left adrenal nodule measuring 1.5 cm they recommend further follow-up with adrenal protocol CT.  Left renal cyst no follow-up imaging is required.  Chest x-ray no active disease.  Drug screen positive for opiates cocaine benzos.  CBC no  leukocytosis hemoglobin 14.1 complete metabolic panel potassium down a little bit at 3.2 liver function test are normal renal function test is normal.  Pregnancy test is negative urinalysis is negative for urinary tract infection.  But was hazy.  For the STD exposure concern patient be treated with Rocephin and azithromycin.  We will get GC chlamydia from the urine.  Patient did state that the intercourse was consensual.  Delta troponin both less than 2.  Patient treated with Rocephin and Zithromax for the STD exposure.  Patient stable for discharge home.  Final Clinical Impression(s) / ED Diagnoses Final diagnoses:  Polysubstance abuse (HCC)  Precordial pain  Generalized abdominal pain  Possible exposure to STD    Rx / DC Orders ED Discharge Orders     None         Fredia Sorrow, MD 04/18/22 2128    Fredia Sorrow, MD 04/18/22 2141

## 2022-04-18 NOTE — ED Triage Notes (Signed)
Pt with c/o SOB starting 10-15 minutes ago.  Admits to using meth this morning and marijuana. Pt with c/o lower abd pain as well.
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
## 2022-04-19 LAB — HIV ANTIBODY (ROUTINE TESTING W REFLEX): HIV Screen 4th Generation wRfx: NONREACTIVE

## 2022-04-20 LAB — RPR: RPR Ser Ql: NONREACTIVE

## 2022-04-25 ENCOUNTER — Other Ambulatory Visit: Payer: Self-pay

## 2022-04-25 ENCOUNTER — Emergency Department (HOSPITAL_COMMUNITY): Payer: Medicaid Other

## 2022-04-25 ENCOUNTER — Emergency Department (HOSPITAL_COMMUNITY)
Admission: EM | Admit: 2022-04-25 | Discharge: 2022-04-25 | Disposition: A | Discharge status: Home / Self Care | Payer: Medicaid Other | Attending: Emergency Medicine | Admitting: Emergency Medicine

## 2022-04-25 DIAGNOSIS — F1910 Other psychoactive substance abuse, uncomplicated: Secondary | ICD-10-CM

## 2022-04-25 DIAGNOSIS — R079 Chest pain, unspecified: Secondary | ICD-10-CM | POA: Diagnosis present

## 2022-04-25 DIAGNOSIS — Z91040 Latex allergy status: Secondary | ICD-10-CM | POA: Insufficient documentation

## 2022-04-25 DIAGNOSIS — R0789 Other chest pain: Secondary | ICD-10-CM

## 2022-04-25 DIAGNOSIS — Z91010 Allergy to peanuts: Secondary | ICD-10-CM | POA: Insufficient documentation

## 2022-04-25 DIAGNOSIS — F1490 Cocaine use, unspecified, uncomplicated: Secondary | ICD-10-CM | POA: Insufficient documentation

## 2022-04-25 DIAGNOSIS — Z87891 Personal history of nicotine dependence: Secondary | ICD-10-CM | POA: Insufficient documentation

## 2022-04-25 LAB — BASIC METABOLIC PANEL
Anion gap: 8 (ref 5–15)
BUN: 17 mg/dL (ref 6–20)
CO2: 22 mmol/L (ref 22–32)
Calcium: 8.5 mg/dL — ABNORMAL LOW (ref 8.9–10.3)
Chloride: 110 mmol/L (ref 98–111)
Creatinine, Ser: 0.72 mg/dL (ref 0.44–1.00)
GFR, Estimated: >60 mL/min (ref >60)
Glucose, Bld: 102 mg/dL — ABNORMAL HIGH (ref 70–99)
Potassium: 3.7 mmol/L (ref 3.5–5.1)
Sodium: 140 mmol/L (ref 135–145)

## 2022-04-25 LAB — URINALYSIS, ROUTINE W REFLEX MICROSCOPIC
Bilirubin Urine: NEGATIVE
Glucose, UA: NEGATIVE mg/dL
Hgb urine dipstick: NEGATIVE
Ketones, ur: 5 mg/dL — AB
Leukocytes,Ua: NEGATIVE
Nitrite: NEGATIVE
Protein, ur: 30 mg/dL — AB
Specific Gravity, Urine: 1.025 (ref 1.005–1.030)
pH: 5 (ref 5.0–8.0)

## 2022-04-25 LAB — RAPID URINE DRUG SCREEN, HOSP PERFORMED
Amphetamines: POSITIVE — AB
Barbiturates: NOT DETECTED
Benzodiazepines: NOT DETECTED
Cocaine: POSITIVE — AB
Opiates: POSITIVE — AB
Tetrahydrocannabinol: POSITIVE — AB

## 2022-04-25 LAB — CBC
HCT: 37.6 % (ref 36.0–46.0)
Hemoglobin: 12.5 g/dL (ref 12.0–15.0)
MCH: 32.1 pg (ref 26.0–34.0)
MCHC: 33.2 g/dL (ref 30.0–36.0)
MCV: 96.4 fL (ref 80.0–100.0)
Platelets: 487 10*3/uL — ABNORMAL HIGH (ref 150–400)
RBC: 3.9 MIL/uL (ref 3.87–5.11)
RDW: 12.9 % (ref 11.5–15.5)
WBC: 7.4 10*3/uL (ref 4.0–10.5)
nRBC: 0 % (ref 0.0–0.2)

## 2022-04-25 LAB — TROPONIN I (HIGH SENSITIVITY)
Troponin I (High Sensitivity): 2 ng/L (ref <18)
Troponin I (High Sensitivity): 2 ng/L (ref <18)

## 2022-04-25 LAB — CBG MONITORING, ED: Glucose-Capillary: 90 mg/dL (ref 70–99)

## 2022-04-25 LAB — POC URINE PREG, ED: Preg Test, Ur: NEGATIVE

## 2022-04-25 LAB — ETHANOL: Alcohol, Ethyl (B): <10 mg/dL (ref <10)

## 2022-04-25 MED ORDER — LORAZEPAM 2 MG/ML IJ SOLN
1.0000 mg | Freq: Once | INTRAMUSCULAR | Status: AC
  Administered 2022-04-25: 1 mg via INTRAVENOUS
  Filled 2022-04-25: qty 1

## 2022-04-25 MED ORDER — ASPIRIN 81 MG PO CHEW
324.0000 mg | CHEWABLE_TABLET | Freq: Once | ORAL | Status: AC
  Administered 2022-04-25: 324 mg via ORAL
  Filled 2022-04-25: qty 4

## 2022-04-25 MED ORDER — SODIUM CHLORIDE 0.9 % IV BOLUS
1000.0000 mL | Freq: Once | INTRAVENOUS | Status: AC
  Administered 2022-04-25: 1000 mL via INTRAVENOUS

## 2022-04-25 MED ORDER — LORAZEPAM 1 MG PO TABS: 1.0000 mg | ORAL_TABLET | Freq: Once | ORAL | Status: DC
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
## 2022-04-25 NOTE — Discharge Instructions (Addendum)
As discussed, your evaluation today has been largely reassuring.  But, it is important that you monitor your condition carefully, and do not hesitate to return to the ED if you develop new, or concerning changes in your condition. ? ?Otherwise, please follow-up with your physician for appropriate ongoing care. ? ?
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
## 2022-04-25 NOTE — ED Notes (Signed)
Patient in room spitting white foam from mouth in floor. Convinced that she has something stuck in her throat and cannot swallow. Patient speaking in complete sentences, airway does not appear obstructed. MD notified.
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
## 2024-04-22 ENCOUNTER — Emergency Department (HOSPITAL_COMMUNITY)
Admission: EM | Admit: 2024-04-22 | Discharge: 2024-04-22 | Disposition: A | Discharge status: Home / Self Care | Attending: Emergency Medicine | Admitting: Emergency Medicine

## 2024-04-22 ENCOUNTER — Ambulatory Visit (HOSPITAL_COMMUNITY)
Admission: EM | Admit: 2024-04-22 | Discharge: 2024-04-22 | Disposition: A | Discharge status: Home / Self Care | Source: Ambulatory Visit | Attending: Emergency Medicine | Admitting: Emergency Medicine

## 2024-04-22 ENCOUNTER — Emergency Department (HOSPITAL_COMMUNITY)

## 2024-04-22 ENCOUNTER — Telehealth: Payer: Self-pay

## 2024-04-22 DIAGNOSIS — S0990XA Unspecified injury of head, initial encounter: Secondary | ICD-10-CM | POA: Diagnosis not present

## 2024-04-22 DIAGNOSIS — Z0441 Encounter for examination and observation following alleged adult rape: Secondary | ICD-10-CM | POA: Diagnosis present

## 2024-04-22 DIAGNOSIS — Y92039 Unspecified place in apartment as the place of occurrence of the external cause: Secondary | ICD-10-CM | POA: Insufficient documentation

## 2024-04-22 DIAGNOSIS — T7421XA Adult sexual abuse, confirmed, initial encounter: Secondary | ICD-10-CM | POA: Insufficient documentation

## 2024-04-22 DIAGNOSIS — R1030 Lower abdominal pain, unspecified: Secondary | ICD-10-CM

## 2024-04-22 DIAGNOSIS — R519 Headache, unspecified: Secondary | ICD-10-CM | POA: Diagnosis present

## 2024-04-22 LAB — HEPATIC FUNCTION PANEL
ALT: 11 U/L (ref 0–44)
AST: 13 U/L — ABNORMAL LOW (ref 15–41)
Albumin: 4 g/dL (ref 3.5–5.0)
Alkaline Phosphatase: 67 U/L (ref 38–126)
Bilirubin, Direct: <0.1 mg/dL (ref 0.0–0.2)
Total Bilirubin: <0.2 mg/dL (ref 0.0–1.2)
Total Protein: 6.8 g/dL (ref 6.5–8.1)

## 2024-04-22 LAB — CBC WITH DIFFERENTIAL/PLATELET
Abs Immature Granulocytes: 0.02 K/uL (ref 0.00–0.07)
Basophils Absolute: 0 K/uL (ref 0.0–0.1)
Basophils Relative: 0 %
Eosinophils Absolute: 0.1 K/uL (ref 0.0–0.5)
Eosinophils Relative: 1 %
HCT: 34.4 % — ABNORMAL LOW (ref 36.0–46.0)
Hemoglobin: 11.3 g/dL — ABNORMAL LOW (ref 12.0–15.0)
Immature Granulocytes: 0 %
Lymphocytes Relative: 33 %
Lymphs Abs: 1.9 K/uL (ref 0.7–4.0)
MCH: 32.7 pg (ref 26.0–34.0)
MCHC: 32.8 g/dL (ref 30.0–36.0)
MCV: 99.4 fL (ref 80.0–100.0)
Monocytes Absolute: 0.3 K/uL (ref 0.1–1.0)
Monocytes Relative: 6 %
Neutro Abs: 3.3 K/uL (ref 1.7–7.7)
Neutrophils Relative %: 60 %
Platelets: 334 K/uL (ref 150–400)
RBC: 3.46 MIL/uL — ABNORMAL LOW (ref 3.87–5.11)
RDW: 14 % (ref 11.5–15.5)
WBC: 5.6 K/uL (ref 4.0–10.5)
nRBC: 0 % (ref 0.0–0.2)

## 2024-04-22 LAB — URINE DRUG SCREEN
Amphetamines: NEGATIVE
Barbiturates: NEGATIVE
Benzodiazepines: NEGATIVE
Cocaine: NEGATIVE
Fentanyl: NEGATIVE
Methadone Scn, Ur: NEGATIVE
Opiates: POSITIVE — AB
Tetrahydrocannabinol: POSITIVE — AB

## 2024-04-22 LAB — RAPID HIV SCREEN (HIV 1/2 AB+AG)
HIV 1/2 Antibodies: NONREACTIVE
HIV-1 P24 Antigen - HIV24: NONREACTIVE

## 2024-04-22 LAB — URINALYSIS, ROUTINE W REFLEX MICROSCOPIC
Bilirubin Urine: NEGATIVE
Glucose, UA: NEGATIVE mg/dL
Hgb urine dipstick: NEGATIVE
Ketones, ur: NEGATIVE mg/dL
Leukocytes,Ua: NEGATIVE
Nitrite: POSITIVE — AB
Protein, ur: NEGATIVE mg/dL
Specific Gravity, Urine: 1.016 (ref 1.005–1.030)
pH: 5 (ref 5.0–8.0)

## 2024-04-22 LAB — BASIC METABOLIC PANEL WITH GFR
Anion gap: 9 (ref 5–15)
BUN: 9 mg/dL (ref 6–20)
CO2: 22 mmol/L (ref 22–32)
Calcium: 9.2 mg/dL (ref 8.9–10.3)
Chloride: 105 mmol/L (ref 98–111)
Creatinine, Ser: 0.75 mg/dL (ref 0.44–1.00)
GFR, Estimated: >60 mL/min (ref >60)
Glucose, Bld: 98 mg/dL (ref 70–99)
Potassium: 3.9 mmol/L (ref 3.5–5.1)
Sodium: 137 mmol/L (ref 135–145)

## 2024-04-22 LAB — PREGNANCY, URINE: Preg Test, Ur: NEGATIVE

## 2024-04-22 LAB — POC URINE PREG, ED: Preg Test, Ur: NEGATIVE

## 2024-04-22 MED ORDER — AZITHROMYCIN 250 MG PO TABS
1000.0000 mg | ORAL_TABLET | Freq: Once | ORAL | Status: AC
  Administered 2024-04-22: 1000 mg via ORAL
  Filled 2024-04-22: qty 4

## 2024-04-22 MED ORDER — HYDROCODONE-ACETAMINOPHEN 5-325 MG PO TABS
1.0000 | ORAL_TABLET | Freq: Once | ORAL | Status: AC
  Administered 2024-04-22: 1 via ORAL
  Filled 2024-04-22: qty 1

## 2024-04-22 MED ORDER — IOHEXOL 300 MG/ML  SOLN
100.0000 mL | Freq: Once | INTRAMUSCULAR | Status: AC | PRN
  Administered 2024-04-22: 100 mL via INTRAVENOUS

## 2024-04-22 MED ORDER — CEFTRIAXONE SODIUM 500 MG IJ SOLR
500.0000 mg | Freq: Once | INTRAMUSCULAR | Status: AC
  Administered 2024-04-22: 500 mg via INTRAMUSCULAR
  Filled 2024-04-22: qty 500

## 2024-04-22 MED ORDER — ULIPRISTAL ACETATE 30 MG PO TABS
30.0000 mg | ORAL_TABLET | Freq: Once | ORAL | Status: AC
  Administered 2024-04-22: 30 mg via ORAL
  Filled 2024-04-22: qty 1

## 2024-04-22 MED ORDER — ONDANSETRON 4 MG PO TBDP
4.0000 mg | ORAL_TABLET | Freq: Once | ORAL | Status: AC
  Administered 2024-04-22: 4 mg via ORAL
  Filled 2024-04-22: qty 1

## 2024-04-22 MED ORDER — LIDOCAINE HCL (PF) 1 % IJ SOLN
1.0000 mL | Freq: Once | INTRAMUSCULAR | Status: AC
  Administered 2024-04-22: 1 mL
  Filled 2024-04-22: qty 5
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
## 2024-04-22 NOTE — Discharge Instructions (Addendum)
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 sexually transmitted infection (STI), avoid sexual contact until you have finished your treatment. Your partner may also need treatment.  What side effects may I notice from receiving this medication? Side effects that you should report to your care team as soon as possible: Allergic reactions--skin rash, itching, hives, swelling of the face, lips, tongue, or throat Hemolytic anemia--unusual weakness or fatigue, dizziness, headache, trouble breathing, dark urine, yellowing skin or eyes Severe diarrhea, fever Unusual vaginal discharge, itching, or odor Side effects that usually do not require medical attention (report to your care team if they continue or are  bothersome): Diarrhea Headache Nausea Pain, redness, or irritation at injection site  This list may not describe all possible side effects. Call your doctor for medical advice about side effects. You may report side effects to FDA at 1-800-FDA-1088.  Where should I keep my medication? Keep out of the reach of children and pets. You will be instructed on how to store this medication. Get rid of any unused medication after the expiration date. To get rid of medications that are no longer needed or have expired: Take the medication to a medication take-back program. Check with your pharmacy or law enforcement to find a location. If you cannot return the medication, ask your pharmacist or care team how to get rid of this medication safely.  NOTE: This sheet is a summary. It may not cover all possible information. If you have questions about this medicine, talk to your doctor, pharmacist, or health care provider.  2024 Elsevier/Gold Standard (2021-09-09 00:00:00)
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
## 2024-04-22 NOTE — ED Notes (Signed)
 Pt has finished with the sane nurse. Awaiting Ct scan
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
## 2024-04-23 ENCOUNTER — Other Ambulatory Visit: Payer: Self-pay

## 2024-04-23 ENCOUNTER — Encounter (HOSPITAL_COMMUNITY): Payer: Self-pay

## 2024-04-23 ENCOUNTER — Emergency Department (HOSPITAL_COMMUNITY)
Admission: EM | Admit: 2024-04-23 | Discharge: 2024-04-23 | Discharge status: Against Medical Advice | Attending: Emergency Medicine | Admitting: Emergency Medicine

## 2024-04-23 DIAGNOSIS — Z5321 Procedure and treatment not carried out due to patient leaving prior to being seen by health care provider: Secondary | ICD-10-CM | POA: Diagnosis not present

## 2024-04-23 DIAGNOSIS — R109 Unspecified abdominal pain: Secondary | ICD-10-CM | POA: Insufficient documentation

## 2024-04-23 LAB — HEPATITIS C ANTIBODY: HCV Ab: NONREACTIVE — AB

## 2024-04-23 LAB — RPR: RPR Ser Ql: NONREACTIVE

## 2024-04-23 NOTE — ED Notes (Signed)
 This tech called pt to go into [facility] and she states I'm leaving I dont want your shitty services. RN notified.

## 2024-04-23 NOTE — ED Notes (Signed)
 Pt stopped this tech and asked why other people were going back before her and that wasn't fair, explained to her that it is based on acuity level and we do not have any beds at this moment and I cannot give her an actual time limit. She asked should she go somewhere else and I explained to the pt that is at her discretion. Pt is getting angry.
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
## 2024-04-25 LAB — GC/CHLAMYDIA PROBE AMP (~~LOC~~) NOT AT ARMC
Chlamydia: NEGATIVE
Comment: NEGATIVE
Comment: NORMAL
Neisseria Gonorrhea: NEGATIVE

## 2024-04-25 LAB — HEPATITIS B SURFACE ANTIGEN

## 2024-04-28 ENCOUNTER — Emergency Department (HOSPITAL_COMMUNITY)

## 2024-04-28 ENCOUNTER — Other Ambulatory Visit: Payer: Self-pay

## 2024-04-28 ENCOUNTER — Emergency Department (HOSPITAL_COMMUNITY)
Admission: EM | Admit: 2024-04-28 | Discharge: 2024-04-28 | Disposition: A | Discharge status: Home / Self Care | Attending: Emergency Medicine | Admitting: Emergency Medicine

## 2024-04-28 ENCOUNTER — Encounter (HOSPITAL_COMMUNITY): Payer: Self-pay

## 2024-04-28 DIAGNOSIS — Z91040 Latex allergy status: Secondary | ICD-10-CM | POA: Insufficient documentation

## 2024-04-28 DIAGNOSIS — R519 Headache, unspecified: Secondary | ICD-10-CM | POA: Insufficient documentation

## 2024-04-28 DIAGNOSIS — R079 Chest pain, unspecified: Secondary | ICD-10-CM | POA: Insufficient documentation

## 2024-04-28 DIAGNOSIS — Z91010 Allergy to peanuts: Secondary | ICD-10-CM | POA: Diagnosis not present

## 2024-04-28 DIAGNOSIS — F17200 Nicotine dependence, unspecified, uncomplicated: Secondary | ICD-10-CM | POA: Diagnosis not present

## 2024-04-28 LAB — CBC WITH DIFFERENTIAL/PLATELET
Abs Immature Granulocytes: 0.01 K/uL (ref 0.00–0.07)
Basophils Absolute: 0 K/uL (ref 0.0–0.1)
Basophils Relative: 0 %
Eosinophils Absolute: 0.1 K/uL (ref 0.0–0.5)
Eosinophils Relative: 2 %
HCT: 38.9 % (ref 36.0–46.0)
Hemoglobin: 12.7 g/dL (ref 12.0–15.0)
Immature Granulocytes: 0 %
Lymphocytes Relative: 34 %
Lymphs Abs: 1.9 K/uL (ref 0.7–4.0)
MCH: 32.6 pg (ref 26.0–34.0)
MCHC: 32.6 g/dL (ref 30.0–36.0)
MCV: 99.7 fL (ref 80.0–100.0)
Monocytes Absolute: 0.4 K/uL (ref 0.1–1.0)
Monocytes Relative: 7 %
Neutro Abs: 3.3 K/uL (ref 1.7–7.7)
Neutrophils Relative %: 57 %
Platelets: 416 K/uL — ABNORMAL HIGH (ref 150–400)
RBC: 3.9 MIL/uL (ref 3.87–5.11)
RDW: 13.8 % (ref 11.5–15.5)
WBC: 5.7 K/uL (ref 4.0–10.5)
nRBC: 0 % (ref 0.0–0.2)

## 2024-04-28 LAB — URINALYSIS, ROUTINE W REFLEX MICROSCOPIC
Bilirubin Urine: NEGATIVE
Glucose, UA: NEGATIVE mg/dL
Ketones, ur: NEGATIVE mg/dL
Leukocytes,Ua: NEGATIVE
Nitrite: NEGATIVE
Protein, ur: NEGATIVE mg/dL
Specific Gravity, Urine: 1.006 (ref 1.005–1.030)
pH: 8 (ref 5.0–8.0)

## 2024-04-28 LAB — COMPREHENSIVE METABOLIC PANEL WITH GFR
ALT: 12 U/L (ref 0–44)
AST: 16 U/L (ref 15–41)
Albumin: 4.7 g/dL (ref 3.5–5.0)
Alkaline Phosphatase: 69 U/L (ref 38–126)
Anion gap: 10 (ref 5–15)
BUN: 9 mg/dL (ref 6–20)
CO2: 27 mmol/L (ref 22–32)
Calcium: 9.8 mg/dL (ref 8.9–10.3)
Chloride: 102 mmol/L (ref 98–111)
Creatinine, Ser: 0.81 mg/dL (ref 0.44–1.00)
GFR, Estimated: >60 mL/min (ref >60)
Glucose, Bld: 77 mg/dL (ref 70–99)
Potassium: 4.3 mmol/L (ref 3.5–5.1)
Sodium: 139 mmol/L (ref 135–145)
Total Bilirubin: 0.3 mg/dL (ref 0.0–1.2)
Total Protein: 8 g/dL (ref 6.5–8.1)

## 2024-04-28 LAB — TROPONIN T, HIGH SENSITIVITY: Troponin T High Sensitivity: <15 ng/L (ref 0–19)

## 2024-04-28 MED ORDER — METHOCARBAMOL 500 MG PO TABS: 500.0000 mg | ORAL_TABLET | Freq: Three times a day (TID) | ORAL | 0 refills | Status: AC | PRN

## 2024-04-28 MED ORDER — IBUPROFEN 600 MG PO TABS: 600.0000 mg | ORAL_TABLET | Freq: Three times a day (TID) | ORAL | 0 refills | Status: AC | PRN

## 2024-04-28 MED ORDER — OXYCODONE-ACETAMINOPHEN 5-325 MG PO TABS
1.0000 | ORAL_TABLET | Freq: Once | ORAL | Status: AC
  Administered 2024-04-28: 1 via ORAL
  Filled 2024-04-28: qty 1

## 2024-04-28 MED ORDER — IBUPROFEN 400 MG PO TABS
600.0000 mg | ORAL_TABLET | Freq: Once | ORAL | Status: AC
  Administered 2024-04-28: 600 mg via ORAL
  Filled 2024-04-28: qty 2
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
## 2024-04-28 NOTE — ED Triage Notes (Signed)
 Pt arrived via [facility] c/o SOB, and dyspnea with exertion, and Pt reports she was experiencing CP with this as well. Pt also reports productive cough with clear sputum.

## 2024-04-28 NOTE — Discharge Instructions (Signed)
 I have put in a consult to the social worker to see if she can set you up with a primary care doctor.  We are prescribing you some ibuprofen  and a muscle relaxant.

## 2024-05-10 ENCOUNTER — Encounter (HOSPITAL_COMMUNITY): Payer: Self-pay

## 2024-05-10 ENCOUNTER — Other Ambulatory Visit: Payer: Self-pay

## 2024-05-10 ENCOUNTER — Emergency Department (HOSPITAL_COMMUNITY)
Admission: EM | Admit: 2024-05-10 | Discharge: 2024-05-10 | Disposition: A | Discharge status: Home / Self Care | Attending: Emergency Medicine | Admitting: Emergency Medicine

## 2024-05-10 ENCOUNTER — Emergency Department (HOSPITAL_COMMUNITY)

## 2024-05-10 DIAGNOSIS — M549 Dorsalgia, unspecified: Secondary | ICD-10-CM | POA: Diagnosis present

## 2024-05-10 DIAGNOSIS — J45909 Unspecified asthma, uncomplicated: Secondary | ICD-10-CM | POA: Diagnosis not present

## 2024-05-10 DIAGNOSIS — I2510 Atherosclerotic heart disease of native coronary artery without angina pectoris: Secondary | ICD-10-CM | POA: Diagnosis not present

## 2024-05-10 DIAGNOSIS — Z91040 Latex allergy status: Secondary | ICD-10-CM | POA: Diagnosis not present

## 2024-05-10 DIAGNOSIS — K6289 Other specified diseases of anus and rectum: Secondary | ICD-10-CM | POA: Diagnosis not present

## 2024-05-10 DIAGNOSIS — R519 Headache, unspecified: Secondary | ICD-10-CM | POA: Diagnosis not present

## 2024-05-10 LAB — CBC WITH DIFFERENTIAL/PLATELET
Abs Immature Granulocytes: 0.01 K/uL (ref 0.00–0.07)
Basophils Absolute: 0 K/uL (ref 0.0–0.1)
Basophils Relative: 0 %
Eosinophils Absolute: 0.1 K/uL (ref 0.0–0.5)
Eosinophils Relative: 2 %
HCT: 37.1 % (ref 36.0–46.0)
Hemoglobin: 12 g/dL (ref 12.0–15.0)
Immature Granulocytes: 0 %
Lymphocytes Relative: 30 %
Lymphs Abs: 1.6 K/uL (ref 0.7–4.0)
MCH: 32.4 pg (ref 26.0–34.0)
MCHC: 32.3 g/dL (ref 30.0–36.0)
MCV: 100.3 fL — ABNORMAL HIGH (ref 80.0–100.0)
Monocytes Absolute: 0.3 K/uL (ref 0.1–1.0)
Monocytes Relative: 5 %
Neutro Abs: 3.3 K/uL (ref 1.7–7.7)
Neutrophils Relative %: 63 %
Platelets: 345 K/uL (ref 150–400)
RBC: 3.7 MIL/uL — ABNORMAL LOW (ref 3.87–5.11)
RDW: 13.2 % (ref 11.5–15.5)
WBC: 5.3 K/uL (ref 4.0–10.5)
nRBC: 0 % (ref 0.0–0.2)

## 2024-05-10 LAB — URINALYSIS, ROUTINE W REFLEX MICROSCOPIC
Bilirubin Urine: NEGATIVE
Glucose, UA: NEGATIVE mg/dL
Hgb urine dipstick: NEGATIVE
Ketones, ur: NEGATIVE mg/dL
Leukocytes,Ua: NEGATIVE
Nitrite: NEGATIVE
Protein, ur: NEGATIVE mg/dL
Specific Gravity, Urine: 1.015 (ref 1.005–1.030)
pH: 5 (ref 5.0–8.0)

## 2024-05-10 LAB — BASIC METABOLIC PANEL WITH GFR
Anion gap: 9 (ref 5–15)
BUN: 7 mg/dL (ref 6–20)
CO2: 24 mmol/L (ref 22–32)
Calcium: 8.9 mg/dL (ref 8.9–10.3)
Chloride: 107 mmol/L (ref 98–111)
Creatinine, Ser: 0.7 mg/dL (ref 0.44–1.00)
GFR, Estimated: >60 mL/min (ref >60)
Glucose, Bld: 87 mg/dL (ref 70–99)
Potassium: 4 mmol/L (ref 3.5–5.1)
Sodium: 140 mmol/L (ref 135–145)

## 2024-05-10 LAB — PREGNANCY, URINE: Preg Test, Ur: NEGATIVE

## 2024-05-10 MED ORDER — KETOROLAC TROMETHAMINE 15 MG/ML IJ SOLN
15.0000 mg | Freq: Once | INTRAMUSCULAR | Status: AC
  Administered 2024-05-10: 15 mg via INTRAVENOUS
  Filled 2024-05-10: qty 1

## 2024-05-10 MED ORDER — PSYLLIUM 58.6 % PO PACK: 1.0000 | PACK | Freq: Every day | ORAL | 12 refills | Status: AC

## 2024-05-10 MED ORDER — LIDOCAINE (ANORECTAL) 5 % EX GEL: 1.0000 g | Freq: Two times a day (BID) | CUTANEOUS | 0 refills | Status: AC | PRN

## 2024-05-10 MED ORDER — DIPHENHYDRAMINE HCL 50 MG/ML IJ SOLN
12.5000 mg | Freq: Once | INTRAMUSCULAR | Status: AC
  Administered 2024-05-10: 12.5 mg via INTRAVENOUS
  Filled 2024-05-10: qty 1

## 2024-05-10 MED ORDER — METHOCARBAMOL 500 MG PO TABS
1000.0000 mg | ORAL_TABLET | Freq: Once | ORAL | Status: AC
  Administered 2024-05-10: 1000 mg via ORAL
  Filled 2024-05-10: qty 2

## 2024-05-10 MED ORDER — PROCHLORPERAZINE EDISYLATE 10 MG/2ML IJ SOLN
10.0000 mg | Freq: Once | INTRAMUSCULAR | Status: AC
  Administered 2024-05-10: 10 mg via INTRAVENOUS
  Filled 2024-05-10: qty 2

## 2024-05-10 MED ORDER — LACTATED RINGERS IV BOLUS
1000.0000 mL | Freq: Once | INTRAVENOUS | Status: AC
  Administered 2024-05-10: 1000 mL via INTRAVENOUS

## 2024-05-10 MED ORDER — OXYCODONE-ACETAMINOPHEN 5-325 MG PO TABS
1.0000 | ORAL_TABLET | Freq: Once | ORAL | Status: AC
  Administered 2024-05-10: 1 via ORAL
  Filled 2024-05-10: qty 1

## 2024-05-10 MED ORDER — METHOCARBAMOL 500 MG PO TABS: 500.0000 mg | ORAL_TABLET | Freq: Three times a day (TID) | ORAL | 0 refills | Status: AC | PRN
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
## 2024-05-10 NOTE — Discharge Instructions (Addendum)
 Your test results today are reassuring.  Utilize sitz bath's to help with your rectal discomfort.  A prescription for topical lidocaine  was sent to your pharmacy.  Use this as needed.  Take daily fiber supplement such as Metamucil to maintain daily soft stools and avoid further anal discomfort.  Return to the emergency department for any new or worsening symptoms of concern.
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
## 2024-05-10 NOTE — ED Notes (Signed)
 Pt given apple juice with RN permission

## 2024-05-28 ENCOUNTER — Ambulatory Visit
Admission: EM | Admit: 2024-05-28 | Discharge: 2024-05-28 | Disposition: A | Discharge status: Home / Self Care | Attending: Family Medicine | Admitting: Family Medicine

## 2024-05-28 DIAGNOSIS — S8012XA Contusion of left lower leg, initial encounter: Secondary | ICD-10-CM

## 2024-05-28 DIAGNOSIS — Z23 Encounter for immunization: Secondary | ICD-10-CM

## 2024-05-28 DIAGNOSIS — S80812A Abrasion, left lower leg, initial encounter: Secondary | ICD-10-CM | POA: Diagnosis not present

## 2024-05-28 DIAGNOSIS — W540XXA Bitten by dog, initial encounter: Secondary | ICD-10-CM

## 2024-05-28 MED ORDER — BACITRACIN 500 UNIT/GM EX OINT
1.0000 | TOPICAL_OINTMENT | Freq: Once | CUTANEOUS | Status: AC
  Administered 2024-05-28: 1 via TOPICAL

## 2024-05-28 MED ORDER — RABIES VIRUS VACCINE, HDC IM SUSR
1.0000 mL | Freq: Once | INTRAMUSCULAR | Status: AC
  Administered 2024-05-28: 1 mL via INTRAMUSCULAR

## 2024-05-28 MED ORDER — AMOXICILLIN-POT CLAVULANATE 875-125 MG PO TABS: 1.0000 | ORAL_TABLET | Freq: Two times a day (BID) | ORAL | 0 refills | Status: DC

## 2024-05-28 MED ORDER — RABIES IMMUNE GLOBULIN 300 UNIT/2ML IJ SOLN
20.0000 [IU]/kg | Freq: Once | INTRAMUSCULAR | Status: AC
  Administered 2024-05-28: 1200 [IU]

## 2024-05-28 MED ORDER — CHLORHEXIDINE GLUCONATE 4 % EX SOLN: Freq: Every day | CUTANEOUS | 0 refills | Status: AC | PRN

## 2024-05-28 MED ORDER — MUPIROCIN 2 % EX OINT: 1.0000 | TOPICAL_OINTMENT | Freq: Two times a day (BID) | CUTANEOUS | 0 refills | Status: AC

## 2024-05-28 MED ORDER — TETANUS-DIPHTH-ACELL PERTUSSIS 5-2-15.5 LF-MCG/0.5 IM SUSP
0.5000 mL | Freq: Once | INTRAMUSCULAR | Status: AC
  Administered 2024-05-28: 0.5 mL via INTRAMUSCULAR

## 2024-05-28 NOTE — ED Triage Notes (Signed)
 Pt reports dog on the left thigh x 2 days, pt states there is pain and swelling, and bruising, pt is unsure if the dog has vaccinated. Pt has provided information for patient access to contact animal control.

## 2024-05-28 NOTE — ED Notes (Signed)
 CMA cleaned site with Hibiclense and sterile water . Pt tolerated well.   CMA applied bacitracin, nonadherent pad, secured with perforated tap. Pt tolerated well.   RN reviewed site management and infection prevention education. Pt verbalized understanding.

## 2024-05-28 NOTE — Discharge Instructions (Signed)
 Take the full course of antibiotics prescribed.  Clean the area at least once a day with Hibiclens solution and apply the mupirocin  ointment and a nonstick dressing until fully healed.  We have updated your tetanus shot and started the rabies vaccine series today.  Follow-up for worsening or unresolving symptoms
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
## 2024-05-31 ENCOUNTER — Encounter: Payer: Self-pay | Admitting: Emergency Medicine

## 2024-05-31 ENCOUNTER — Ambulatory Visit

## 2024-05-31 ENCOUNTER — Ambulatory Visit
Admission: EM | Admit: 2024-05-31 | Discharge: 2024-05-31 | Disposition: A | Discharge status: Home / Self Care | Attending: Nurse Practitioner | Admitting: Nurse Practitioner

## 2024-05-31 DIAGNOSIS — Z23 Encounter for immunization: Secondary | ICD-10-CM

## 2024-05-31 DIAGNOSIS — Z203 Contact with and (suspected) exposure to rabies: Secondary | ICD-10-CM

## 2024-05-31 MED ORDER — RABIES VIRUS VACCINE, HDC IM SUSR
1.0000 mL | Freq: Once | INTRAMUSCULAR | Status: AC
  Administered 2024-05-31: 1 mL via INTRAMUSCULAR

## 2024-05-31 NOTE — ED Triage Notes (Signed)
Here for 2nd rabies vaccine 

## 2024-06-04 ENCOUNTER — Ambulatory Visit
Admission: EM | Admit: 2024-06-04 | Discharge: 2024-06-04 | Disposition: A | Discharge status: Home / Self Care | Attending: Nurse Practitioner | Admitting: Nurse Practitioner

## 2024-06-04 DIAGNOSIS — Z23 Encounter for immunization: Secondary | ICD-10-CM | POA: Diagnosis not present

## 2024-06-04 MED ORDER — RABIES VIRUS VACCINE, HDC IM SUSR
1.0000 mL | Freq: Once | INTRAMUSCULAR | Status: AC
  Administered 2024-06-04: 1 mL via INTRAMUSCULAR

## 2024-06-04 NOTE — ED Triage Notes (Signed)
 Pt reports to UC for 2nd rabies shots

## 2024-06-11 ENCOUNTER — Other Ambulatory Visit: Payer: Self-pay

## 2024-06-11 ENCOUNTER — Ambulatory Visit
Admission: EM | Admit: 2024-06-11 | Discharge: 2024-06-11 | Disposition: A | Discharge status: Home / Self Care | Attending: Family Medicine | Admitting: Family Medicine

## 2024-06-11 ENCOUNTER — Encounter: Payer: Self-pay | Admitting: Emergency Medicine

## 2024-06-11 ENCOUNTER — Ambulatory Visit

## 2024-06-11 DIAGNOSIS — Z23 Encounter for immunization: Secondary | ICD-10-CM

## 2024-06-11 MED ORDER — RABIES VIRUS VACCINE, HDC IM SUSR
1.0000 mL | Freq: Once | INTRAMUSCULAR | Status: AC
Start: 2024-06-11 — End: 2024-06-11
  Administered 2024-06-11: 1 mL via INTRAMUSCULAR

## 2024-06-11 NOTE — ED Triage Notes (Addendum)
 Pt presents for last dose of rabies vaccine .  Pt denies any pain, new allergies, or changes in medical history since last dose.

## 2024-07-12 ENCOUNTER — Encounter (HOSPITAL_COMMUNITY): Payer: Self-pay

## 2024-07-12 ENCOUNTER — Emergency Department (HOSPITAL_COMMUNITY)
Admission: EM | Admit: 2024-07-12 | Discharge: 2024-07-12 | Disposition: A | Discharge status: Home / Self Care | Attending: Emergency Medicine | Admitting: Emergency Medicine

## 2024-07-12 ENCOUNTER — Emergency Department (HOSPITAL_COMMUNITY)

## 2024-07-12 ENCOUNTER — Other Ambulatory Visit: Payer: Self-pay

## 2024-07-12 DIAGNOSIS — R519 Headache, unspecified: Secondary | ICD-10-CM | POA: Diagnosis present

## 2024-07-12 DIAGNOSIS — R109 Unspecified abdominal pain: Secondary | ICD-10-CM | POA: Diagnosis not present

## 2024-07-12 DIAGNOSIS — Z91040 Latex allergy status: Secondary | ICD-10-CM | POA: Insufficient documentation

## 2024-07-12 DIAGNOSIS — Z91010 Allergy to peanuts: Secondary | ICD-10-CM | POA: Insufficient documentation

## 2024-07-12 DIAGNOSIS — R825 Elevated urine levels of drugs, medicaments and biological substances: Secondary | ICD-10-CM | POA: Insufficient documentation

## 2024-07-12 LAB — URINALYSIS, ROUTINE W REFLEX MICROSCOPIC
Bilirubin Urine: NEGATIVE
Glucose, UA: NEGATIVE mg/dL
Ketones, ur: 5 mg/dL — AB
Leukocytes,Ua: NEGATIVE
Nitrite: NEGATIVE
Protein, ur: NEGATIVE mg/dL
Specific Gravity, Urine: 1.01 (ref 1.005–1.030)
pH: 5 (ref 5.0–8.0)

## 2024-07-12 LAB — COMPREHENSIVE METABOLIC PANEL WITH GFR
ALT: <5 U/L (ref 0–44)
AST: 14 U/L — ABNORMAL LOW (ref 15–41)
Albumin: 4.8 g/dL (ref 3.5–5.0)
Alkaline Phosphatase: 75 U/L (ref 38–126)
Anion gap: 15 (ref 5–15)
BUN: 8 mg/dL (ref 6–20)
CO2: 21 mmol/L — ABNORMAL LOW (ref 22–32)
Calcium: 8.9 mg/dL (ref 8.9–10.3)
Chloride: 102 mmol/L (ref 98–111)
Creatinine, Ser: 0.6 mg/dL (ref 0.44–1.00)
GFR, Estimated: >60 mL/min
Glucose, Bld: 86 mg/dL (ref 70–99)
Potassium: 4.2 mmol/L (ref 3.5–5.1)
Sodium: 138 mmol/L (ref 135–145)
Total Bilirubin: 0.4 mg/dL (ref 0.0–1.2)
Total Protein: 8.1 g/dL (ref 6.5–8.1)

## 2024-07-12 LAB — URINE DRUG SCREEN
Amphetamines: NEGATIVE
Barbiturates: NEGATIVE
Benzodiazepines: NEGATIVE
Cocaine: NEGATIVE
Fentanyl: NEGATIVE
Methadone Scn, Ur: NEGATIVE
Opiates: POSITIVE — AB
Tetrahydrocannabinol: POSITIVE — AB

## 2024-07-12 LAB — CBC WITH DIFFERENTIAL/PLATELET
Abs Immature Granulocytes: 0.02 K/uL (ref 0.00–0.07)
Basophils Absolute: 0 K/uL (ref 0.0–0.1)
Basophils Relative: 0 %
Eosinophils Absolute: 0 K/uL (ref 0.0–0.5)
Eosinophils Relative: 0 %
HCT: 42.2 % (ref 36.0–46.0)
Hemoglobin: 13.8 g/dL (ref 12.0–15.0)
Immature Granulocytes: 0 %
Lymphocytes Relative: 37 %
Lymphs Abs: 2.6 K/uL (ref 0.7–4.0)
MCH: 32.2 pg (ref 26.0–34.0)
MCHC: 32.7 g/dL (ref 30.0–36.0)
MCV: 98.4 fL (ref 80.0–100.0)
Monocytes Absolute: 0.3 K/uL (ref 0.1–1.0)
Monocytes Relative: 4 %
Neutro Abs: 4.1 K/uL (ref 1.7–7.7)
Neutrophils Relative %: 59 %
Platelets: 389 K/uL (ref 150–400)
RBC: 4.29 MIL/uL (ref 3.87–5.11)
RDW: 13.4 % (ref 11.5–15.5)
WBC: 7 K/uL (ref 4.0–10.5)
nRBC: 0 % (ref 0.0–0.2)

## 2024-07-12 LAB — PREGNANCY, URINE: Preg Test, Ur: NEGATIVE

## 2024-07-12 MED ORDER — NAPROXEN 500 MG PO TABS: 500.0000 mg | ORAL_TABLET | Freq: Two times a day (BID) | ORAL | 0 refills | Status: AC

## 2024-07-12 MED ORDER — IOHEXOL 300 MG/ML  SOLN
100.0000 mL | Freq: Once | INTRAMUSCULAR | Status: AC | PRN
  Administered 2024-07-12: 100 mL via INTRAVENOUS

## 2024-07-12 MED ORDER — OXYCODONE-ACETAMINOPHEN 5-325 MG PO TABS
1.0000 | ORAL_TABLET | Freq: Once | ORAL | Status: AC
  Administered 2024-07-12: 1 via ORAL
  Filled 2024-07-12: qty 1
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
## 2024-07-24 ENCOUNTER — Other Ambulatory Visit: Payer: Self-pay

## 2024-07-24 ENCOUNTER — Emergency Department (HOSPITAL_COMMUNITY)
Admission: EM | Admit: 2024-07-24 | Discharge: 2024-07-24 | Disposition: A | Discharge status: Home / Self Care | Attending: Emergency Medicine | Admitting: Emergency Medicine

## 2024-07-24 ENCOUNTER — Encounter (HOSPITAL_COMMUNITY): Payer: Self-pay

## 2024-07-24 DIAGNOSIS — R519 Headache, unspecified: Secondary | ICD-10-CM | POA: Diagnosis present

## 2024-07-24 DIAGNOSIS — J45909 Unspecified asthma, uncomplicated: Secondary | ICD-10-CM | POA: Diagnosis not present

## 2024-07-24 DIAGNOSIS — Z91040 Latex allergy status: Secondary | ICD-10-CM | POA: Insufficient documentation

## 2024-07-24 DIAGNOSIS — G44309 Post-traumatic headache, unspecified, not intractable: Secondary | ICD-10-CM | POA: Diagnosis not present

## 2024-07-24 DIAGNOSIS — Z91010 Allergy to peanuts: Secondary | ICD-10-CM | POA: Insufficient documentation

## 2024-07-24 MED ORDER — BUTALBITAL-APAP-CAFFEINE 50-325-40 MG PO TABS: 1.0000 | ORAL_TABLET | Freq: Four times a day (QID) | ORAL | 0 refills | Status: AC | PRN

## 2024-07-24 MED ORDER — KETOROLAC TROMETHAMINE 15 MG/ML IJ SOLN
15.0000 mg | Freq: Once | INTRAMUSCULAR | Status: AC
  Administered 2024-07-24: 15 mg via INTRAMUSCULAR
  Filled 2024-07-24: qty 1

## 2024-07-24 MED ORDER — DIPHENHYDRAMINE HCL 50 MG/ML IJ SOLN
25.0000 mg | Freq: Once | INTRAMUSCULAR | Status: AC
  Administered 2024-07-24: 25 mg via INTRAVENOUS
  Filled 2024-07-24: qty 1

## 2024-07-24 MED ORDER — BUTALBITAL-APAP-CAFFEINE 50-325-40 MG PO TABS
2.0000 | ORAL_TABLET | Freq: Once | ORAL | Status: AC
  Administered 2024-07-24: 2 via ORAL
  Filled 2024-07-24: qty 2

## 2024-07-24 MED ORDER — LIDOCAINE 5 % EX PTCH
2.0000 | MEDICATED_PATCH | CUTANEOUS | Status: DC
  Administered 2024-07-24: 2 via TRANSDERMAL
  Filled 2024-07-24: qty 2

## 2024-07-24 MED ORDER — SODIUM CHLORIDE 0.9 % IV BOLUS
1000.0000 mL | Freq: Once | INTRAVENOUS | Status: AC
  Administered 2024-07-24: 1000 mL via INTRAVENOUS

## 2024-07-24 MED ORDER — PROCHLORPERAZINE EDISYLATE 10 MG/2ML IJ SOLN
10.0000 mg | Freq: Once | INTRAMUSCULAR | Status: AC
  Administered 2024-07-24: 10 mg via INTRAVENOUS
  Filled 2024-07-24: qty 2
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
## 2024-08-02 ENCOUNTER — Encounter (HOSPITAL_COMMUNITY): Payer: Self-pay

## 2024-08-02 ENCOUNTER — Other Ambulatory Visit: Payer: Self-pay

## 2024-08-02 ENCOUNTER — Emergency Department (HOSPITAL_COMMUNITY)

## 2024-08-02 ENCOUNTER — Emergency Department (HOSPITAL_COMMUNITY)
Admission: EM | Admit: 2024-08-02 | Discharge: 2024-08-02 | Disposition: A | Discharge status: Home / Self Care | Attending: Emergency Medicine | Admitting: Emergency Medicine

## 2024-08-02 DIAGNOSIS — Z91040 Latex allergy status: Secondary | ICD-10-CM | POA: Diagnosis not present

## 2024-08-02 DIAGNOSIS — R519 Headache, unspecified: Secondary | ICD-10-CM | POA: Insufficient documentation

## 2024-08-02 DIAGNOSIS — N898 Other specified noninflammatory disorders of vagina: Secondary | ICD-10-CM | POA: Diagnosis not present

## 2024-08-02 DIAGNOSIS — R300 Dysuria: Secondary | ICD-10-CM | POA: Diagnosis not present

## 2024-08-02 DIAGNOSIS — A599 Trichomoniasis, unspecified: Secondary | ICD-10-CM | POA: Diagnosis not present

## 2024-08-02 DIAGNOSIS — R10A2 Flank pain, left side: Secondary | ICD-10-CM

## 2024-08-02 DIAGNOSIS — Z79899 Other long term (current) drug therapy: Secondary | ICD-10-CM | POA: Diagnosis not present

## 2024-08-02 DIAGNOSIS — D72829 Elevated white blood cell count, unspecified: Secondary | ICD-10-CM | POA: Diagnosis not present

## 2024-08-02 DIAGNOSIS — R1032 Left lower quadrant pain: Secondary | ICD-10-CM | POA: Diagnosis present

## 2024-08-02 LAB — CBC
HCT: 37.9 % (ref 36.0–46.0)
Hemoglobin: 12.5 g/dL (ref 12.0–15.0)
MCH: 32.7 pg (ref 26.0–34.0)
MCHC: 33 g/dL (ref 30.0–36.0)
MCV: 99.2 fL (ref 80.0–100.0)
Platelets: 409 K/uL — ABNORMAL HIGH (ref 150–400)
RBC: 3.82 MIL/uL — ABNORMAL LOW (ref 3.87–5.11)
RDW: 13.3 % (ref 11.5–15.5)
WBC: 5.8 K/uL (ref 4.0–10.5)
nRBC: 0 % (ref 0.0–0.2)

## 2024-08-02 LAB — COMPREHENSIVE METABOLIC PANEL WITH GFR
ALT: 8 U/L (ref 0–44)
AST: 20 U/L (ref 15–41)
Albumin: 4.3 g/dL (ref 3.5–5.0)
Alkaline Phosphatase: 72 U/L (ref 38–126)
Anion gap: 11 (ref 5–15)
BUN: 14 mg/dL (ref 6–20)
CO2: 27 mmol/L (ref 22–32)
Calcium: 8.9 mg/dL (ref 8.9–10.3)
Chloride: 106 mmol/L (ref 98–111)
Creatinine, Ser: 0.77 mg/dL (ref 0.44–1.00)
GFR, Estimated: >60 mL/min
Glucose, Bld: 86 mg/dL (ref 70–99)
Potassium: 3.6 mmol/L (ref 3.5–5.1)
Sodium: 143 mmol/L (ref 135–145)
Total Bilirubin: 0.2 mg/dL (ref 0.0–1.2)
Total Protein: 7.2 g/dL (ref 6.5–8.1)

## 2024-08-02 LAB — URINALYSIS, ROUTINE W REFLEX MICROSCOPIC
Bilirubin Urine: NEGATIVE
Glucose, UA: NEGATIVE mg/dL
Hgb urine dipstick: NEGATIVE
Ketones, ur: NEGATIVE mg/dL
Nitrite: NEGATIVE
Protein, ur: 30 mg/dL — AB
Specific Gravity, Urine: 1.027 (ref 1.005–1.030)
pH: 5 (ref 5.0–8.0)

## 2024-08-02 LAB — WET PREP, GENITAL
Sperm: NONE SEEN
WBC, Wet Prep HPF POC: <10
Yeast Wet Prep HPF POC: NONE SEEN

## 2024-08-02 LAB — PREGNANCY, URINE: Preg Test, Ur: NEGATIVE

## 2024-08-02 LAB — LIPASE, BLOOD: Lipase: 44 U/L (ref 11–51)

## 2024-08-02 MED ORDER — KETOROLAC TROMETHAMINE 30 MG/ML IJ SOLN
30.0000 mg | Freq: Once | INTRAMUSCULAR | Status: AC
  Administered 2024-08-02: 30 mg via INTRAMUSCULAR
  Filled 2024-08-02: qty 1

## 2024-08-02 MED ORDER — METRONIDAZOLE 500 MG PO TABS
500.0000 mg | ORAL_TABLET | Freq: Once | ORAL | Status: AC
  Administered 2024-08-02: 500 mg via ORAL
  Filled 2024-08-02: qty 1

## 2024-08-02 MED ORDER — LIDOCAINE HCL (PF) 1 % IJ SOLN
INTRAMUSCULAR | Status: AC
  Administered 2024-08-02: 2 mL
  Filled 2024-08-02: qty 2

## 2024-08-02 MED ORDER — DIPHENHYDRAMINE HCL 25 MG PO CAPS
25.0000 mg | ORAL_CAPSULE | Freq: Once | ORAL | Status: AC
  Administered 2024-08-02: 25 mg via ORAL
  Filled 2024-08-02: qty 1

## 2024-08-02 MED ORDER — METRONIDAZOLE 500 MG PO TABS: 500.0000 mg | ORAL_TABLET | Freq: Two times a day (BID) | ORAL | 0 refills | Status: AC

## 2024-08-02 MED ORDER — CEFTRIAXONE SODIUM 500 MG IJ SOLR
500.0000 mg | Freq: Once | INTRAMUSCULAR | Status: AC
  Administered 2024-08-02: 500 mg via INTRAMUSCULAR
  Filled 2024-08-02: qty 500

## 2024-08-02 MED ORDER — OXYCODONE-ACETAMINOPHEN 5-325 MG PO TABS
1.0000 | ORAL_TABLET | Freq: Once | ORAL | Status: AC
  Administered 2024-08-02: 1 via ORAL
  Filled 2024-08-02: qty 1
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
## 2024-08-02 NOTE — ED Triage Notes (Signed)
 Pt arrived via POV c/o bilateral back pain that radiates to her groin and reports Hx of kidney stones. Pt denies injury.

## 2024-08-02 NOTE — Discharge Instructions (Addendum)
 You have been treated here with medication for your headache and an antibiotic called Flagyl  for trichomoniasis infection.  I recommend that you take over-the-counter Benadryl , 1 capsule 30 minutes prior to taking your Flagyl  prescription.  Please follow-up with your primary care provider or with your OB/GYN for recheck.  Stop the flagyl  immediately and return to ER if you develop a severe rash, shortness or breath or swelling.

## 2024-08-03 LAB — URINE CULTURE

## 2024-08-03 LAB — GC/CHLAMYDIA PROBE AMP (~~LOC~~) NOT AT ARMC
Chlamydia: NEGATIVE
Comment: NEGATIVE
Comment: NORMAL
Neisseria Gonorrhea: NEGATIVE

## 2024-08-05 ENCOUNTER — Emergency Department (HOSPITAL_COMMUNITY)

## 2024-08-05 ENCOUNTER — Emergency Department (HOSPITAL_COMMUNITY)
Admission: EM | Admit: 2024-08-05 | Discharge: 2024-08-05 | Disposition: A | Discharge status: Home / Self Care | Attending: Emergency Medicine | Admitting: Emergency Medicine

## 2024-08-05 DIAGNOSIS — Z91010 Allergy to peanuts: Secondary | ICD-10-CM | POA: Insufficient documentation

## 2024-08-05 DIAGNOSIS — R1084 Generalized abdominal pain: Secondary | ICD-10-CM | POA: Diagnosis not present

## 2024-08-05 DIAGNOSIS — M542 Cervicalgia: Secondary | ICD-10-CM | POA: Diagnosis not present

## 2024-08-05 DIAGNOSIS — R519 Headache, unspecified: Secondary | ICD-10-CM | POA: Insufficient documentation

## 2024-08-05 DIAGNOSIS — Z91040 Latex allergy status: Secondary | ICD-10-CM | POA: Insufficient documentation

## 2024-08-05 LAB — URINE DRUG SCREEN
Amphetamines: NEGATIVE
Barbiturates: NEGATIVE
Benzodiazepines: NEGATIVE
Cocaine: NEGATIVE
Fentanyl: NEGATIVE
Methadone Scn, Ur: NEGATIVE
Opiates: POSITIVE — AB
Tetrahydrocannabinol: POSITIVE — AB

## 2024-08-05 LAB — TROPONIN T, HIGH SENSITIVITY
Troponin T High Sensitivity: <6 ng/L (ref 0–19)
Troponin T High Sensitivity: <6 ng/L (ref 0–19)

## 2024-08-05 LAB — COMPREHENSIVE METABOLIC PANEL WITH GFR
ALT: 9 U/L (ref 0–44)
AST: 12 U/L — ABNORMAL LOW (ref 15–41)
Albumin: 4.5 g/dL (ref 3.5–5.0)
Alkaline Phosphatase: 73 U/L (ref 38–126)
Anion gap: 12 (ref 5–15)
BUN: 8 mg/dL (ref 6–20)
CO2: 27 mmol/L (ref 22–32)
Calcium: 9 mg/dL (ref 8.9–10.3)
Chloride: 103 mmol/L (ref 98–111)
Creatinine, Ser: 0.8 mg/dL (ref 0.44–1.00)
GFR, Estimated: >60 mL/min
Glucose, Bld: 97 mg/dL (ref 70–99)
Potassium: 3.6 mmol/L (ref 3.5–5.1)
Sodium: 142 mmol/L (ref 135–145)
Total Bilirubin: <0.2 mg/dL (ref 0.0–1.2)
Total Protein: 7.3 g/dL (ref 6.5–8.1)

## 2024-08-05 LAB — CBC WITH DIFFERENTIAL/PLATELET
Abs Immature Granulocytes: 0.01 K/uL (ref 0.00–0.07)
Basophils Absolute: 0 K/uL (ref 0.0–0.1)
Basophils Relative: 0 %
Eosinophils Absolute: 0.1 K/uL (ref 0.0–0.5)
Eosinophils Relative: 1 %
HCT: 37.8 % (ref 36.0–46.0)
Hemoglobin: 12.2 g/dL (ref 12.0–15.0)
Immature Granulocytes: 0 %
Lymphocytes Relative: 19 %
Lymphs Abs: 1.5 K/uL (ref 0.7–4.0)
MCH: 32.1 pg (ref 26.0–34.0)
MCHC: 32.3 g/dL (ref 30.0–36.0)
MCV: 99.5 fL (ref 80.0–100.0)
Monocytes Absolute: 0.2 K/uL (ref 0.1–1.0)
Monocytes Relative: 3 %
Neutro Abs: 6.1 K/uL (ref 1.7–7.7)
Neutrophils Relative %: 77 %
Platelets: 374 K/uL (ref 150–400)
RBC: 3.8 MIL/uL — ABNORMAL LOW (ref 3.87–5.11)
RDW: 13.2 % (ref 11.5–15.5)
WBC: 8 K/uL (ref 4.0–10.5)
nRBC: 0 % (ref 0.0–0.2)

## 2024-08-05 LAB — URINALYSIS, ROUTINE W REFLEX MICROSCOPIC
Bilirubin Urine: NEGATIVE
Glucose, UA: NEGATIVE mg/dL
Hgb urine dipstick: NEGATIVE
Ketones, ur: NEGATIVE mg/dL
Leukocytes,Ua: NEGATIVE
Nitrite: NEGATIVE
Protein, ur: NEGATIVE mg/dL
Specific Gravity, Urine: 1.002 — ABNORMAL LOW (ref 1.005–1.030)
pH: 6 (ref 5.0–8.0)

## 2024-08-05 LAB — HCG, QUANTITATIVE, PREGNANCY: hCG, Beta Chain, Quant, S: 1 m[IU]/mL

## 2024-08-05 MED ORDER — IOHEXOL 300 MG/ML  SOLN
100.0000 mL | Freq: Once | INTRAMUSCULAR | Status: AC | PRN
  Administered 2024-08-05: 100 mL via INTRAVENOUS

## 2024-08-05 MED ORDER — SODIUM CHLORIDE 0.9 % IV BOLUS
1000.0000 mL | Freq: Once | INTRAVENOUS | Status: AC
  Administered 2024-08-05: 1000 mL via INTRAVENOUS

## 2024-08-05 MED ORDER — KETOROLAC TROMETHAMINE 15 MG/ML IJ SOLN
15.0000 mg | Freq: Once | INTRAMUSCULAR | Status: AC
  Administered 2024-08-05: 15 mg via INTRAVENOUS
  Filled 2024-08-05: qty 1

## 2024-08-05 MED ORDER — ONDANSETRON HCL 4 MG/2ML IJ SOLN
4.0000 mg | Freq: Once | INTRAMUSCULAR | Status: AC
  Administered 2024-08-05: 4 mg via INTRAVENOUS
  Filled 2024-08-05: qty 2

## 2024-08-05 MED ORDER — HYDROMORPHONE HCL 1 MG/ML IJ SOLN
1.0000 mg | Freq: Once | INTRAMUSCULAR | Status: AC
  Administered 2024-08-05: 1 mg via INTRAVENOUS
  Filled 2024-08-05: qty 1

## 2024-08-05 NOTE — Discharge Instructions (Signed)
 Follow-up with your primary care provider.  If you develop fever, new or worsening headache, neck stiffness, vomiting, abdominal pain, chest pain, or any other new/concerning symptoms then return to the ER.
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
